# Patient Record
Sex: Female | Born: 1975 | Race: White | Hispanic: No | Marital: Married | Smoking: Never smoker
Health system: Southern US, Community
[De-identification: ages and names within clinical notes are randomized; demographics above are authoritative.]

## PROBLEM LIST (undated history)

## (undated) DIAGNOSIS — L309 Dermatitis, unspecified: Secondary | ICD-10-CM

## (undated) DIAGNOSIS — F329 Major depressive disorder, single episode, unspecified: Secondary | ICD-10-CM

## (undated) DIAGNOSIS — F32A Depression, unspecified: Secondary | ICD-10-CM

## (undated) DIAGNOSIS — M779 Enthesopathy, unspecified: Secondary | ICD-10-CM

## (undated) DIAGNOSIS — L710 Perioral dermatitis: Secondary | ICD-10-CM

## (undated) HISTORY — DX: Dermatitis, unspecified: L30.9

## (undated) HISTORY — DX: Depression, unspecified: F32.A

## (undated) HISTORY — DX: Major depressive disorder, single episode, unspecified: F32.9

## (undated) HISTORY — DX: Enthesopathy, unspecified: M77.9

## (undated) HISTORY — DX: Perioral dermatitis: L71.0

---

## 2006-06-12 ENCOUNTER — Inpatient Hospital Stay: Payer: Self-pay | Admitting: Obstetrics and Gynecology

## 2009-03-11 ENCOUNTER — Ambulatory Visit: Payer: Self-pay | Admitting: Family Medicine

## 2009-03-11 LAB — CONVERTED CEMR LAB
Basophils Absolute: 0 10*3/uL (ref 0.0–0.1)
Eosinophils Absolute: 0.2 10*3/uL (ref 0.0–0.7)
Eosinophils Relative: 2 % (ref 0–5)
HCT: 37.6 % (ref 36.0–46.0)
Hemoglobin: 11.9 g/dL — ABNORMAL LOW (ref 12.0–15.0)
MCV: 93.1 fL (ref 78.0–100.0)
Monocytes Absolute: 0.9 10*3/uL (ref 0.1–1.0)
Monocytes Relative: 10 % (ref 3–12)
Neutro Abs: 7.2 10*3/uL (ref 1.7–7.7)
Neutrophils Relative %: 76 % (ref 43–77)
WBC: 9.5 10*3/uL (ref 4.0–10.5)

## 2009-03-14 ENCOUNTER — Encounter: Payer: Self-pay | Admitting: Obstetrics & Gynecology

## 2009-03-14 ENCOUNTER — Ambulatory Visit: Payer: Self-pay | Admitting: Obstetrics & Gynecology

## 2009-03-15 ENCOUNTER — Encounter: Payer: Self-pay | Admitting: Obstetrics & Gynecology

## 2009-03-15 LAB — CONVERTED CEMR LAB

## 2009-03-18 ENCOUNTER — Ambulatory Visit (HOSPITAL_COMMUNITY): Admission: RE | Admit: 2009-03-18 | Discharge: 2009-03-18 | Payer: Self-pay | Admitting: Family Medicine

## 2009-03-18 ENCOUNTER — Encounter: Payer: Self-pay | Admitting: Family Medicine

## 2009-04-04 ENCOUNTER — Ambulatory Visit: Payer: Self-pay | Admitting: Obstetrics & Gynecology

## 2009-04-23 ENCOUNTER — Encounter: Payer: Self-pay | Admitting: Family Medicine

## 2009-04-23 ENCOUNTER — Ambulatory Visit (HOSPITAL_COMMUNITY): Admission: RE | Admit: 2009-04-23 | Discharge: 2009-04-23 | Payer: Self-pay | Admitting: Obstetrics & Gynecology

## 2009-05-01 ENCOUNTER — Ambulatory Visit: Payer: Self-pay | Admitting: Obstetrics & Gynecology

## 2009-05-20 ENCOUNTER — Ambulatory Visit: Payer: Self-pay | Admitting: Obstetrics and Gynecology

## 2009-05-29 ENCOUNTER — Ambulatory Visit (HOSPITAL_COMMUNITY): Admission: RE | Admit: 2009-05-29 | Discharge: 2009-05-29 | Payer: Self-pay | Admitting: Obstetrics & Gynecology

## 2009-06-12 ENCOUNTER — Ambulatory Visit: Payer: Self-pay | Admitting: Obstetrics & Gynecology

## 2009-07-10 ENCOUNTER — Ambulatory Visit: Payer: Self-pay | Admitting: Family Medicine

## 2009-07-11 ENCOUNTER — Encounter: Payer: Self-pay | Admitting: Family Medicine

## 2009-07-11 LAB — CONVERTED CEMR LAB

## 2009-07-25 ENCOUNTER — Ambulatory Visit (HOSPITAL_COMMUNITY): Admission: RE | Admit: 2009-07-25 | Discharge: 2009-07-25 | Payer: Self-pay | Admitting: Obstetrics & Gynecology

## 2009-08-05 ENCOUNTER — Ambulatory Visit: Payer: Self-pay | Admitting: Obstetrics and Gynecology

## 2009-08-27 ENCOUNTER — Ambulatory Visit: Payer: Self-pay | Admitting: Obstetrics & Gynecology

## 2009-09-09 ENCOUNTER — Ambulatory Visit: Payer: Self-pay | Admitting: Obstetrics and Gynecology

## 2009-09-10 ENCOUNTER — Encounter: Payer: Self-pay | Admitting: Obstetrics and Gynecology

## 2009-09-10 LAB — CONVERTED CEMR LAB: Clue Cells Wet Prep HPF POC: NONE SEEN

## 2009-09-11 ENCOUNTER — Ambulatory Visit (HOSPITAL_COMMUNITY): Admission: RE | Admit: 2009-09-11 | Discharge: 2009-09-11 | Payer: Self-pay | Admitting: Obstetrics and Gynecology

## 2009-09-18 ENCOUNTER — Ambulatory Visit: Payer: Self-pay | Admitting: Obstetrics & Gynecology

## 2009-10-02 ENCOUNTER — Ambulatory Visit: Payer: Self-pay | Admitting: Family Medicine

## 2009-10-03 ENCOUNTER — Encounter: Payer: Self-pay | Admitting: Family Medicine

## 2009-10-03 LAB — CONVERTED CEMR LAB
Chlamydia, DNA Probe: NEGATIVE
GC Probe Amp, Genital: NEGATIVE

## 2009-10-09 ENCOUNTER — Ambulatory Visit: Payer: Self-pay | Admitting: Obstetrics and Gynecology

## 2009-10-16 ENCOUNTER — Ambulatory Visit: Payer: Self-pay | Admitting: Obstetrics & Gynecology

## 2009-10-21 ENCOUNTER — Ambulatory Visit: Payer: Self-pay | Admitting: Family Medicine

## 2009-12-02 ENCOUNTER — Encounter: Payer: Self-pay | Admitting: Family Medicine

## 2009-12-02 ENCOUNTER — Ambulatory Visit: Payer: Self-pay | Admitting: Obstetrics and Gynecology

## 2009-12-02 LAB — CONVERTED CEMR LAB
HCT: 37.3 % (ref 36.0–46.0)
WBC: 6.6 10*3/uL (ref 4.0–10.5)

## 2010-01-15 ENCOUNTER — Encounter: Payer: Self-pay | Admitting: Family Medicine

## 2010-05-29 ENCOUNTER — Inpatient Hospital Stay (HOSPITAL_COMMUNITY): Admission: AD | Admit: 2010-05-29 | Discharge: 2009-10-23 | Payer: Self-pay | Admitting: Obstetrics and Gynecology

## 2010-06-22 DIAGNOSIS — L309 Dermatitis, unspecified: Secondary | ICD-10-CM

## 2010-06-22 HISTORY — DX: Dermatitis, unspecified: L30.9

## 2010-07-13 ENCOUNTER — Encounter: Payer: Self-pay | Admitting: Obstetrics & Gynecology

## 2010-07-14 ENCOUNTER — Encounter: Payer: Self-pay | Admitting: Family Medicine

## 2010-07-22 NOTE — Letter (Signed)
Summary: HEALTH CERTIFICATE  HEALTH CERTIFICATE   Imported By: Lind Guest 01/16/2010 14:24:56  _____________________________________________________________________  External Attachment:    Type:   Image     Comment:   External Document

## 2010-09-09 LAB — CBC
HCT: 29 % — ABNORMAL LOW (ref 36.0–46.0)
Hemoglobin: 9.7 g/dL — ABNORMAL LOW (ref 12.0–15.0)
MCHC: 33.3 g/dL (ref 30.0–36.0)
MCV: 87.2 fL (ref 78.0–100.0)
RBC: 3.32 MIL/uL — ABNORMAL LOW (ref 3.87–5.11)
RDW: 14.5 % (ref 11.5–15.5)
WBC: 14.2 10*3/uL — ABNORMAL HIGH (ref 4.0–10.5)

## 2010-09-09 LAB — RPR: RPR Ser Ql: NONREACTIVE

## 2010-11-04 NOTE — Assessment & Plan Note (Signed)
NAME:  HISAE, DECOURSEY NO.:  [id number]   MEDICAL RECORD NO.:  [id number]          PATIENT TYPE:  POB   LOCATION:  [facility]         FACILITY:  [facility]   PHYSICIAN:  Argentina Donovan, MD        DATE OF BIRTH:  May 27, 1976   DATE OF SERVICE:  12/02/2009                                  CLINIC NOTE   The patient is a 35 year old gravida 2, para 1-1-0-2 Caucasian female  who delivered on Oct 21, 2009, a female infant, full-term, non-complicated  spontaneous delivery, small perineal sulcus tears that had to be  sutured.  The patient is nursing, has a history of past depression but  is feeling well and is not in any antidepressant at this present time.  She was given some oral contraceptives when she left the hospital, but  she has not started them yet.  She is already gotten the prescription  filled, however and she was unsure what they were and I am not sure  whether they gave her pure progesterone or combination pill, so she is  going to check on that and if she has any question, she will call.  I  told her that at this point, she is better off not taking pure  progesterone as she stops nursing, the failure rate goes way up.  She  seems to understand that.  She is going to follow-through.  A Pap smear  was done today and we have told the patient that since she has had so  many normal ones that once every 3 years at this point should be  adequate.   The breasts are lactating, no dominant masses.  No nipple discharge.  Abdomen is soft, flat, nontender.  No masses or organomegaly.  External  genitalia is normal.  BUS within normal limits.  Vagina is clean and  well rugated.  The area I mentioned where the lacerations were are well-  healed.  No sign of scarring.  Cervix is clean, parous.  Pap smear was  taken.  The uterus is well involuted.  Normal size, shape, and  consistency and the adnexa is normal.  The patient today weighs 163, 5  feet 6 inches tall, blood  pressure is 98/60 with pulse of 87.  She is  feeling well postpartum and is doing well.           ______________________________  Argentina Donovan, MD     PR/MEDQ  D:  12/02/2009  T:  12/03/2009  Job:  [id number]

## 2011-04-08 ENCOUNTER — Encounter: Payer: Self-pay | Admitting: Family Medicine

## 2011-04-08 ENCOUNTER — Ambulatory Visit (INDEPENDENT_AMBULATORY_CARE_PROVIDER_SITE_OTHER): Payer: Commercial Indemnity | Admitting: Family Medicine

## 2011-04-08 VITALS — BP 86/50 | HR 96 | Ht 67.0 in | Wt 156.0 lb

## 2011-04-08 DIAGNOSIS — IMO0001 Reserved for inherently not codable concepts without codable children: Secondary | ICD-10-CM

## 2011-04-08 DIAGNOSIS — R350 Frequency of micturition: Secondary | ICD-10-CM

## 2011-04-08 DIAGNOSIS — Z23 Encounter for immunization: Secondary | ICD-10-CM

## 2011-04-08 DIAGNOSIS — Z803 Family history of malignant neoplasm of breast: Secondary | ICD-10-CM | POA: Insufficient documentation

## 2011-04-08 DIAGNOSIS — Z309 Encounter for contraceptive management, unspecified: Secondary | ICD-10-CM

## 2011-04-08 DIAGNOSIS — N912 Amenorrhea, unspecified: Secondary | ICD-10-CM

## 2011-04-08 LAB — POCT URINALYSIS DIPSTICK
Blood, UA: NEGATIVE
Leukocytes, UA: NEGATIVE
Nitrite, UA: NEGATIVE
Protein, UA: NEGATIVE
Urobilinogen, UA: NEGATIVE
pH, UA: NEGATIVE

## 2011-04-08 MED ORDER — NORETHINDRONE-ETH ESTRADIOL 0.4-35 MG-MCG PO TABS: 1.0000 | ORAL_TABLET | Freq: Every day | ORAL | Status: DC

## 2011-04-08 NOTE — Progress Notes (Addendum)
Patient is here today because she has not had a period since August.  She is usually very regular and heavy even with the ocp that she is taking.  She is on some medication that she needs to be sure she is not pregnant, she has had 3 negative home pregnancy tests.  She has also had frequent headaches and increased urination.  She has also had episodes of restless leg which only happens when she is pregnant.  She also has an itchy rash on her inner thigh for two weeks.  She has a Negative upt here today as well as a negative urine dip.

## 2011-04-08 NOTE — Patient Instructions (Signed)
Secondary Amenorrhea  Temporarily Absent Periods Secondary amenorrhea is the stopping of menstrual flow for 3 to 6 months in a female who has previously had periods. There are many possible causes. Most of these causes are not serious. Usually treating the underlying problem causing the loss of menses will return your periods to normal. CAUSES Some common and uncommon causes of not menstruating include:  Malnutrition  Hypoglycemia   Polycystic ovarian disease   Stress or fear.   Breastfeeding   Hormone imbalance   Ovarian failure.   Medications  Extreme obesity   Cystic fibrosis   Drastic weight reduction from any cause or low body weight.   Early menopause   Removal of ovaries or uterus.   Contraceptives  Illness   Long term (chronic) illnesses   Cushing's disease   Thyroid problems   Birth control pills, patches, or vaginal rings for birth control.   DIAGNOSIS This diagnosis is made by your caregiver taking a medical history and doing a physical exam. Pregnancy must be ruled out. Often times, numerous blood tests of different hormones in the body may be measured. Urine testing may be done. Specialized x-rays may have to be done as well as measuring the Body Mass Index (BMI). If it is less than 20, the hypothalamus may be the problem. If it is greater than 30, the cause may be diabetes mellitus.  TREATMENT Treatment depends on the cause of the amenorrhea. If an eating disorder is present, this can be treated with an adequate diet and therapy. Chronic illnesses may improve with treatment of the illness. Overall, the outlook is good, and the amenorrhea may be corrected with medications, lifestyle changes, or surgery. If the amenorrhea can not be corrected, it is sometimes possible to create a false menstruation with medications which help women feel more normal. Should emotional problems occur, your caregiver will be able to help you.  Document Released: 07/20/2006  Document Re-Released: 09/02/2009 ExitCare Patient Information 2011 [employer]. 

## 2011-04-08 NOTE — Progress Notes (Signed)
  Subjective:    Patient ID: Tammy Stout, female    DOB: 22-Sep-1975, 35 y.o.   MRN: [id number]  HPI Has been on OC's x > 1 yr.  Very low dose.  No cycle x 2 mos.  Neg UPT at home.   Review of Systems  Constitutional: Negative for activity change, appetite change and fatigue.  Genitourinary: Positive for menstrual problem. Negative for hematuria, flank pain and pelvic pain.  Musculoskeletal: Negative for arthralgias.       Objective:   Physical Exam  Vitals reviewed. Constitutional: She appears well-developed and well-nourished.  HENT:  Head: Normocephalic and atraumatic.  Cardiovascular: Normal rate.   Pulmonary/Chest: Effort normal.  Abdominal: Soft.  Genitourinary: Vagina normal and uterus normal. No vaginal discharge found.          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  Will increase dose of OC's.  Return in 3 mos. If no cycle. Family h/o breast cancer in her mother-age 30--needs screening.

## 2011-04-09 LAB — TSH: TSH: 1.929 u[IU]/mL (ref 0.350–4.500)

## 2011-04-10 ENCOUNTER — Telehealth: Payer: Self-pay | Admitting: *Deleted

## 2011-04-10 NOTE — Telephone Encounter (Signed)
Message copied by Barbara Cower on Fri Apr 10, 2011  8:14 AM ------      Message from: Reva Bores      Created: Thu Apr 09, 2011  9:13 AM       TSH is nml, please call pt. With results.

## 2011-04-10 NOTE — Telephone Encounter (Signed)
Advised patient of normal results

## 2012-06-25 ENCOUNTER — Ambulatory Visit: Payer: Self-pay | Admitting: Internal Medicine

## 2012-06-25 ENCOUNTER — Emergency Department: Payer: Self-pay | Admitting: Emergency Medicine

## 2012-06-25 LAB — CBC
MCV: 89 fL (ref 80–100)
RBC: 4.3 10*6/uL (ref 3.80–5.20)
RDW: 13.4 % (ref 11.5–14.5)

## 2012-06-25 LAB — COMPREHENSIVE METABOLIC PANEL
Alkaline Phosphatase: 87 U/L (ref 50–136)
Anion Gap: 6 — ABNORMAL LOW (ref 7–16)
Chloride: 103 mmol/L (ref 98–107)
Creatinine: 0.67 mg/dL (ref 0.60–1.30)
EGFR (African American): >60
Glucose: 87 mg/dL (ref 65–99)

## 2012-06-25 LAB — URINALYSIS, COMPLETE
Bilirubin,UR: NEGATIVE
Blood: NEGATIVE
Ph: 6 (ref 4.5–8.0)
WBC UR: 2 /HPF (ref 0–5)

## 2012-06-25 LAB — PREGNANCY, URINE: Pregnancy Test, Urine: NEGATIVE m[IU]/mL

## 2012-06-25 LAB — TROPONIN I: Troponin-I: <0.02 ng/mL

## 2012-07-22 LAB — HM PAP SMEAR

## 2012-09-27 ENCOUNTER — Encounter: Payer: Self-pay | Admitting: Family Medicine

## 2012-09-27 ENCOUNTER — Ambulatory Visit (INDEPENDENT_AMBULATORY_CARE_PROVIDER_SITE_OTHER): Payer: Commercial Indemnity | Admitting: Family Medicine

## 2012-09-27 VITALS — BP 98/60 | HR 104 | Ht 67.0 in | Wt 168.0 lb

## 2012-09-27 DIAGNOSIS — Z309 Encounter for contraceptive management, unspecified: Secondary | ICD-10-CM

## 2012-09-27 DIAGNOSIS — IMO0001 Reserved for inherently not codable concepts without codable children: Secondary | ICD-10-CM | POA: Insufficient documentation

## 2012-09-27 NOTE — Progress Notes (Signed)
Here today to discuss [facility] options.  Currently on oral medication.

## 2012-09-27 NOTE — Progress Notes (Signed)
  Subjective:    Patient ID: Princes Finger, female    DOB: 06-28-1975, 37 y.o.   MRN: [id number]  HPI  On Oc's.  Unhappy with them, because sometimes she does not have cycle.  She is uninterested in having more kids.  She would like to have a hormone free method, as she thinks her weight will be better if not on hormones.   Review of Systems  Constitutional: Negative for fever and chills.  Gastrointestinal: Negative for abdominal pain.  Genitourinary: Negative for dysuria and menstrual problem.       Objective:   Physical Exam  Vitals reviewed. Constitutional: She appears well-developed and well-nourished.  HENT:  Head: Normocephalic and atraumatic.  Eyes: No scleral icterus.  Neck: Neck supple.  Cardiovascular: Normal rate.   Pulmonary/Chest: Effort normal.          Assessment & Plan:

## 2012-09-27 NOTE — Assessment & Plan Note (Signed)
Options reviewed. She would like to pursue Paragard.

## 2012-09-27 NOTE — Patient Instructions (Signed)
Intrauterine Device Information  An intrauterine device (IUD) is inserted into your uterus and prevents pregnancy. There are 2 types of IUDs available:  · Copper IUD. This type of IUD is wrapped in copper wire and is placed inside the uterus. Copper makes the uterus and fallopian tubes produce a fluid that kills sperm. The copper IUD can stay in place for 10 years.  · Hormone IUD. This type of IUD contains the hormone progestin (synthetic progesterone). The hormone thickens the cervical mucus and prevents sperm from entering the uterus, and it also thins the uterine lining to prevent implantation of a fertilized egg. The hormone can weaken or kill the sperm that get into the uterus. The hormone IUD can stay in place for 5 years.  Your caregiver will make sure you are a good candidate for a contraceptive IUD. Discuss with your caregiver the possible side effects.  ADVANTAGES  · It is highly effective, reversible, long-acting, and low maintenance.  · There are no estrogen-related side effects.  · An IUD can be used when breastfeeding.  · It is not associated with weight gain.  · It works immediately after insertion.  · The copper IUD does not interfere with your female hormones.  · The progesterone IUD can make heavy menstrual periods lighter.  · The progesterone IUD can be used for 5 years.  · The copper IUD can be used for 10 years.  DISADVANTAGES  · The progesterone IUD can be associated with irregular bleeding patterns.  · The copper IUD can make your menstrual flow heavier and more painful.  · You may experience cramping and vaginal bleeding after insertion.  Document Released: 05/12/2004 Document Revised: 08/31/2011 Document Reviewed: 10/11/2010  ExitCare® Patient Information ©2013 [employer].

## 2012-10-03 ENCOUNTER — Ambulatory Visit (INDEPENDENT_AMBULATORY_CARE_PROVIDER_SITE_OTHER): Payer: Commercial Indemnity | Admitting: Obstetrics & Gynecology

## 2012-10-03 ENCOUNTER — Encounter: Payer: Self-pay | Admitting: Obstetrics & Gynecology

## 2012-10-03 VITALS — BP 114/76 | HR 111 | Resp 16 | Ht 67.0 in | Wt 167.0 lb

## 2012-10-03 DIAGNOSIS — Z139 Encounter for screening, unspecified: Secondary | ICD-10-CM

## 2012-10-03 DIAGNOSIS — Z30430 Encounter for insertion of intrauterine contraceptive device: Secondary | ICD-10-CM

## 2012-10-03 DIAGNOSIS — Z01812 Encounter for preprocedural laboratory examination: Secondary | ICD-10-CM

## 2012-10-03 DIAGNOSIS — Z975 Presence of (intrauterine) contraceptive device: Secondary | ICD-10-CM

## 2012-10-03 NOTE — Progress Notes (Signed)
  Subjective:    Patient ID: Tammy Stout, female    DOB: [birth year], 37 y.o.   MRN: [id number]  HPI  Tammy Stout is a 37 yo P2 who believes that she is finished with child-bearing. She would like a non-hormonal form of birth control. She is here today for insertion of a Paraguard. She is aware that her period may be heavy.  Review of Systems     Objective:   Physical Exam   UPT negative, consent signed, Time out procedure done. Cervix prepped with betadine and grasped with a single tooth tenaculum. A bimanual exam revealed a NSSA and mobile uterus. Paraguard IUD was easily placed and the strings were cut to 3-4 cm. Uterus sounded to 8 cm. She tolerated the procedure well.       Assessment & Plan:

## 2012-11-04 ENCOUNTER — Ambulatory Visit: Payer: Commercial Indemnity | Admitting: Obstetrics & Gynecology

## 2013-12-22 ENCOUNTER — Ambulatory Visit: Payer: Self-pay | Admitting: Internal Medicine

## 2013-12-22 LAB — RAPID STREP-A WITH REFLX: Micro Text Report: POSITIVE

## 2014-04-23 ENCOUNTER — Encounter: Payer: Self-pay | Admitting: Obstetrics & Gynecology

## 2014-12-03 ENCOUNTER — Telehealth: Payer: Self-pay | Admitting: Family Medicine

## 2014-12-03 NOTE — Telephone Encounter (Signed)
Pt's husband called stated his insurance has changed all RX"s need to be written for 3 month supplies and sent to [facility]. Please call pt with any issues or concerns @ [phone]. Thanks.

## 2014-12-04 MED ORDER — DESOGESTREL-ETHINYL ESTRADIOL 0.15-0.02/0.01 MG (21/5) PO TABS: 1.0000 | ORAL_TABLET | Freq: Every day | ORAL | Status: DC

## 2014-12-04 MED ORDER — DULOXETINE HCL 60 MG PO CPEP: 60.0000 mg | ORAL_CAPSULE | Freq: Every day | ORAL | Status: DC

## 2015-02-11 ENCOUNTER — Other Ambulatory Visit: Payer: Self-pay | Admitting: Family Medicine

## 2015-02-11 NOTE — Telephone Encounter (Signed)
Patient states she is having yeast infection symptoms, itching and discharge. States she had a rx with refills on it but it expired.

## 2015-02-11 NOTE — Telephone Encounter (Signed)
We don't usually just refill fluconazole Please get some symptoms, find out what's going on, does she need an appointment

## 2015-06-08 ENCOUNTER — Ambulatory Visit
Admission: EM | Admit: 2015-06-08 | Discharge: 2015-06-08 | Disposition: A | Discharge status: Home / Self Care | Payer: Commercial Indemnity | Attending: Internal Medicine | Admitting: Internal Medicine

## 2015-06-08 DIAGNOSIS — J020 Streptococcal pharyngitis: Secondary | ICD-10-CM

## 2015-06-08 LAB — RAPID STREP SCREEN (MED CTR MEBANE ONLY): STREPTOCOCCUS, GROUP A SCREEN (DIRECT): POSITIVE — AB

## 2015-06-08 MED ORDER — PENICILLIN G BENZATHINE 1200000 UNIT/2ML IM SUSP
1.2000 10*6.[IU] | Freq: Once | INTRAMUSCULAR | Status: AC
  Administered 2015-06-08: 1.2 10*6.[IU] via INTRAMUSCULAR

## 2015-06-08 NOTE — ED Provider Notes (Signed)
CSN: [id number]     Arrival date & time 06/08/15  7829 History   First MD Initiated Contact with Patient 06/08/15 0914     Chief Complaint  Patient presents with  . Sore Throat    Started last p.m. with sore throat, fever. When she got chilled, her hands went numb bilaterally x 2 hours. Happened again this a.m. Headache comes and goes 5/10   HPI  Patient is a 39 year old lady with the abrupt onset of bad sore throat and fever last night, headache. During episodes of fever, she has had some odd sensory symptoms with her hands, felt like she couldn't feel anything, which has currently resolved. She has good range of motion and good sensation in her hands presently. She has had a flu shot this year. Minimal stuffy nose, no runny nose. No cough. Little nausea last night. No vomiting, no diarrhea. She is achy. Has had a temperature up to 102. Her son felt bad for a day or 2 earlier in the week, but this is resolved. Son is age 26.  Past Medical History  Diagnosis Date  . Depression   . Dermatitis 2012   Past Surgical History  Procedure Laterality Date  . Cesarean section  05/2006    SECTION DUE TO NO DIALATIONA AND CORD WAS AROUND BABY NECK   Family History  Problem Relation Age of Onset  . Cancer Paternal Grandmother 44    kidney cancer  . Cancer Maternal Grandmother 80    lung cancer  . Diabetes Maternal Grandmother   . Diabetes Mother   . Cancer Mother 4    One breast  . Thyroid disease Father   . Thyroid disease Maternal Aunt    Social History  Substance Use Topics  . Smoking status: Never Smoker   . Smokeless tobacco: Never Used  . Alcohol Use: Yes     Comment: social   OB History    Gravida Para Term Preterm AB TAB SAB Ectopic Multiple Living   Review of Systems  All other systems reviewed and are negative.   Allergies  Review of patient's allergies indicates no known allergies.  Home Medications   Prior to Admission medications   Medication  Sig Start Date End Date Taking? Authorizing Provider  Cetirizine HCl (ZYRTEC PO) Take by mouth.      Historical Provider, MD  desogestrel-ethinyl estradiol (AZURETTE) 0.15-0.02/0.01 MG (21/5) tablet Take 1 tablet by mouth daily. 12/04/14   Kerman Passey, MD  DULoxetine (CYMBALTA) 60 MG capsule Take 1 capsule (60 mg total) by mouth daily. 12/04/14   Kerman Passey, MD  fluconazole (DIFLUCAN) 150 MG tablet TAKE ONE TABLET BY MOUTH ONCE DAILY AS NEEDED AS DIRECTED BY PHYSICIAN. 02/11/15   Kerman Passey, MD   Meds Ordered and Administered this Visit   Medications  penicillin g benzathine (BICILLIN LA) 1200000 UNIT/2ML injection 1.2 Million Units (1.2 Million Units Intramuscular Given 06/08/15 0953)    BP 127/54 mmHg  Pulse 126  Temp(Src) 99.5 F (37.5 C) (Tympanic)  Resp 22  Ht  (1.702 m)  Wt 169 lb (76.658 kg)  BMI 26.46 kg/m2  SpO2 98%  LMP 06/01/2015   Physical Exam  Constitutional: She is oriented to person, place, and time.  Alert, nicely groomed Sitting up in bedside chair; looks ill but not toxic  HENT:  Head: Atraumatic.  Bilateral TMs are flushed pink, no dullness Moderately  nasal congestion Throat is red with slightly prominent tonsils, scant exudate  Eyes:  Conjugate gaze, no eye redness/drainage  Neck: Neck supple.  Cardiovascular: Regular rhythm.   Tachycardic, heart rate 120s  Pulmonary/Chest: No respiratory distress. She has no wheezes. She has no rales.  Lungs clear, symmetric breath sounds  Abdominal: She exhibits no distension.  Musculoskeletal: Normal range of motion. She exhibits no edema.  No leg swelling  Neurological: She is alert and oriented to person, place, and time.  Skin: Skin is warm and dry.  Pink. No cyanosis  Nursing note and vitals reviewed.   ED Course  Procedures (including critical care time)  Labs Review Labs Reviewed  RAPID STREP SCREEN (NOT AT [facility]) - Abnormal; Notable for the following:    Streptococcus, Group A Screen  (Direct) POSITIVE (*)    All other components within normal limits       MDM   1. Streptococcal pharyngitis    Treated at urgent care with an injection of 1.2 million units of long-acting penicillin G. Recheck or follow up PCP, Dr. Sherie DonLada, if not improving as expected, in the next 24-48 hours.    Eustace MooreLaura W Allicia Culley, MD 06/12/15 [phone]

## 2015-06-08 NOTE — Discharge Instructions (Signed)
Strep test at the urgent care today was positive. You were treated with an injection of long-acting penicillin G, 1.2 million units. No further treatment is required.  Anticipate improvement in fever and achiness, sore throat, in the next 24-48 hours. Recheck or follow up with PCP, Dr. Sherie DonLada, if not improving as expected.

## 2015-06-20 DIAGNOSIS — F32A Depression, unspecified: Secondary | ICD-10-CM | POA: Insufficient documentation

## 2015-06-20 DIAGNOSIS — L710 Perioral dermatitis: Secondary | ICD-10-CM | POA: Insufficient documentation

## 2015-06-20 DIAGNOSIS — F329 Major depressive disorder, single episode, unspecified: Secondary | ICD-10-CM | POA: Insufficient documentation

## 2015-06-20 DIAGNOSIS — L309 Dermatitis, unspecified: Secondary | ICD-10-CM | POA: Insufficient documentation

## 2015-06-21 ENCOUNTER — Ambulatory Visit (INDEPENDENT_AMBULATORY_CARE_PROVIDER_SITE_OTHER): Payer: Managed Care, Other (non HMO) | Admitting: Family Medicine

## 2015-06-21 ENCOUNTER — Encounter: Payer: Self-pay | Admitting: Family Medicine

## 2015-06-21 VITALS — BP 111/74 | HR 112 | Temp 98.2°F | Ht 64.5 in | Wt 169.0 lb

## 2015-06-21 DIAGNOSIS — R198 Other specified symptoms and signs involving the digestive system and abdomen: Secondary | ICD-10-CM

## 2015-06-21 DIAGNOSIS — R6889 Other general symptoms and signs: Secondary | ICD-10-CM

## 2015-06-21 DIAGNOSIS — J0100 Acute maxillary sinusitis, unspecified: Secondary | ICD-10-CM | POA: Diagnosis not present

## 2015-06-21 DIAGNOSIS — R000 Tachycardia, unspecified: Secondary | ICD-10-CM

## 2015-06-21 MED ORDER — AMOXICILLIN-POT CLAVULANATE 875-125 MG PO TABS: 1.0000 | ORAL_TABLET | Freq: Two times a day (BID) | ORAL | Status: DC

## 2015-06-21 MED ORDER — FLUCONAZOLE 150 MG PO TABS: 150.0000 mg | ORAL_TABLET | Freq: Once | ORAL | Status: DC

## 2015-06-21 NOTE — Assessment & Plan Note (Signed)
Heart rate down around 100 during visit; suspect caffeine, being sick, decongestant use; advised her to NOT combine any decongestants especially (don't take Tylenol sinus and then turn around and take Dayquil, e.g.); recommended she avoid the decongestants completely if possible

## 2015-06-21 NOTE — Progress Notes (Signed)
BP 111/74 mmHg  Pulse 112  Temp(Src) 98.2 F (36.8 C)  Ht 5' 4.5" (1.638 m)  Wt 169 lb (76.658 kg)  BMI 28.57 kg/m2  SpO2 97%  LMP 06/01/2015 (Approximate)   Subjective:    Patient ID: Tammy Stout, female    DOB: 01-Oct-1975, 39 y.o.   MRN: [id number]  HPI: Tammy Stout is a 39 y.o. female  Chief Complaint  Patient presents with  . Sinusitis    head congestion, sinus pain x 1 week. No ear ache, no sore throat, no fever.   She has had a head cold for about a week; no travel; head congestion, blowing out yellow stuff; pain behind the cheeks; a little pain along the upper teeth; no ear symptoms; having some headaches, no fevers; no decongestants today, but she did take some yesterday and the last few days Does get occasional sinus infections No rash, no GI symptoms  She had an itchy mouth upper soft palate and there is a bump on the back of the mouth, she thinks it is supposed to be there, but that was where she had all the itching; she can feel something hard there; on the left side, "it's odd"; been there for about two months  Relevant past medical, surgical, family and social history reviewed and updated as indicated. Interim medical history since our last visit reviewed. Allergies and medications reviewed and updated.  Review of Systems Per HPI unless specifically indicated above     Objective:    BP 111/74 mmHg  Pulse 112  Temp(Src) 98.2 F (36.8 C)  Ht 5' 4.5" (1.638 m)  Wt 169 lb (76.658 kg)  BMI 28.57 kg/m2  SpO2 97%  LMP 06/01/2015 (Approximate)  Wt Readings from Last 3 Encounters:  06/21/15 169 lb (76.658 kg)  10/16/14 169 lb (76.658 kg)  06/08/15 169 lb (76.658 kg)    Physical Exam  Constitutional: She appears well-developed and well-nourished.  HENT:  Right Ear: External ear normal.  Left Ear: External ear normal.  Nose: Mucosal edema and rhinorrhea (cloudy, yellow) present. Right sinus exhibits maxillary sinus tenderness and frontal sinus  tenderness. Left sinus exhibits maxillary sinus tenderness and frontal sinus tenderness.  Mouth/Throat: Posterior oropharyngeal edema present. No oropharyngeal exudate.  Slight discoloration of soft palate left of the midline; I did not palpate the area  Eyes: EOM are normal. Right eye exhibits no discharge. Left eye exhibits no discharge. No scleral icterus.  Cardiovascular: Regular rhythm.  Tachycardia present.   Borderline, right around 100 at time of auscultation  Lymphadenopathy:    She has no cervical adenopathy.  Psychiatric: She has a normal mood and affect.    Results for orders placed or performed in visit on 06/20/15  HM PAP SMEAR  Result Value Ref Range   HM Pap smear per PP       Assessment & Plan:   Problem List Items Addressed This Visit      Other   Tachycardia    Heart rate down around 100 during visit; suspect caffeine, being sick, decongestant use; advised her to NOT combine any decongestants especially (don't take Tylenol sinus and then turn around and take Dayquil, e.g.); recommended she avoid the decongestants completely if possible       Other Visit Diagnoses    Acute maxillary sinusitis, recurrence not specified    -  Primary    rest, hydration, vit C, start antibiotics; since 2nd round of ABX this month, warned about C diff; see AVS  Relevant Medications    amoxicillin-clavulanate (AUGMENTIN) 875-125 MG tablet    fluconazole (DIFLUCAN) 150 MG tablet    Throat and mouth symptom        abnormal hard area on soft palate, present x 2 months; refer to ENT    Relevant Orders    Ambulatory referral to ENT        Follow up plan: Return if symptoms worsen or fail to improve.  An after-visit summary was printed and given to the patient at check-out.  Please see the patient instructions which may contain other information and recommendations beyond what is mentioned above in the assessment and plan. Meds ordered this encounter  Medications  . IUD'S IU     Sig: by Intrauterine route.  Marland Kitchen. amoxicillin-clavulanate (AUGMENTIN) 875-125 MG tablet    Sig: Take 1 tablet by mouth 2 (two) times daily.    Dispense:  20 tablet    Refill:  0  . fluconazole (DIFLUCAN) 150 MG tablet    Sig: Take 1 tablet (150 mg total) by mouth once. If needed    Dispense:  1 tablet    Refill:  0   Orders Placed This Encounter  Procedures  . Ambulatory referral to ENT

## 2015-06-21 NOTE — Patient Instructions (Signed)
Start the antibiotics Use the fluconzazole if needed Try to use PLAIN allergy medicine without the decongestant Avoid: phenylephrine, phenylpropanolamine, and pseudoephredine Please do eat yogurt daily or take a probiotic daily for the next month or two We want to replace the healthy germs in the gut If you notice foul, watery diarrhea in the next two months, schedule an appointment RIGHT AWAY We'll refer you to the ENT specialist Try vitamin C (orange juice if not diabetic or vitamin C tablets) and drink green tea to help your immune system during your illness Get plenty of rest and hydration  If you have not heard anything from my staff in a week about any orders/referrals/studies from today, please contact us here to follow-up [phone]

## 2015-09-11 ENCOUNTER — Ambulatory Visit
Admission: EM | Admit: 2015-09-11 | Discharge: 2015-09-11 | Disposition: A | Discharge status: Home / Self Care | Payer: Commercial Indemnity | Attending: Family Medicine | Admitting: Family Medicine

## 2015-09-11 ENCOUNTER — Encounter: Payer: Self-pay | Admitting: *Deleted

## 2015-09-11 DIAGNOSIS — J301 Allergic rhinitis due to pollen: Secondary | ICD-10-CM

## 2015-09-11 DIAGNOSIS — H6593 Unspecified nonsuppurative otitis media, bilateral: Secondary | ICD-10-CM | POA: Diagnosis not present

## 2015-09-11 DIAGNOSIS — J4531 Mild persistent asthma with (acute) exacerbation: Secondary | ICD-10-CM | POA: Diagnosis not present

## 2015-09-11 MED ORDER — SALINE SPRAY 0.65 % NA SOLN: 2.0000 | NASAL | Status: DC

## 2015-09-11 MED ORDER — FLUTICASONE PROPIONATE 50 MCG/ACT NA SUSP: 1.0000 | Freq: Two times a day (BID) | NASAL | Status: DC

## 2015-09-11 MED ORDER — FLUCONAZOLE 150 MG PO TABS: 150.0000 mg | ORAL_TABLET | Freq: Every day | ORAL | Status: AC

## 2015-09-11 MED ORDER — MONTELUKAST SODIUM 10 MG PO TABS: 10.0000 mg | ORAL_TABLET | Freq: Every day | ORAL | Status: DC

## 2015-09-11 MED ORDER — PREDNISONE 20 MG PO TABS: 40.0000 mg | ORAL_TABLET | Freq: Every day | ORAL | Status: DC

## 2015-09-11 MED ORDER — ALBUTEROL SULFATE HFA 108 (90 BASE) MCG/ACT IN AERS: 1.0000 | INHALATION_SPRAY | RESPIRATORY_TRACT | Status: DC | PRN

## 2015-09-11 MED ORDER — AMOXICILLIN-POT CLAVULANATE 875-125 MG PO TABS: 1.0000 | ORAL_TABLET | Freq: Two times a day (BID) | ORAL | Status: DC

## 2015-09-11 NOTE — ED Provider Notes (Signed)
CSN: [id number]     Arrival date & time 09/11/15  1429 History   First MD Initiated Contact with Patient 09/11/15 1559     Chief Complaint  Patient presents with  . Breathing Problem   (Consider location/radiation/quality/duration/timing/severity/associated sxs/prior Treatment) HPI Comments: Married caucasian female here for evaluation of worsening allergy symptoms history asthma as child has not used inhaler in years but wheezing, short of breath, itching in throat accidentally scratched inside of her mouth due to itching taking benadryl 3-4 times a day and zyrtec without relief of symptoms.  No longer has inhaler at home needs refill.  Denied feeling like throat closing up, tongue swelling, rash on body/hives, fever, chills, nausea, vomiting, gi distress or choking  Patient is a 40 y.o. female presenting with difficulty breathing. The history is provided by the patient.  Breathing Problem This is a recurrent problem. The current episode started 2 days ago. The problem occurs constantly. The problem has been gradually worsening. Associated symptoms include headaches and shortness of breath. Pertinent negatives include no chest pain and no abdominal pain. The symptoms are aggravated by exertion. Nothing relieves the symptoms. She has tried rest, food and water (benadryl QID and zyrtec daily) for the symptoms. The treatment provided mild relief.    Past Medical History  Diagnosis Date  . Dermatitis 2012  . Depression   . Eczema   . Perioral dermatitis    Past Surgical History  Procedure Laterality Date  . Cesarean section  05/2006    SECTION DUE TO NO DIALATIONA AND CORD WAS AROUND BABY NECK   Family History  Problem Relation Age of Onset  . Cancer Paternal Grandmother 65    kidney cancer  . Cancer Maternal Grandmother 80    lung cancer  . Diabetes Maternal Grandmother   . Diabetes Mother   . Cancer Mother 83    One breast  . Thyroid disease Father   . Hyperlipidemia Father   .  Thyroid disease Maternal Aunt    Social History  Substance Use Topics  . Smoking status: Never Smoker   . Smokeless tobacco: Never Used  . Alcohol Use: Yes     Comment: social   OB History    Gravida Para Term Preterm AB TAB SAB Ectopic Multiple Living   Review of Systems  Constitutional: Negative for fever, chills, diaphoresis, activity change, appetite change, fatigue and unexpected weight change.  HENT: Positive for congestion, ear pain, postnasal drip, rhinorrhea, sinus pressure and sneezing. Negative for dental problem, drooling, ear discharge, facial swelling, hearing loss, mouth sores, nosebleeds, sore throat, tinnitus, trouble swallowing and voice change.   Eyes: Negative for photophobia, pain, discharge, redness, itching and visual disturbance.  Respiratory: Positive for chest tightness, shortness of breath and wheezing. Negative for cough, choking and stridor.   Cardiovascular: Negative for chest pain, palpitations and leg swelling.  Gastrointestinal: Negative for nausea, vomiting, abdominal pain, diarrhea, constipation, blood in stool and abdominal distention.  Endocrine: Negative for cold intolerance and heat intolerance.  Genitourinary: Negative for dysuria, hematuria and difficulty urinating.  Musculoskeletal: Negative for myalgias, back pain, joint swelling, arthralgias, gait problem, neck pain and neck stiffness.  Skin: Negative for color change, pallor, rash and wound.  Allergic/Immunologic: Positive for environmental allergies. Negative for food allergies.  Neurological: Positive for headaches. Negative for dizziness, tremors, seizures, syncope, facial asymmetry, speech difficulty, weakness, light-headedness and numbness.  Hematological: Negative for adenopathy. Does not  bruise/bleed easily.  Psychiatric/Behavioral: Negative for behavioral problems, confusion, sleep disturbance and agitation.    Allergies  Review of patient's allergies indicates no  known allergies.  Home Medications   Prior to Admission medications   Medication Sig Start Date End Date Taking? Authorizing Provider  Cetirizine HCl (ZYRTEC PO) Take 10 mg by mouth daily.    Yes Historical Provider, MD  DULoxetine (CYMBALTA) 60 MG capsule Take 1 capsule (60 mg total) by mouth daily. 12/04/14  Yes Kerman Passey, MD  albuterol (PROVENTIL HFA;VENTOLIN HFA) 108 (90 Base) MCG/ACT inhaler Inhale 1-2 puffs into the lungs every 4 (four) hours as needed for wheezing or shortness of breath. 09/11/15   Barbaraann Barthel, NP  amoxicillin-clavulanate (AUGMENTIN) 875-125 MG tablet Take 1 tablet by mouth every 12 (twelve) hours. 09/11/15   Barbaraann Barthel, NP  fluconazole (DIFLUCAN) 150 MG tablet Take 1 tablet (150 mg total) by mouth daily. 09/16/15 09/16/15  Barbaraann Barthel, NP  fluticasone (FLONASE) 50 MCG/ACT nasal spray Place 1 spray into both nostrils 2 (two) times daily. 09/11/15   Barbaraann Barthel, NP  IUD'S IU by Intrauterine route.    Historical Provider, MD  montelukast (SINGULAIR) 10 MG tablet Take 1 tablet (10 mg total) by mouth at bedtime. 09/11/15   Barbaraann Barthel, NP  predniSONE (DELTASONE) 20 MG tablet Take 2 tablets (40 mg total) by mouth daily with breakfast. 09/11/15   Barbaraann Barthel, NP  sodium chloride (OCEAN) 0.65 % SOLN nasal spray Place 2 sprays into both nostrils every 2 (two) hours while awake. 09/11/15 09/18/15  Barbaraann Barthel, NP   Meds Ordered and Administered this Visit  Medications - No data to display  BP 106/47 mmHg  Pulse 119  Temp(Src) 99.3 F (37.4 C) (Oral)  Resp 18  Ht  (1.702 m)  Wt 170 lb (77.111 kg)  BMI 26.62 kg/m2  SpO2 97%  LMP 09/11/2015 No data found.   Physical Exam  Constitutional: She is oriented to person, place, and time. She appears well-developed and well-nourished. She is active and cooperative.  Non-toxic appearance. She does not have a sickly appearance. She appears ill. No distress.  HENT:  Head: Normocephalic  and atraumatic.  Right Ear: Hearing, external ear and ear canal normal. A middle ear effusion is present.  Left Ear: Hearing, external ear and ear canal normal. A middle ear effusion is present.  Nose: Mucosal edema and rhinorrhea present. No nose lacerations, sinus tenderness, nasal deformity, septal deviation or nasal septal hematoma. No epistaxis.  No foreign bodies. Right sinus exhibits no maxillary sinus tenderness and no frontal sinus tenderness. Left sinus exhibits no maxillary sinus tenderness and no frontal sinus tenderness.  Mouth/Throat: Uvula is midline and mucous membranes are normal. Mucous membranes are not pale, not dry and not cyanotic. She does not have dentures. No oral lesions. No trismus in the jaw. Normal dentition. No dental abscesses, uvula swelling, lacerations or dental caries. Posterior oropharyngeal edema and posterior oropharyngeal erythema present. No oropharyngeal exudate or tonsillar abscesses.    Cobblestoning posterior pharynx; bilateral TMs with air fluid level slight opacity; vasculature inflamed; bilateral nasal turbinates with edema/erythema/clear discharge; macular erythema oropharynx; nasal congestion clear discharge  Eyes: Conjunctivae, EOM and lids are normal. Pupils are equal, round, and reactive to light. Right eye exhibits no chemosis, no discharge, no exudate and no hordeolum. No foreign body present in the right eye. Left eye exhibits no chemosis, no discharge, no exudate and no hordeolum. No foreign  body present in the left eye. Right conjunctiva is not injected. Right conjunctiva has no hemorrhage. Left conjunctiva is not injected. Left conjunctiva has no hemorrhage. No scleral icterus. Right eye exhibits normal extraocular motion and no nystagmus. Left eye exhibits normal extraocular motion and no nystagmus. Right pupil is round and reactive. Left pupil is round and reactive. Pupils are equal.  Neck: Trachea normal and normal range of motion. Neck supple. No  tracheal tenderness, no spinous process tenderness and no muscular tenderness present. No rigidity. No tracheal deviation, no edema, no erythema and normal range of motion present. No thyroid mass and no thyromegaly present.  Cardiovascular: Normal rate, regular rhythm, S1 normal, S2 normal, normal heart sounds and intact distal pulses.  PMI is not displaced.  Exam reveals no gallop and no friction rub.   No murmur heard. Pulmonary/Chest: Effort normal. No accessory muscle usage or stridor. No respiratory distress. She has decreased breath sounds in the right lower field and the left lower field. She has no wheezes. She has no rhonchi. She has no rales. She exhibits no tenderness.  Abdominal: Soft. She exhibits no distension.  Musculoskeletal: Normal range of motion. She exhibits no edema or tenderness.       Right shoulder: Normal.       Left shoulder: Normal.       Right hip: Normal.       Left hip: Normal.       Right knee: Normal.       Left knee: Normal.       Cervical back: Normal.       Right hand: Normal.       Left hand: Normal.  Lymphadenopathy:       Head (right side): No submental, no submandibular, no tonsillar, no preauricular, no posterior auricular and no occipital adenopathy present.       Head (left side): No submental, no submandibular, no tonsillar, no preauricular, no posterior auricular and no occipital adenopathy present.    She has no cervical adenopathy.       Right cervical: No superficial cervical, no deep cervical and no posterior cervical adenopathy present.      Left cervical: No superficial cervical, no deep cervical and no posterior cervical adenopathy present.  Neurological: She is alert and oriented to person, place, and time. She has normal strength. She is not disoriented. She displays no atrophy and no tremor. No cranial nerve deficit or sensory deficit. She exhibits normal muscle tone. She displays no seizure activity. Coordination and gait normal. GCS eye  subscore is 4. GCS verbal subscore is 5. GCS motor subscore is 6.  Skin: Skin is warm, dry and intact. No abrasion, no bruising, no burn, no ecchymosis, no laceration, no lesion, no petechiae and no rash noted. She is not diaphoretic. No cyanosis or erythema. No pallor. Nails show no clubbing.  Psychiatric: She has a normal mood and affect. Her speech is normal and behavior is normal. Judgment and thought content normal. Cognition and memory are normal.  Nursing note and vitals reviewed.   ED Course  Procedures (including critical care time)  Labs Review Labs Reviewed - No data to display  Imaging Review No results found.  1610 discussed in clinic nebulizer and patient refused stated she was fine to just use inhaler at home.  Able to speak in full sentences not coughing with deep inspiration or talking.  Denied sick contacts.  Nasal congestion/voice noted.  Did not want imaging at this time.  OTC  antihistamines not effective and patient here for Rx allergy medication and inhaler Rx.  Discussed proper use of MDI and nasal steroids versus nasal saline administration.  Patient verbalized understanding of information and had no further questions at this time.   MDM   1. Allergic rhinitis due to pollen   2. Acute asthma flare, mild persistent   3. Otitis media with effusion, bilateral   Patient may use normal saline nasal spray as needed.  Start zyrtec 10mg  po daily, flonase 1 spray each nostril BID, nasal saline 2 sprays each nostril q2h prn congestion,  Shower BID,  Do not sleep with windows open at this time use HVAC.  If minimal improvement start singulair 10mg  po daily also.  Consider antihistamine or nasal steroid use.  Avoid triggers if possible.  Shower prior to bedtime if exposed to triggers.  If allergic dust/dust mites recommend mattress/pillow covers/encasements; washing linens, vacuuming, sweeping, dusting weekly.  Call or return to clinic as needed if these symptoms worsen or fail to  improve as anticipated.   Exitcare handout on allergic rhinitis given to patient.  Patient verbalized understanding of instructions, agreed with plan of care and had no further questions at this time.  P2:  Avoidance and hand washing.  Medications as directed.  Has not used inhaler in years none at home Rx albuterol 90mcg 1-2 puffs po q4-6h prn protracted cough/wheezing #1 RF0,  Prednisone 40mg  po daily at breakfast x 5 days if no improvement with allergy medications to start prednisone oral in 48 hours.  Discussed with patient prednisone and albuterol can cause hypokalemia discussed sources of potassium bananas, potatoes, gatorade/powerade.  Discussed signs and symptoms of hypokalemia with patient.   Discussed with patient if asthma symptoms not controlled with allergy medications may require starting inhaled steroids again and to follow up for re-evaluation if symptoms not improving or worsening with plan of care.   Patient is to return to the clinic or follow up with PCM if there is increased wheezing or shortness of breath, increased use of albuterol.  Patient given Exitcare handout on asthma exacerbation prevention.  Patient verbalized agreement and understanding of treatment plan.  P2:  Asthma action plan, fluids, and fitness  Supportive treatment.   No evidence of invasive bacterial infection, non toxic and well hydrated.  This is most likely self limiting viral infection.  I do not see where any further testing or imaging is necessary at this time.   I will suggest supportive care, rest, good hygiene and encourage the patient to take adequate fluids.  Rx augmentin 875mg  po BID x 10 days given as cloudy fluid bilateral TMs if fever, discharge ears or worsening sinus symptoms despite plan of care start augmentin.  Patient has history of vaginal yeast with po antibiotics diflucan 150mg  po x 1 typically clears Rx given.  The patient is to return to clinic or EMERGENCY ROOM if symptoms worsen or change  significantly e.g. ear pain, fever, purulent discharge from ears or bleeding.  Exitcare handout on otitis media with effusion given to patient.  Patient verbalized agreement and understanding of treatment plan.      Barbaraann Barthelina A Jamarrius Salay, NP 09/12/15 [phone]

## 2015-09-11 NOTE — Discharge Instructions (Signed)
Allergic Rhinitis Allergic rhinitis is when the mucous membranes in the nose respond to allergens. Allergens are particles in the air that cause your body to have an allergic reaction. This causes you to release allergic antibodies. Through a chain of events, these eventually cause you to release histamine into the blood stream. Although meant to protect the body, it is this release of histamine that causes your discomfort, such as frequent sneezing, congestion, and an itchy, runny nose.  CAUSES Seasonal allergic rhinitis (hay fever) is caused by pollen allergens that may come from grasses, trees, and weeds. Year-round allergic rhinitis (perennial allergic rhinitis) is caused by allergens such as house dust mites, pet dander, and mold spores. SYMPTOMS  Nasal stuffiness (congestion).  Itchy, runny nose with sneezing and tearing of the eyes. DIAGNOSIS Your health care provider can help you determine the allergen or allergens that trigger your symptoms. If you and your health care provider are unable to determine the allergen, skin or blood testing may be used. Your health care provider will diagnose your condition after taking your health history and performing a physical exam. Your health care provider may assess you for other related conditions, such as asthma, pink eye, or an ear infection. TREATMENT Allergic rhinitis does not have a cure, but it can be controlled by:  Medicines that block allergy symptoms. These may include allergy shots, nasal sprays, and oral antihistamines.  Avoiding the allergen. Hay fever may often be treated with antihistamines in pill or nasal spray forms. Antihistamines block the effects of histamine. There are over-the-counter medicines that may help with nasal congestion and swelling around the eyes. Check with your health care provider before taking or giving this medicine. If avoiding the allergen or the medicine prescribed do not work, there are many new medicines  your health care provider can prescribe. Stronger medicine may be used if initial measures are ineffective. Desensitizing injections can be used if medicine and avoidance does not work. Desensitization is when a patient is given ongoing shots until the body becomes less sensitive to the allergen. Make sure you follow up with your health care provider if problems continue. HOME CARE INSTRUCTIONS It is not possible to completely avoid allergens, but you can reduce your symptoms by taking steps to limit your exposure to them. It helps to know exactly what you are allergic to so that you can avoid your specific triggers. SEEK MEDICAL CARE IF:  You have a fever.  You develop a cough that does not stop easily (persistent).  You have shortness of breath.  You start wheezing.  Symptoms interfere with normal daily activities.   This information is not intended to replace advice given to you by your health care provider. Make sure you discuss any questions you have with your health care provider.   Document Released: 03/03/2001 Document Revised: 06/29/2014 Document Reviewed: 02/13/2013 Elsevier Interactive Patient Education 2016 [place] Prevention While you may not be able to control the fact that you have asthma, you can take actions to prevent asthma attacks. The best way to prevent asthma attacks is to maintain good control of your asthma. You can achieve this by:  Taking your medicines as directed.  Avoiding things that can irritate your airways or make your asthma symptoms worse (asthma triggers).  Keeping track of how well your asthma is controlled and of any changes in your symptoms.  Responding quickly to worsening asthma symptoms (asthma attack).  Seeking emergency care when it is needed. WHAT  ARE SOME WAYS TO PREVENT AN ASTHMA ATTACK? Have a Plan Work with your health care provider to create a written plan for managing and treating your asthma attacks (asthma  action plan). This plan includes:  A list of your asthma triggers and how you can avoid them.  Information on when medicines should be taken and when their dosages should be changed.  The use of a device that measures how well your lungs are working (peak flow meter). Monitor Your Asthma Use your peak flow meter and record your results in a journal every day. A drop in your peak flow numbers on one or more days may indicate the start of an asthma attack. This can happen even before you start to feel symptoms. You can prevent an asthma attack from getting worse by following the steps in your asthma action plan. Avoid Asthma Triggers Work with your asthma health care provider to find out what your asthma triggers are. This can be done by:  Allergy testing.  Keeping a journal that notes when asthma attacks occur and the factors that may have contributed to them.  Determining if there are other medical conditions that are making your asthma worse. Once you have determined your asthma triggers, take steps to avoid them. This may include avoiding excessive or prolonged exposure to:  Dust. Have someone dust and vacuum your home for you once or twice a week. Using a high-efficiency particulate arrestance (HEPA) vacuum is best.  Smoke. This includes campfire smoke, forest fire smoke, and secondhand smoke from tobacco products.  Pet dander. Avoid contact with animals that you know you are allergic to.  Allergens from trees, grasses or pollens. Avoid spending a lot of time outdoors when pollen counts are high, and on very windy days.  Very cold, dry, or humid air.  Mold.  Foods that contain high amounts of sulfites.  Strong odors.  Outdoor air pollutants, such as Lexicographer.  Indoor air pollutants, such as aerosol sprays and fumes from household cleaners.  Household pests, including dust mites and cockroaches, and pest droppings.  Certain medicines, including NSAIDs. Always talk to  your health care provider before stopping or starting any new medicines. Medicines Take over-the-counter and prescription medicines only as told by your health care provider. Many asthma attacks can be prevented by carefully following your medicine schedule. Taking your medicines correctly is especially important when you cannot avoid certain asthma triggers. Act Quickly If an asthma attack does happen, acting quickly can decrease how severe it is and how long it lasts. Take these steps:   Pay attention to your symptoms. If you are coughing, wheezing, or having difficulty breathing, do not wait to see if your symptoms go away on their own. Follow your asthma action plan.  If you have followed your asthma action plan and your symptoms are not improving, call your health care provider or seek immediate medical care at the nearest hospital. It is important to note how often you need to use your fast-acting rescue inhaler. If you are using your rescue inhaler more often, it may mean that your asthma is not under control. Adjusting your asthma treatment plan may help you to prevent future asthma attacks and help you to gain better control of your condition. HOW CAN I PREVENT AN ASTHMA ATTACK WHEN I EXERCISE? Follow advice from your health care provider about whether you should use your fast-acting inhaler before exercising. Many people with asthma experience exercise-induced bronchoconstriction (EIB). This condition often worsens during  vigorous exercise in cold, humid, or dry environments. Usually, people with EIB can stay very active by pre-treating with a fast-acting inhaler before exercising.   This information is not intended to replace advice given to you by your health care provider. Make sure you discuss any questions you have with your health care provider.   Document Released: 05/27/2009 Document Revised: 02/27/2015 Document Reviewed: 11/08/2014 Elsevier Interactive Patient Education 2016  [employer] Sinusitis, Adult Sinusitis is redness, soreness, and inflammation of the paranasal sinuses. Paranasal sinuses are air pockets within the bones of your face. They are located beneath your eyes, in the middle of your forehead, and above your eyes. In healthy paranasal sinuses, mucus is able to drain out, and air is able to circulate through them by way of your nose. However, when your paranasal sinuses are inflamed, mucus and air can become trapped. This can allow bacteria and other germs to grow and cause infection. Sinusitis can develop quickly and last only a short time (acute) or continue over a long period (chronic). Sinusitis that lasts for more than 12 weeks is considered chronic. CAUSES Causes of sinusitis include:  Allergies.  Structural abnormalities, such as displacement of the cartilage that separates your nostrils (deviated septum), which can decrease the air flow through your nose and sinuses and affect sinus drainage.  Functional abnormalities, such as when the small hairs (cilia) that line your sinuses and help remove mucus do not work properly or are not present. SIGNS AND SYMPTOMS Symptoms of acute and chronic sinusitis are the same. The primary symptoms are pain and pressure around the affected sinuses. Other symptoms include:  Upper toothache.  Earache.  Headache.  Bad breath.  Decreased sense of smell and taste.  A cough, which worsens when you are lying flat.  Fatigue.  Fever.  Thick drainage from your nose, which often is green and may contain pus (purulent).  Swelling and warmth over the affected sinuses. DIAGNOSIS Your health care provider will perform a physical exam. During your exam, your health care provider may perform any of the following to help determine if you have acute sinusitis or chronic sinusitis:  Look in your nose for signs of abnormal growths in your nostrils (nasal polyps).  Tap over the affected sinus to check for signs of  infection.  View the inside of your sinuses using an imaging device that has a light attached (endoscope). If your health care provider suspects that you have chronic sinusitis, one or more of the following tests may be recommended:  Allergy tests.  Nasal culture. A sample of mucus is taken from your nose, sent to a lab, and screened for bacteria.  Nasal cytology. A sample of mucus is taken from your nose and examined by your health care provider to determine if your sinusitis is related to an allergy. TREATMENT Most cases of acute sinusitis are related to a viral infection and will resolve on their own within 10 days. Sometimes, medicines are prescribed to help relieve symptoms of both acute and chronic sinusitis. These may include pain medicines, decongestants, nasal steroid sprays, or saline sprays. However, for sinusitis related to a bacterial infection, your health care provider will prescribe antibiotic medicines. These are medicines that will help kill the bacteria causing the infection. Rarely, sinusitis is caused by a fungal infection. In these cases, your health care provider will prescribe antifungal medicine. For some cases of chronic sinusitis, surgery is needed. Generally, these are cases in which sinusitis recurs more than 3 times  per year, despite other treatments. HOME CARE INSTRUCTIONS  Drink plenty of water. Water helps thin the mucus so your sinuses can drain more easily.  Use a humidifier.  Inhale steam 3-4 times a day (for example, sit in the bathroom with the shower running).  Apply a warm, moist washcloth to your face 3-4 times a day, or as directed by your health care provider.  Use saline nasal sprays to help moisten and clean your sinuses.  Take medicines only as directed by your health care provider.  If you were prescribed either an antibiotic or antifungal medicine, finish it all even if you start to feel better. SEEK IMMEDIATE MEDICAL CARE IF:  You have  increasing pain or severe headaches.  You have nausea, vomiting, or drowsiness.  You have swelling around your face.  You have vision problems.  You have a stiff neck.  You have difficulty breathing.   This information is not intended to replace advice given to you by your health care provider. Make sure you discuss any questions you have with your health care provider.   Document Released: 06/08/2005 Document Revised: 06/29/2014 Document Reviewed: 06/23/2011 Elsevier Interactive Patient Education 2016 Elsevier Inc[employer] Otitis Media With Effusion Otitis media with effusion is the presence of fluid in the middle ear. This is a common problem in children, which often follows ear infections. It may be present for weeks or longer after the infection. Unlike an acute ear infection, otitis media with effusion refers only to fluid behind the ear drum and not infection. Children with repeated ear and sinus infections and allergy problems are the most likely to get otitis media with effusion. CAUSES  The most frequent cause of the fluid buildup is dysfunction of the eustachian tubes. These are the tubes that drain fluid in the ears to the back of the nose (nasopharynx). SYMPTOMS   The main symptom of this condition is hearing loss. As a result, you or your child may:  Listen to the TV at a loud volume.  Not respond to questions.  Ask "what" often when spoken to.  Mistake or confuse one sound or word for another.  There may be a sensation of fullness or pressure but usually not pain. DIAGNOSIS   Your health care provider will diagnose this condition by examining you or your child's ears.  Your health care provider may test the pressure in you or your child's ear with a tympanometer.  A hearing test may be conducted if the problem persists. TREATMENT   Treatment depends on the duration and the effects of the effusion.  Antibiotics, decongestants, nose drops, and cortisone-type drugs  (tablets or nasal spray) may not be helpful.  Children with persistent ear effusions may have delayed language or behavioral problems. Children at risk for developmental delays in hearing, learning, and speech may require referral to a specialist earlier than children not at risk.  You or your child's health care provider may suggest a referral to an ear, nose, and throat surgeon for treatment. The following may help restore normal hearing:  Drainage of fluid.  Placement of ear tubes (tympanostomy tubes).  Removal of adenoids (adenoidectomy). HOME CARE INSTRUCTIONS   Avoid secondhand smoke.  Infants who are breastfed are less likely to have this condition.  Avoid feeding infants while they are lying flat.  Avoid known environmental allergens.  Avoid people who are sick. SEEK MEDICAL CARE IF:   Hearing is not better in 3 months.  Hearing is worse.  Ear pain.  Drainage from the ear.  Dizziness. MAKE SURE YOU:   Understand these instructions.  Will watch your condition.  Will get help right away if you are not doing well or get worse.   This information is not intended to replace advice given to you by your health care provider. Make sure you discuss any questions you have with your health care provider.   Document Released: 07/16/2004 Document Revised: 06/29/2014 Document Reviewed: 01/03/2013 Elsevier Interactive Patient Education [employer] Inc[employer]

## 2015-09-11 NOTE — ED Notes (Signed)
Patient started having breathing problems due to allergies 2 days ago and symptoms became worse yesterday after playing with kids outside.

## 2015-09-18 ENCOUNTER — Encounter: Payer: Self-pay | Admitting: Unknown Physician Specialty

## 2015-09-18 ENCOUNTER — Ambulatory Visit (INDEPENDENT_AMBULATORY_CARE_PROVIDER_SITE_OTHER): Payer: Managed Care, Other (non HMO) | Admitting: Unknown Physician Specialty

## 2015-09-18 VITALS — BP 108/73 | HR 87 | Temp 98.3°F | Ht 64.0 in | Wt 164.2 lb

## 2015-09-18 DIAGNOSIS — J069 Acute upper respiratory infection, unspecified: Secondary | ICD-10-CM | POA: Diagnosis not present

## 2015-09-18 DIAGNOSIS — J4521 Mild intermittent asthma with (acute) exacerbation: Secondary | ICD-10-CM

## 2015-09-18 MED ORDER — AZITHROMYCIN 250 MG PO TABS: ORAL_TABLET | ORAL | Status: DC

## 2015-09-18 NOTE — Progress Notes (Signed)
BP 108/73 mmHg  Pulse 87  Temp(Src) 98.3 F (36.8 C)  Ht 5\' 4"  (1.626 m)  Wt 164 lb 3.2 oz (74.481 kg)  BMI 28.17 kg/m2  SpO2 97%  LMP 09/11/2015   Subjective:    Patient ID: Tammy Stout, female    DOB: 11-07-1975, 40 y.o.   MRN: [id number]  HPI: Tammy Stout is a 40 y.o. female  Chief Complaint  Patient presents with  . URI    pt states she was seen at urgent care about a week ago for URI but states she feels like she is not getting better   Last week she went to the ER for "allergy attacks" and got a bag full of medication including Ventolin, flonase, singulair, Prednisone and Augmentin.  Over the weekend she got a fever which seemed to "burn through."  No flu test was done.  Still feels bad and has wheezing. Watery phlegm, and cough.    Note from urgent care reviewed.  She received, Augmentin, Singulair, Albuterol, Flonase, and Prednisone.    Relevant past medical, surgical, family and social history reviewed and updated as indicated. Interim medical history since our last visit reviewed. Allergies and medications reviewed and updated.  Review of Systems  Per HPI unless specifically indicated above     Objective:    BP 108/73 mmHg  Pulse 87  Temp(Src) 98.3 F (36.8 C)  Ht 5\' 4"  (1.626 m)  Wt 164 lb 3.2 oz (74.481 kg)  BMI 28.17 kg/m2  SpO2 97%  LMP 09/11/2015  Wt Readings from Last 3 Encounters:  09/18/15 164 lb 3.2 oz (74.481 kg)  09/11/15 170 lb (77.111 kg)  06/21/15 169 lb (76.658 kg)    Physical Exam  Constitutional: She is oriented to person, place, and time. She appears well-developed and well-nourished. No distress.  HENT:  Head: Normocephalic and atraumatic.  Right Ear: Tympanic membrane and ear canal normal.  Left Ear: Tympanic membrane and ear canal normal.  Nose: Rhinorrhea present. Right sinus exhibits no maxillary sinus tenderness and no frontal sinus tenderness. Left sinus exhibits no maxillary sinus tenderness and no frontal sinus  tenderness.  Mouth/Throat: Mucous membranes are normal. Posterior oropharyngeal erythema present.  Eyes: Conjunctivae and lids are normal. Right eye exhibits no discharge. Left eye exhibits no discharge. No scleral icterus.  Cardiovascular: Normal rate and regular rhythm.   Pulmonary/Chest: Effort normal and breath sounds normal. No respiratory distress.  Abdominal: Normal appearance. There is no splenomegaly or hepatomegaly.  Musculoskeletal: Normal range of motion.  Neurological: She is alert and oriented to person, place, and time.  Skin: Skin is intact. No rash noted. No pallor.  Psychiatric: She has a normal mood and affect. Her behavior is normal. Judgment and thought content normal.    Results for orders placed or performed in visit on 06/20/15  HM PAP SMEAR  Result Value Ref Range   HM Pap smear per PP       Assessment & Plan:   Problem List Items Addressed This Visit    None    Visit Diagnoses    Upper respiratory infection    -  Primary    Relevant Medications    azithromycin (ZITHROMAX Z-PAK) 250 MG tablet    Reactive airway disease, mild intermittent, with acute exacerbation           Continue present treatment through urgent care.  Sample of Symbicort 160 given to take 2 puffs BID.  Rx for Zithromax to r/o atypical iof  needed    Follow up plan: Return if symptoms worsen or fail to improve.

## 2015-12-09 ENCOUNTER — Telehealth: Payer: Self-pay | Admitting: Unknown Physician Specialty

## 2015-12-09 MED ORDER — DULOXETINE HCL 60 MG PO CPEP: 60.0000 mg | ORAL_CAPSULE | Freq: Every day | ORAL | Status: DC

## 2015-12-09 NOTE — Telephone Encounter (Signed)
Yes, please schedule a f/u

## 2015-12-09 NOTE — Telephone Encounter (Signed)
Called and scheduled patient a f/up for 12/17/15.

## 2015-12-09 NOTE — Telephone Encounter (Signed)
DULoxetine (CYMBALTA) 60 MG capsule Pharmacy: [facility] - [place], [place] - [address]  Patient called stating that she would like to continue care here with us and would like to start seeing Elnita Maxwellheryl. Patient needs refill on her medication sent to her pharmacy, thanks.

## 2015-12-09 NOTE — Telephone Encounter (Signed)
Routing to provider. Patient does not have f/u scheduled with Elnita Maxwellheryl. Does she need one?

## 2015-12-17 ENCOUNTER — Encounter: Payer: Self-pay | Admitting: Unknown Physician Specialty

## 2015-12-17 ENCOUNTER — Ambulatory Visit (INDEPENDENT_AMBULATORY_CARE_PROVIDER_SITE_OTHER): Payer: BLUE CROSS/BLUE SHIELD | Admitting: Unknown Physician Specialty

## 2015-12-17 VITALS — BP 106/69 | HR 93 | Temp 98.3°F | Ht 65.1 in | Wt 159.2 lb

## 2015-12-17 DIAGNOSIS — F329 Major depressive disorder, single episode, unspecified: Secondary | ICD-10-CM

## 2015-12-17 DIAGNOSIS — F32A Depression, unspecified: Secondary | ICD-10-CM

## 2015-12-17 MED ORDER — DULOXETINE HCL 60 MG PO CPEP: 60.0000 mg | ORAL_CAPSULE | Freq: Every day | ORAL | Status: DC

## 2015-12-17 NOTE — Progress Notes (Signed)
   BP 106/69 mmHg  Pulse 93  Temp(Src) 98.3 F (36.8 C)  Ht 5' 5.1" (1.654 m)  Wt 159 lb 3.2 oz (72.213 kg)  BMI 26.40 kg/m2  SpO2 98%  LMP 12/03/2015 (Approximate)   Subjective:    Patient ID: Tammy Stout, female    DOB: 1975-09-12, 40 y.o.   MRN: [id number]  HPI: Tammy Stout is a 40 y.o. female  Chief Complaint  Patient presents with  . Follow-up    pt is transferring from Dr. Sherie DonLada   Depression Pt states she has tried many different medications beside Cymbalta.  This was a while ago and doesn't remember other than Lexapro does nothing. States she struggles more with a history of depression than anxiety.    She has been on it for about 12 years without needing to change the dose.    Depression screen PHQ 2/9 12/17/2015  Decreased Interest 0  Down, Depressed, Hopeless 0  PHQ - 2 Score 0      Relevant past medical, surgical, family and social history reviewed and updated as indicated. Interim medical history since our last visit reviewed. Allergies and medications reviewed and updated.  Review of Systems  Per HPI unless specifically indicated above     Objective:    BP 106/69 mmHg  Pulse 93  Temp(Src) 98.3 F (36.8 C)  Ht 5' 5.1" (1.654 m)  Wt 159 lb 3.2 oz (72.213 kg)  BMI 26.40 kg/m2  SpO2 98%  LMP 12/03/2015 (Approximate)  Wt Readings from Last 3 Encounters:  12/17/15 159 lb 3.2 oz (72.213 kg)  09/18/15 164 lb 3.2 oz (74.481 kg)  09/11/15 170 lb (77.111 kg)    Physical Exam  Constitutional: She is oriented to person, place, and time. She appears well-developed and well-nourished. No distress.  HENT:  Head: Normocephalic and atraumatic.  Eyes: Conjunctivae and lids are normal. Right eye exhibits no discharge. Left eye exhibits no discharge. No scleral icterus.  Neck: Normal range of motion. Neck supple. No JVD present. Carotid bruit is not present.  Cardiovascular: Normal rate, regular rhythm and normal heart sounds.   Pulmonary/Chest: Effort  normal and breath sounds normal.  Abdominal: Normal appearance. There is no splenomegaly or hepatomegaly.  Musculoskeletal: Normal range of motion.  Neurological: She is alert and oriented to person, place, and time.  Skin: Skin is warm, dry and intact. No rash noted. No pallor.  Psychiatric: She has a normal mood and affect. Her behavior is normal. Judgment and thought content normal.    Results for orders placed or performed in visit on 06/20/15  HM PAP SMEAR  Result Value Ref Range   HM Pap smear per PP       Assessment & Plan:   Problem List Items Addressed This Visit      Unprioritized   Depression - Primary    Good control with Cymbalta daily.  Continue present dose.        Relevant Medications   DULoxetine (CYMBALTA) 60 MG capsule      Health maintenance reviewed and pap through ObGyn.    Follow up plan: Return in about 6 months (around 06/17/2016) for physical.

## 2015-12-17 NOTE — Assessment & Plan Note (Signed)
Good control with Cymbalta daily.  Continue present dose.

## 2016-01-14 ENCOUNTER — Encounter: Payer: Self-pay | Admitting: Family Medicine

## 2016-02-21 ENCOUNTER — Encounter: Payer: Self-pay | Admitting: Family Medicine

## 2016-02-21 ENCOUNTER — Ambulatory Visit (INDEPENDENT_AMBULATORY_CARE_PROVIDER_SITE_OTHER): Payer: BLUE CROSS/BLUE SHIELD | Admitting: Family Medicine

## 2016-02-21 VITALS — BP 127/77 | HR 91 | Temp 98.7°F | Wt 163.0 lb

## 2016-02-21 DIAGNOSIS — R519 Headache, unspecified: Secondary | ICD-10-CM

## 2016-02-21 DIAGNOSIS — R51 Headache: Secondary | ICD-10-CM | POA: Diagnosis not present

## 2016-02-21 DIAGNOSIS — Z23 Encounter for immunization: Secondary | ICD-10-CM

## 2016-02-21 MED ORDER — SUMATRIPTAN SUCCINATE 50 MG PO TABS: ORAL_TABLET | ORAL | 0 refills | Status: DC

## 2016-02-21 MED ORDER — KETOROLAC TROMETHAMINE 60 MG/2ML IM SOLN
60.0000 mg | Freq: Once | INTRAMUSCULAR | Status: AC
  Administered 2016-02-21: 60 mg via INTRAMUSCULAR

## 2016-02-21 NOTE — Patient Instructions (Signed)
Follow up as needed

## 2016-02-21 NOTE — Progress Notes (Signed)
BP 127/77   Pulse 91   Temp 98.7 F (37.1 C)   Wt 163 lb (73.9 kg)   LMP 02/10/2016 (Exact Date)   SpO2 99%   BMI 27.04 kg/m    Subjective:    Patient ID: Tammy Stout, female    DOB: 23-Jan-1976, 40 y.o.   MRN: [id number]  HPI: Tammy Stout is a 40 y.o. female  Chief Complaint  Patient presents with  . Headache    This one has been going on for the last week. Some light sensitivity, some nausea but no vomiting. She has been taking Tylenol and Advil for them with a little relief.   Patient presents with a persistent migraine for about a week or so now. Frequently gets headaches during period that last 1-2 days, this time it started with cycle last week but has not dissipated and is getting worse and worse. Some nausea and photophobia, but no vomiting. Had some advil this morning and has been alternating tylenol and advil with no relief. Has never taken anything prescription strength for HAs. Has taken cymbalta for depression for a long time now.   Relevant past medical, surgical, family and social history reviewed and updated as indicated. Interim medical history since our last visit reviewed. Allergies and medications reviewed and updated.  Review of Systems  Constitutional: Negative for chills, diaphoresis and fever.  Eyes: Positive for photophobia.  Respiratory: Negative.   Cardiovascular: Negative.   Gastrointestinal: Positive for nausea.  Genitourinary: Negative.   Musculoskeletal: Negative.   Skin: Negative.   Neurological: Positive for headaches.  Psychiatric/Behavioral: Negative.     Per HPI unless specifically indicated above     Objective:    BP 127/77   Pulse 91   Temp 98.7 F (37.1 C)   Wt 163 lb (73.9 kg)   LMP 02/10/2016 (Exact Date)   SpO2 99%   BMI 27.04 kg/m   Wt Readings from Last 3 Encounters:  02/21/16 163 lb (73.9 kg)  12/17/15 159 lb 3.2 oz (72.2 kg)  09/18/15 164 lb 3.2 oz (74.5 kg)    Physical Exam  Constitutional: She is  oriented to person, place, and time. She appears well-developed and well-nourished. No distress.  HENT:  Head: Atraumatic.  Mouth/Throat: No oropharyngeal exudate.  Eyes: Conjunctivae are normal. No scleral icterus.  Neck: Normal range of motion. Neck supple.  Cardiovascular: Normal rate.   Pulmonary/Chest: Effort normal. No respiratory distress.  Lymphadenopathy:    She has no cervical adenopathy.  Neurological: She is alert and oriented to person, place, and time. No cranial nerve deficit. She exhibits normal muscle tone. Coordination normal.  Skin: Skin is warm and dry. No erythema.  Psychiatric: She has a normal mood and affect. Her behavior is normal.  Nursing note and vitals reviewed.     Assessment & Plan:   Problem List Items Addressed This Visit    None    Visit Diagnoses    Headache, unspecified headache type    -  Primary   IM Toradol given, imitrex sent for a trial. Take daily starting about 1 week prior to cycle. If active headache, can repeat dose in 2 hours if no relief.    Relevant Medications   SUMAtriptan (IMITREX) 50 MG tablet   ketorolac (TORADOL) injection 60 mg (Completed)   Encounter for immunization       Relevant Medications   SUMAtriptan (IMITREX) 50 MG tablet   Other Relevant Orders   Flu Vaccine QUAD 36+ mos  IM (Completed)   Needs flu shot        She will follow up in a few weeks about how the imitrex does for her.   Follow up plan: Return if symptoms worsen or fail to improve.

## 2016-05-29 ENCOUNTER — Encounter: Payer: Self-pay | Admitting: Unknown Physician Specialty

## 2016-05-29 ENCOUNTER — Ambulatory Visit (INDEPENDENT_AMBULATORY_CARE_PROVIDER_SITE_OTHER): Payer: 59 | Admitting: Unknown Physician Specialty

## 2016-05-29 VITALS — BP 119/79 | HR 116 | Temp 98.5°F | Ht 65.0 in | Wt 169.2 lb

## 2016-05-29 DIAGNOSIS — G43829 Menstrual migraine, not intractable, without status migrainosus: Secondary | ICD-10-CM

## 2016-05-29 DIAGNOSIS — F325 Major depressive disorder, single episode, in full remission: Secondary | ICD-10-CM | POA: Diagnosis not present

## 2016-05-29 DIAGNOSIS — Z0000 Encounter for general adult medical examination without abnormal findings: Secondary | ICD-10-CM | POA: Diagnosis not present

## 2016-05-29 DIAGNOSIS — G43909 Migraine, unspecified, not intractable, without status migrainosus: Secondary | ICD-10-CM | POA: Insufficient documentation

## 2016-05-29 MED ORDER — SUMATRIPTAN SUCCINATE 50 MG PO TABS: 50.0000 mg | ORAL_TABLET | ORAL | 2 refills | Status: DC | PRN

## 2016-05-29 NOTE — Assessment & Plan Note (Signed)
Stable, continue present medications.   

## 2016-05-29 NOTE — Progress Notes (Signed)
BP 119/79 (BP Location: Left Arm, Patient Position: Sitting, Cuff Size: Large)   Pulse (!) 116   Temp 98.5 F (36.9 C)   Ht 5\' 5"  (1.651 m)   Wt 169 lb 3.2 oz (76.7 kg)   LMP  (LMP Unknown)   SpO2 98%   BMI 28.16 kg/m    Subjective:    Patient ID: Tammy FlurryJoan E Iman, female    DOB: [birth year], 40 y.o.   MRN: [id number]  HPI: Tammy Stout is a 40 y.o. female  Chief Complaint  Patient presents with  . Annual Exam   Migraine headaches States Imitrex worked well.  She is out and would like a refill.    Last pap 3 years ago.  No HPV was done.    Depression Doing well with Cymbalta  Relevant past medical, surgical, family and social history reviewed and updated as indicated. Interim medical history since our last visit reviewed. Allergies and medications reviewed and updated.  Review of Systems  Constitutional: Negative.   HENT: Negative.   Eyes: Negative.   Respiratory: Negative.   Cardiovascular: Negative.   Gastrointestinal: Negative.   Endocrine: Negative.   Genitourinary: Negative.   Musculoskeletal: Negative.   Skin: Negative.   Allergic/Immunologic: Negative.   Neurological: Negative.   Hematological: Negative.   Psychiatric/Behavioral: Negative.     Per HPI unless specifically indicated above     Objective:    BP 119/79 (BP Location: Left Arm, Patient Position: Sitting, Cuff Size: Large)   Pulse (!) 116   Temp 98.5 F (36.9 C)   Ht 5\' 5"  (1.651 m)   Wt 169 lb 3.2 oz (76.7 kg)   LMP  (LMP Unknown)   SpO2 98%   BMI 28.16 kg/m   Wt Readings from Last 3 Encounters:  05/29/16 169 lb 3.2 oz (76.7 kg)  02/21/16 163 lb (73.9 kg)  12/17/15 159 lb 3.2 oz (72.2 kg)    Physical Exam  Constitutional: She is oriented to person, place, and time. She appears well-developed and well-nourished.  HENT:  Head: Normocephalic and atraumatic.  Eyes: Pupils are equal, round, and reactive to light. Right eye exhibits no discharge. Left eye exhibits no discharge.  No scleral icterus.  Neck: Normal range of motion. Neck supple. Carotid bruit is not present. No thyromegaly present.  Cardiovascular: Normal rate, regular rhythm and normal heart sounds.  Exam reveals no gallop and no friction rub.   No murmur heard. Pulmonary/Chest: Effort normal and breath sounds normal. No respiratory distress. She has no wheezes. She has no rales.  Abdominal: Soft. Bowel sounds are normal. There is no tenderness. There is no rebound.  Genitourinary: Vagina normal and uterus normal. No breast swelling, tenderness or discharge. Cervix exhibits no motion tenderness, no discharge and no friability. Right adnexum displays no mass, no tenderness and no fullness. Left adnexum displays no mass, no tenderness and no fullness.  Musculoskeletal: Normal range of motion.  Lymphadenopathy:    She has no cervical adenopathy.  Neurological: She is alert and oriented to person, place, and time.  Skin: Skin is warm, dry and intact. No rash noted.  Psychiatric: She has a normal mood and affect. Her speech is normal and behavior is normal. Judgment and thought content normal. Cognition and memory are normal.    Results for orders placed or performed in visit on 06/20/15  HM PAP SMEAR  Result Value Ref Range   HM Pap smear per PP       Assessment & Plan:  Problem List Items Addressed This Visit      Unprioritized   Depression    Stable, continue present medications.        Migraine   Relevant Medications   SUMAtriptan (IMITREX) 50 MG tablet    Other Visit Diagnoses    Annual physical exam    -  Primary   Relevant Orders   IGP, Aptima HPV, rfx 16/18,45   Comprehensive metabolic panel   CBC with Differential/Platelet   TSH   Lipid Panel w/o Chol/HDL Ratio       Follow up plan: Return if symptoms worsen or fail to improve.

## 2016-05-29 NOTE — Patient Instructions (Signed)
Try magnesium supplements for your migraines.  I prefer Magnesium Citrate as opposed to the more common Oxide preperation

## 2016-05-30 LAB — LIPID PANEL W/O CHOL/HDL RATIO
Cholesterol, Total: 223 mg/dL — ABNORMAL HIGH (ref 100–199)
HDL: 72 mg/dL (ref >39)
LDL Calculated: 116 mg/dL — ABNORMAL HIGH (ref 0–99)
Triglycerides: 173 mg/dL — ABNORMAL HIGH (ref 0–149)
VLDL Cholesterol Cal: 35 mg/dL (ref 5–40)

## 2016-05-30 LAB — CBC WITH DIFFERENTIAL/PLATELET
BASOS ABS: 0 10*3/uL (ref 0.0–0.2)
BASOS: 1 %
EOS (ABSOLUTE): 0.3 10*3/uL (ref 0.0–0.4)
Eos: 4 %
Hematocrit: 36.5 % (ref 34.0–46.6)
Hemoglobin: 12.1 g/dL (ref 11.1–15.9)
IMMATURE GRANS (ABS): 0 10*3/uL (ref 0.0–0.1)
Immature Granulocytes: 0 %
LYMPHS ABS: 1.8 10*3/uL (ref 0.7–3.1)
LYMPHS: 22 %
MCH: 30 pg (ref 26.6–33.0)
MCHC: 33.2 g/dL (ref 31.5–35.7)
MCV: 90 fL (ref 79–97)
MONOS ABS: 0.7 10*3/uL (ref 0.1–0.9)
Monocytes: 9 %
NEUTROS ABS: 5.2 10*3/uL (ref 1.4–7.0)
Neutrophils: 64 %
PLATELETS: 287 10*3/uL (ref 150–379)
RBC: 4.04 x10E6/uL (ref 3.77–5.28)
RDW: 12.9 % (ref 12.3–15.4)
WBC: 8 10*3/uL (ref 3.4–10.8)

## 2016-05-30 LAB — COMPREHENSIVE METABOLIC PANEL
A/G RATIO: 1.7 (ref 1.2–2.2)
ALT: 15 IU/L (ref 0–32)
AST: 15 IU/L (ref 0–40)
Albumin: 4.5 g/dL (ref 3.5–5.5)
Alkaline Phosphatase: 71 IU/L (ref 39–117)
BUN/Creatinine Ratio: 20 (ref 9–23)
BUN: 13 mg/dL (ref 6–20)
CHLORIDE: 98 mmol/L (ref 96–106)
CO2: 25 mmol/L (ref 18–29)
Calcium: 9.1 mg/dL (ref 8.7–10.2)
Creatinine, Ser: 0.64 mg/dL (ref 0.57–1.00)
GFR, EST AFRICAN AMERICAN: 130 mL/min/{1.73_m2} (ref >59)
GFR, EST NON AFRICAN AMERICAN: 113 mL/min/{1.73_m2} (ref >59)
GLOBULIN, TOTAL: 2.7 g/dL (ref 1.5–4.5)
Glucose: 77 mg/dL (ref 65–99)
POTASSIUM: 3.8 mmol/L (ref 3.5–5.2)
SODIUM: 139 mmol/L (ref 134–144)
TOTAL PROTEIN: 7.2 g/dL (ref 6.0–8.5)

## 2016-05-30 LAB — TSH: TSH: 2.44 u[IU]/mL (ref 0.450–4.500)

## 2016-06-01 ENCOUNTER — Encounter: Payer: Self-pay | Admitting: Unknown Physician Specialty

## 2016-06-01 ENCOUNTER — Telehealth: Payer: Commercial Indemnity | Admitting: Family

## 2016-06-01 DIAGNOSIS — L739 Follicular disorder, unspecified: Secondary | ICD-10-CM

## 2016-06-01 MED ORDER — DOXYCYCLINE HYCLATE 100 MG PO TABS: 100.0000 mg | ORAL_TABLET | Freq: Two times a day (BID) | ORAL | 0 refills | Status: DC

## 2016-06-01 NOTE — Progress Notes (Signed)
E Visit for Rash  We are sorry that you are not feeling well. Here is how we plan to help!  Based upon what you have shared with me it looks like you have a bacterial follicultits.  Folliculitis is inflammation of the hair follicles that can be caused by a superficial infection of the skin and is treated with an antibiotic. I have prescribed: Doxycycline twice daily x 10 days.    HOME CARE:   Take cool showers and avoid direct sunlight.  Apply cool compress or wet dressings.  Take a bath in an oatmeal bath.  Sprinkle content of one Aveeno packet under running faucet with comfortably warm water.  Bathe for 15-20 minutes, 1-2 times daily.  Pat dry with a towel. Do not rub the rash.  Use hydrocortisone cream.  Take an antihistamine like Benadryl for widespread rashes that itch.  The adult dose of Benadryl is 25-50 mg by mouth 4 times daily.  Caution:  This type of medication may cause sleepiness.  Do not drink alcohol, drive, or operate dangerous machinery while taking antihistamines.  Do not take these medications if you have prostate enlargement.  Read package instructions thoroughly on all medications that you take.  GET HELP RIGHT AWAY IF:   Symptoms don't go away after treatment.  Severe itching that persists.  If you rash spreads or swells.  If you rash begins to smell.  If it blisters and opens or develops a yellow-brown crust.  You develop a fever.  You have a sore throat.  You become short of breath.  MAKE SURE YOU:  Understand these instructions. Will watch your condition. Will get help right away if you are not doing well or get worse.  Thank you for choosing an e-visit. Your e-visit answers were reviewed by a board certified advanced clinical practitioner to complete your personal care plan. Depending upon the condition, your plan could have included both over the counter or prescription medications. Please review your pharmacy choice. Be sure that the  pharmacy you have chosen is open so that you can pick up your prescription now.  If there is a problem you may message your provider in MyChart to have the prescription routed to another pharmacy. Your safety is important to us. If you have drug allergies check your prescription carefully.  For the next 24 hours, you can use MyChart to ask questions about today's visit, request a non-urgent call back, or ask for a work or school excuse from your e-visit provider. You will get an email in the next two days asking about your experience. I hope that your e-visit has been valuable and will speed your recovery.

## 2016-06-03 LAB — IGP, APTIMA HPV, RFX 16/18,45
HPV Aptima: NEGATIVE
PAP SMEAR COMMENT: 0

## 2016-06-05 ENCOUNTER — Telehealth: Payer: Self-pay | Admitting: Family Medicine

## 2016-06-05 DIAGNOSIS — R928 Other abnormal and inconclusive findings on diagnostic imaging of breast: Secondary | ICD-10-CM | POA: Insufficient documentation

## 2016-06-05 NOTE — Telephone Encounter (Signed)
Patient due for 6 month breast imaging; ordered

## 2016-06-05 NOTE — Telephone Encounter (Signed)
-----   Message from Kerman PasseyMelinda P Kimball Appleby, MD sent at 04/24/2016  7:16 PM EDT ----- Regarding: RE: 6 month breast imaging Dec 2017 [facility] [place], bilateral diag mammo  ----- Message ----- From: Kerman PasseyMelinda P Constancia Geeting, MD Sent: 12/19/2015   7:17 PM To: Kerman PasseyMelinda P Jaiveon Suppes, MD Subject: 6 month breast imaging Nov 2017

## 2016-06-05 NOTE — Assessment & Plan Note (Signed)
Due for 6 month f/u diag mammo of both breasts, [facility]

## 2016-07-29 ENCOUNTER — Ambulatory Visit (INDEPENDENT_AMBULATORY_CARE_PROVIDER_SITE_OTHER): Payer: Managed Care, Other (non HMO) | Admitting: Unknown Physician Specialty

## 2016-07-29 ENCOUNTER — Encounter: Payer: Self-pay | Admitting: Unknown Physician Specialty

## 2016-07-29 VITALS — BP 104/73 | HR 89 | Temp 98.2°F | Wt 172.2 lb

## 2016-07-29 DIAGNOSIS — J029 Acute pharyngitis, unspecified: Secondary | ICD-10-CM

## 2016-07-29 NOTE — Progress Notes (Signed)
BP 104/73 (BP Location: Left Arm, Patient Position: Sitting, Cuff Size: Large)   Pulse 89   Temp 98.2 F (36.8 C)   Wt 172 lb 3.2 oz (78.1 kg)   SpO2 96%   BMI 28.66 kg/m    Subjective:    Patient ID: Tammy Stout, female    DOB: 07/13/1975, 40 y.o.   MRN: [id number]  HPI: Tammy Stout is a 41 y.o. female  Chief Complaint  Patient presents with  . Sore Throat    pt states she has had a sore throat for about a week but states it got worse yesterday, states other children in her daughter's class are out with strep    Patient presents to clinic today with a sore throat that started last night. States that she started having some excessive mucous accumulation about a week ago and lost her voice for a day or two, but that her throat only became sore last night. Notes that her daughter came home from school yesterday and reported a couple of children out with strep throat. 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 Admits to a cough, congestion, rhinorrhea, sneezing, post nasal drip and dysphagia. Denies any fever, chills, fatigue, headache, myalgias, ear pain, sinus pressure, chest tightness, nausea, vomiting or diarrhea. She has been using OTC tylenol cold medicine for her cold symptoms and OTC chloraseptic spray for her throat, both with some relief.  Relevant past medical, surgical, family and social history reviewed and updated as indicated. Interim medical history since our last visit reviewed. Allergies and medications reviewed and updated.  Review of Systems  Constitutional: Negative for chills, fatigue and fever.  HENT: Positive for congestion, postnasal drip, rhinorrhea, sneezing, sore throat, trouble swallowing and voice change. Negative for ear pain, facial swelling, sinus pain and sinus pressure.   Eyes: Negative for pain and discharge.  Respiratory: Positive for cough. Negative for choking, chest tightness, shortness of breath and  wheezing.   Gastrointestinal: Negative for constipation, diarrhea, nausea and vomiting.  Musculoskeletal: Negative for arthralgias and myalgias.  Skin: Negative for rash.  Neurological: Negative for dizziness, light-headedness and headaches.    Per HPI unless specifically indicated above     Objective:    BP 104/73 (BP Location: Left Arm, Patient Position: Sitting, Cuff Size: Large)   Pulse 89   Temp 98.2 F (36.8 C)   Wt 172 lb 3.2 oz (78.1 kg)   SpO2 96%   BMI 28.66 kg/m   Wt Readings from Last 3 Encounters:  07/29/16 172 lb 3.2 oz (78.1 kg)  05/29/16 169 lb 3.2 oz (76.7 kg)  02/21/16 163 lb (73.9 kg)    Physical Exam  Constitutional: She is oriented to person, place, and time. She appears well-developed and well-nourished. No distress.  HENT:  Head: Normocephalic and atraumatic.  Right Ear: External ear normal.  Left Ear: External ear normal.  Nose: Nose normal.  Mouth/Throat: Posterior oropharyngeal erythema present. No oropharyngeal exudate.  Eyes: Conjunctivae are normal. Right eye exhibits no discharge. Left eye exhibits no discharge. No scleral icterus.  Neck: Neck supple.  Cardiovascular: Normal rate, regular rhythm and normal heart sounds.   Pulmonary/Chest: Effort normal and breath sounds normal. No respiratory distress.  Lymphadenopathy:       Head (right side): No submental, no submandibular, no tonsillar, no preauricular, no posterior auricular and no occipital adenopathy present.       Head (left side): No submental, no submandibular, no tonsillar,  no preauricular, no posterior auricular and no occipital adenopathy present.    She has no cervical adenopathy.  Neurological: She is alert and oriented to person, place, and time.  Skin: Skin is warm and dry. She is not diaphoretic.  Psychiatric: She has a normal mood and affect. Her behavior is normal. Judgment and thought content normal.  Nursing note and vitals reviewed.      Assessment & Plan:   Problem  List Items Addressed This Visit    None    Visit Diagnoses    Sore throat    -  Primary   Strep test negative. Encouraged to rest and drink plenty of fluids. 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   Relevant Orders   Rapid strep screen (not at [facility])       Follow up plan: Return if symptoms worsen or fail to improve.

## 2016-07-31 LAB — CULTURE, GROUP A STREP: STREP A CULTURE: NEGATIVE

## 2016-07-31 LAB — RAPID STREP SCREEN (MED CTR MEBANE ONLY): Strep Gp A Ag, IA W/Reflex: NEGATIVE

## 2016-08-22 ENCOUNTER — Ambulatory Visit
Admission: EM | Admit: 2016-08-22 | Discharge: 2016-08-22 | Disposition: A | Discharge status: Home / Self Care | Payer: 59 | Attending: Family Medicine | Admitting: Family Medicine

## 2016-08-22 ENCOUNTER — Encounter: Payer: Self-pay | Admitting: Gynecology

## 2016-08-22 DIAGNOSIS — J020 Streptococcal pharyngitis: Secondary | ICD-10-CM | POA: Diagnosis not present

## 2016-08-22 LAB — RAPID STREP SCREEN (MED CTR MEBANE ONLY): STREPTOCOCCUS, GROUP A SCREEN (DIRECT): POSITIVE — AB

## 2016-08-22 MED ORDER — PENICILLIN V POTASSIUM 500 MG PO TABS: 500.0000 mg | ORAL_TABLET | Freq: Two times a day (BID) | ORAL | 0 refills | Status: AC

## 2016-08-22 NOTE — ED Triage Notes (Signed)
Per patient sore throat and fever at home x yesterday of 103.

## 2016-08-22 NOTE — ED Provider Notes (Signed)
CSN: [id number]     Arrival date & time 08/22/16  0815 History   First MD Initiated Contact with Patient 08/22/16 [phone]     Chief Complaint  Patient presents with  . Sore Throat  . Fever   (Consider location/radiation/quality/duration/timing/severity/associated sxs/prior Treatment) 41 year old female presents with sore throat for the past 3 days that got much worse last night. Started running a fever of 103 yesterday and having chills today. Having minimal nasal congestion and denies any coughing or GI symptoms. Has taken Tylenol and cough drops with no relief. 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    The history is provided by the patient.    Past Medical History:  Diagnosis Date  . Depression   . Dermatitis 2012  . Eczema   . Perioral dermatitis    Past Surgical History:  Procedure Laterality Date  . CESAREAN SECTION  05/2006   SECTION DUE TO NO DIALATIONA AND CORD WAS AROUND BABY NECK   Family History  Problem Relation Age of Onset  . Cancer Paternal Grandmother 1580    kidney cancer  . Cancer Maternal Grandmother 80    lung cancer  . Diabetes Maternal Grandmother   . Diabetes Mother   . Cancer Mother 642    One breast  . Thyroid disease Father   . Hyperlipidemia Father   . Diabetes Maternal Grandfather   . Thyroid disease Maternal Aunt    Social History  Substance Use Topics  . Smoking status: Never Smoker  . Smokeless tobacco: Never Used  . Alcohol use Yes     Comment: social   OB History    Gravida Para Term Preterm AB Living   4 3 1 1   2    SAB TAB Ectopic Multiple Live Births           2     Review of Systems  Constitutional: Positive for chills, fatigue and fever.  HENT: Positive for postnasal drip, sore throat and trouble swallowing. Negative for congestion, ear pain, sinus pain, sinus pressure and sneezing.   Eyes: Negative for discharge.  Respiratory: Negative for cough, chest tightness, shortness of breath and wheezing.    Cardiovascular: Negative for chest pain.  Gastrointestinal: Negative for abdominal pain, diarrhea, nausea and vomiting.  Musculoskeletal: Negative for arthralgias, myalgias, neck pain and neck stiffness.  Skin: Negative for rash and wound.  Neurological: Negative for dizziness, syncope, weakness, light-headedness and headaches.  Hematological: Positive for adenopathy.    Allergies  Patient has no known allergies.  Home Medications   Prior to Admission medications   Medication Sig Start Date End Date Taking? Authorizing Provider  cetirizine (ZYRTEC) 10 MG tablet Take 10 mg by mouth daily.   Yes Historical Provider, MD  DULoxetine (CYMBALTA) 60 MG capsule Take 1 capsule (60 mg total) by mouth daily. 12/17/15  Yes Gabriel Cirriheryl Wicker, NP  IUD'S IU by Intrauterine route.   Yes Historical Provider, MD  SUMAtriptan (IMITREX) 50 MG tablet Take 1 tablet (50 mg total) by mouth every 2 (two) hours as needed for migraine. May repeat in 2 hours if headache persists or recurs. 05/29/16  Yes Gabriel Cirriheryl Wicker, NP  penicillin v potassium (VEETID) 500 MG tablet Take 1 tablet (500 mg total) by mouth 2 (two) times daily. 08/22/16 09/01/16  Sudie GrumblingAnn Berry Boots Mcglown, NP   Meds Ordered and Administered this Visit  Medications - No data to display  BP 116/66 (BP Location: Left Arm)   Pulse (!) 124  Temp 98.6 F (37 C) (Oral)   Resp 16   Ht 5\' 6"  (1.676 m)   Wt 170 lb (77.1 kg)   LMP 08/20/2016   SpO2 99%   BMI 27.44 kg/m  No data found.   Physical Exam  Constitutional: She is oriented to person, place, and time. She appears well-developed and well-nourished. She appears ill. No distress.  HENT:  Head: Normocephalic and atraumatic.  Right Ear: Hearing, tympanic membrane, external ear and ear canal normal.  Left Ear: Hearing, tympanic membrane, external ear and ear canal normal.  Nose: Nose normal. Right sinus exhibits no maxillary sinus tenderness and no frontal sinus tenderness. Left sinus exhibits no maxillary  sinus tenderness and no frontal sinus tenderness.  Mouth/Throat: Uvula is midline and mucous membranes are normal. Oropharyngeal exudate (slight white to yellow), posterior oropharyngeal edema and posterior oropharyngeal erythema present. Tonsils are 3+ on the right. Tonsils are 3+ on the left.  Neck: Normal range of motion. Neck supple.  Cardiovascular: Regular rhythm and normal heart sounds.  Tachycardia present.   Pulmonary/Chest: Effort normal and breath sounds normal. No respiratory distress. She has no decreased breath sounds. She has no wheezes. She has no rhonchi.  Lymphadenopathy:       Head (right side): Tonsillar adenopathy present.       Head (left side): Tonsillar adenopathy present.    She has cervical adenopathy.       Right cervical: Superficial cervical and deep cervical adenopathy present.       Left cervical: Superficial cervical and deep cervical adenopathy present.  Neurological: She is alert and oriented to person, place, and time.  Skin: Skin is warm and dry. Capillary refill takes less than 2 seconds.  Psychiatric: She has a normal mood and affect. Her behavior is normal. Judgment and thought content normal.    Urgent Care Course     Procedures (including critical care time)  Labs Review Labs Reviewed  RAPID STREP SCREEN (NOT AT [facility]) - Abnormal; Notable for the following:       Result Value   Streptococcus, Group A Screen (Direct) POSITIVE (*)    All other components within normal limits    Imaging Review No results found.   Visual Acuity Review  Right Eye Distance:   Left Eye Distance:   Bilateral Distance:    Right Eye Near:   Left Eye Near:    Bilateral Near:         MDM   1. Strep pharyngitis    Reviewed positive rapid strep test with patient. Recommend start Penicillin 500mg  twice a day as directed. May continue Tylenol as needed for fever and pain. Recommend follow-up with her primary care provider in 3 to 4 days if not improving.       Sudie Grumbling, NP 08/22/16 1010

## 2016-08-22 NOTE — Discharge Instructions (Signed)
Recommend start Penicillin 500mg  twice a day as directed. May continue Tylenol as needed for fever and pain. Follow-up with your primary care provider in 3 to 4 days if not improving.

## 2016-09-22 ENCOUNTER — Encounter: Payer: Self-pay | Admitting: Unknown Physician Specialty

## 2016-09-22 ENCOUNTER — Ambulatory Visit (INDEPENDENT_AMBULATORY_CARE_PROVIDER_SITE_OTHER): Payer: 59 | Admitting: Unknown Physician Specialty

## 2016-09-22 VITALS — BP 123/82 | HR 128 | Temp 100.1°F | Wt 176.8 lb

## 2016-09-22 DIAGNOSIS — R059 Cough, unspecified: Secondary | ICD-10-CM

## 2016-09-22 DIAGNOSIS — B9789 Other viral agents as the cause of diseases classified elsewhere: Secondary | ICD-10-CM | POA: Diagnosis not present

## 2016-09-22 DIAGNOSIS — R509 Fever, unspecified: Secondary | ICD-10-CM | POA: Diagnosis not present

## 2016-09-22 DIAGNOSIS — R05 Cough: Secondary | ICD-10-CM

## 2016-09-22 DIAGNOSIS — J069 Acute upper respiratory infection, unspecified: Secondary | ICD-10-CM

## 2016-09-22 LAB — VERITOR FLU A/B WAIVED
INFLUENZA A: NEGATIVE
Influenza B: NEGATIVE

## 2016-09-22 MED ORDER — HYDROCOD POLST-CPM POLST ER 10-8 MG/5ML PO SUER: 5.0000 mL | Freq: Two times a day (BID) | ORAL | 0 refills | Status: DC | PRN

## 2016-09-22 MED ORDER — AZITHROMYCIN 250 MG PO TABS: ORAL_TABLET | ORAL | 0 refills | Status: DC

## 2016-09-22 NOTE — Progress Notes (Signed)
BP 123/82 (BP Location: Left Arm, Patient Position: Sitting, Cuff Size: Normal)   Pulse (!) 128   Temp 100.1 F (37.8 C)   Wt 176 lb 12.8 oz (80.2 kg)   SpO2 98%   BMI 28.54 kg/m    Subjective:    Patient ID: Tammy Stout, female    DOB: 1975/08/23, 41 y.o.   MRN: [id number]  HPI: Tammy Stout is a 41 y.o. female  Chief Complaint  Patient presents with  . URI    pt states she has had a fever, cough and congestion since Saturday evening   URI   This is a new problem. Episode onset: 3 days. The problem has been unchanged. The maximum temperature recorded prior to her arrival was 101 - 101.9 F. The fever has been present for 1 to 2 days. Associated symptoms include congestion, coughing, headaches, rhinorrhea, sneezing and a sore throat. She has tried decongestant and acetaminophen for the symptoms. The treatment provided mild relief.    Relevant past medical, surgical, family and social history reviewed and updated as indicated. Interim medical history since our last visit reviewed. Allergies and medications reviewed and updated.  Review of Systems  HENT: Positive for congestion, rhinorrhea, sneezing and sore throat.   Respiratory: Positive for cough.   Neurological: Positive for headaches.    Per HPI unless specifically indicated above     Objective:    BP 123/82 (BP Location: Left Arm, Patient Position: Sitting, Cuff Size: Normal)   Pulse (!) 128   Temp 100.1 F (37.8 C)   Wt 176 lb 12.8 oz (80.2 kg)   SpO2 98%   BMI 28.54 kg/m   Wt Readings from Last 3 Encounters:  09/22/16 176 lb 12.8 oz (80.2 kg)  08/22/16 170 lb (77.1 kg)  07/29/16 172 lb 3.2 oz (78.1 kg)    Physical Exam  Constitutional: She is oriented to person, place, and time. She appears well-developed and well-nourished. No distress.  HENT:  Head: Normocephalic and atraumatic.  Right Ear: Tympanic membrane and ear canal normal.  Left Ear: Tympanic membrane and ear canal normal.  Nose:  Rhinorrhea present. Right sinus exhibits no maxillary sinus tenderness and no frontal sinus tenderness. Left sinus exhibits no maxillary sinus tenderness and no frontal sinus tenderness.  Mouth/Throat: Mucous membranes are normal. Posterior oropharyngeal erythema present.  Eyes: Conjunctivae and lids are normal. Right eye exhibits no discharge. Left eye exhibits no discharge. No scleral icterus.  Cardiovascular: Normal rate and regular rhythm.   Pulmonary/Chest: Effort normal and breath sounds normal. No respiratory distress.  Abdominal: Normal appearance. There is no splenomegaly or hepatomegaly.  Musculoskeletal: Normal range of motion.  Neurological: She is alert and oriented to person, place, and time.  Skin: Skin is intact. No rash noted. No pallor.  Psychiatric: She has a normal mood and affect. Her behavior is normal. Judgment and thought content normal.    Results for orders placed or performed during the hospital encounter of 08/22/16  Rapid strep screen  Result Value Ref Range   Streptococcus, Group A Screen (Direct) POSITIVE (A) NEGATIVE      Assessment & Plan:   Problem List Items Addressed This Visit    None    Visit Diagnoses    Cough    -  Primary   Relevant Orders   Veritor Flu A/B Waived   Fever, unspecified fever cause       Relevant Orders   Veritor Flu A/B Waived   Viral upper  respiratory tract infection       since has a fever and negative flu test.  Rx for Zithromax.  Tussionex for cough   Relevant Medications   azithromycin (ZITHROMAX Z-PAK) 250 MG tablet       Follow up plan: Return if symptoms worsen or fail to improve.

## 2016-09-25 ENCOUNTER — Other Ambulatory Visit: Payer: Self-pay

## 2016-09-25 MED ORDER — DULOXETINE HCL 60 MG PO CPEP: 60.0000 mg | ORAL_CAPSULE | Freq: Every day | ORAL | 1 refills | Status: DC

## 2016-11-25 ENCOUNTER — Ambulatory Visit (INDEPENDENT_AMBULATORY_CARE_PROVIDER_SITE_OTHER): Payer: 59 | Admitting: Unknown Physician Specialty

## 2016-11-25 ENCOUNTER — Encounter: Payer: Self-pay | Admitting: Unknown Physician Specialty

## 2016-11-25 VITALS — BP 117/75 | HR 91 | Temp 98.0°F | Ht 64.9 in | Wt 167.0 lb

## 2016-11-25 DIAGNOSIS — F325 Major depressive disorder, single episode, in full remission: Secondary | ICD-10-CM

## 2016-11-25 DIAGNOSIS — Z0000 Encounter for general adult medical examination without abnormal findings: Secondary | ICD-10-CM

## 2016-11-25 DIAGNOSIS — G43829 Menstrual migraine, not intractable, without status migrainosus: Secondary | ICD-10-CM

## 2016-11-25 NOTE — Assessment & Plan Note (Signed)
Stable, continue present medications.   

## 2016-11-25 NOTE — Progress Notes (Signed)
BP 117/75   Pulse 91   Temp 98 F (36.7 C)   Ht 5' 4.9" (1.648 m)   Wt 167 lb (75.8 kg)   LMP 11/18/2016 (Approximate)   SpO2 99%   BMI 27.88 kg/m    Subjective:    Patient ID: Tammy Stout, female    DOB: 23-Sep-1975, 41 y.o.   MRN: [id number]  HPI: Tammy Stout is a 41 y.o. female  Chief Complaint  Patient presents with  . 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    Migraines Stable.  Gets a little migraine around menses.  Mostly taking exedrine migraine.   Depression Stable on Cymbalta which she has been taking for over 10 years.   Depression screen [facility] 2/9 11/25/2016 05/29/2016 12/17/2015  Decreased Interest 0 0 0  Down, Depressed, Hopeless 0 0 0  PHQ - 2 Score 0 0 0  Altered sleeping - 0 -  Tired, decreased energy - 0 -  Change in appetite - 0 -  Feeling bad or failure about yourself  - 0 -  Trouble concentrating - 0 -  Moving slowly or fidgety/restless - 0 -  Suicidal thoughts - 0 -  PHQ-9 Score - 0 -   Relevant past medical, surgical, family and social history reviewed and updated as indicated. Interim medical history since our last visit reviewed. Allergies and medications reviewed and updated.  Review of Systems  Per HPI unless specifically indicated above     Objective:    BP 117/75   Pulse 91   Temp 98 F (36.7 C)   Ht 5' 4.9" (1.648 m)   Wt 167 lb (75.8 kg)   LMP 11/18/2016 (Approximate)   SpO2 99%   BMI 27.88 kg/m   Wt Readings from Last 3 Encounters:  11/25/16 167 lb (75.8 kg)  09/22/16 176 lb 12.8 oz (80.2 kg)  08/22/16 170 lb (77.1 kg)    Physical Exam  Constitutional: She is oriented to person, place, and time. She appears well-developed and well-nourished. No distress.  HENT:  Head: Normocephalic and atraumatic.  Eyes: Conjunctivae and lids are normal. Right eye exhibits no discharge. Left eye exhibits no discharge. No scleral icterus.  Neck:  Normal range of motion. Neck supple. No JVD present. Carotid bruit is not present.  Cardiovascular: Normal rate, regular rhythm and normal heart sounds.   Pulmonary/Chest: Effort normal and breath sounds normal.  Abdominal: Normal appearance. There is no splenomegaly or hepatomegaly.  Musculoskeletal: Normal range of motion.  Neurological: She is alert and oriented to person, place, and time.  Skin: Skin is warm, dry and intact. No rash noted. No pallor.  Psychiatric: She has a normal mood and affect. Her behavior is normal. Judgment and thought content normal.    Results for orders placed or performed in visit on 09/22/16  Veritor Flu A/B Waived  Result Value Ref Range   Influenza A Negative Negative   Influenza B Negative Negative      Assessment & Plan:   Problem List Items Addressed This Visit      Unprioritized   Depression    Stable, continue present medications.        Migraine    Stable, continue present medications.         Other Visit Diagnoses    Routine general medical examination at a health care  facility    -  Primary   Relevant Orders   Quantiferon tb gold assay (blood)       Follow up plan: No Follow-up on file.

## 2016-11-28 LAB — QUANTIFERON IN TUBE
QFT TB AG MINUS NIL VALUE: <0 IU/mL
QUANTIFERON NIL VALUE: 0.07 [IU]/mL
QUANTIFERON TB AG VALUE: 0.06 IU/mL
QUANTIFERON TB GOLD: NEGATIVE

## 2016-11-28 LAB — QUANTIFERON TB GOLD ASSAY (BLOOD)

## 2016-11-30 ENCOUNTER — Encounter: Payer: 59 | Admitting: Unknown Physician Specialty

## 2017-05-31 ENCOUNTER — Telehealth: Payer: 59 | Admitting: Family

## 2017-05-31 ENCOUNTER — Ambulatory Visit: Payer: Self-pay | Admitting: *Deleted

## 2017-05-31 DIAGNOSIS — B373 Candidiasis of vulva and vagina: Secondary | ICD-10-CM

## 2017-05-31 DIAGNOSIS — B3731 Acute candidiasis of vulva and vagina: Secondary | ICD-10-CM

## 2017-05-31 DIAGNOSIS — J020 Streptococcal pharyngitis: Secondary | ICD-10-CM

## 2017-05-31 MED ORDER — FLUCONAZOLE 150 MG PO TABS: 150.0000 mg | ORAL_TABLET | Freq: Once | ORAL | 0 refills | Status: AC

## 2017-05-31 MED ORDER — AMOXICILLIN 500 MG PO CAPS: 500.0000 mg | ORAL_CAPSULE | Freq: Two times a day (BID) | ORAL | 0 refills | Status: DC

## 2017-05-31 NOTE — Progress Notes (Signed)
Thank you for the details you included in the comment boxes. Those details are very helpful in determining the best course of treatment for you and help us to provide the best care. After our telephone discussion, it appears you likely have bacterial pharyngitis, likely strep. The treatment I'm sending will treat both strep and other type of bacterial pharyngitis so you'll be covered either way. I'm prescribing Amoxicillin 500mg  every 12 hours for 10 days. Also sending Diflucan 150mg  once for potential yeast infection if needed.   E visit for Flu like symptoms   We are sorry that you are not feeling well.  Here is how we plan to help! Based on what you have shared with me it looks like you may have flu-like symptoms that should be watched but do not seem to indicate anti-viral treatment.  Influenza or "the flu" is   an infection caused by a respiratory virus. The flu virus is highly contagious and persons who did not receive their yearly flu vaccination may "catch" the flu from close contact.  We have anti-viral medications to treat the viruses that cause this infection. They are not a "cure" and only shorten the course of the infection. These prescriptions are most effective when they are given within the first 2 days of "flu" symptoms. Antiviral medication are indicated if you have a high risk of complications from the flu. You should  also consider an antiviral medication if you are in close contact with someone who is at risk. These medications can help patients avoid complications from the flu  but have side effects that you should know. Possible side effects from Tamiflu or oseltamivir include nausea, vomiting, diarrhea, dizziness, headaches, eye redness, sleep problems or other respiratory symptoms. You should not take Tamiflu if you have an allergy to oseltamivir or any to the ingredients in Tamiflu.  Based upon your symptoms and potential risk factors (As mentioned above, this is likely strep,  given the white spots in your throat and 2 family members with strep).   ANYONE WHO HAS FLU SYMPTOMS SHOULD: . Stay home. The flu is highly contagious and going out or to work exposes others! . Be sure to drink plenty of fluids. Water is fine as well as fruit juices, sodas and electrolyte beverages. You may want to stay away from caffeine or alcohol. If you are nauseated, try taking small sips of liquids. How do you know if you are getting enough fluid? Your urine should be a pale yellow or almost colorless. . Get rest. . Taking a steamy shower or using a humidifier may help nasal congestion and ease sore throat pain. Using a saline nasal spray works much the same way. . Cough drops, hard candies and sore throat lozenges may ease your cough. . Line up a caregiver. Have someone check on you regularly.   GET HELP RIGHT AWAY IF: . You cannot keep down liquids or your medications. . You become short of breath . Your fell like you are going to pass out or loose consciousness. . Your symptoms persist after you have completed your treatment plan MAKE SURE YOU   Understand these instructions.  Will watch your condition.  Will get help right away if you are not doing well or get worse.  Your e-visit answers were reviewed by a board certified advanced clinical practitioner to complete your personal care plan.  Depending on the condition, your plan could have included both over the counter or prescription medications.  If there is  a problem please reply  once you have received a response from your provider.  Your safety is important to us.  If you have drug allergies check your prescription carefully.    You can use MyChart to ask questions about today's visit, request a non-urgent call back, or ask for a work or school excuse for 24 hours related to this e-Visit. If it has been greater than 24 hours you will need to follow up with your provider, or enter a new e-Visit to address those  concerns.  You will get an e-mail in the next two days asking about your experience.  I hope that your e-visit has been valuable and will speed your recovery. Thank you for using e-visits.

## 2017-05-31 NOTE — Telephone Encounter (Signed)
  Reason for Disposition . SEVERE (e.g., excruciating) throat pain  Answer Assessment - Initial Assessment Questions 1. STREP EXPOSURE: "Was the exposure to someone who lives within your home?" If not, ask: "How much contact did you have with the sick individual?"      Yes- yes- daughter and husband 2. ONSET: "How many days ago did the contact occur?"      famiy members where diagnosed 11/30 3. PROVEN STREP: "Are you sure the person with strep had a positive throat culture or rapid strep test?"      Done at office- probably rapid strep 4. STREP SYMPTOMS: "Do YOU have a sore throat, fever, or other symptoms suggestive of strep?"      Sore throat, low grade fever-100.0 5. VIRAL SYMPTOMS: "Are there any symptoms of a cold, such as a runny nose, cough, hoarse voice?"     no 6. PREGNANCY: "Is there any chance you are pregnant?" "When was your last menstrual period?"     No- LMP- 2 weeks ago    Patient has 2 family members with diagnosed strep- she has had a sore throat since Saturday that is getting worse. Patient advised to use E-visit for evaluation and treatment since office is closed at this time due to weather. Patient agrees- she does [facility] a [facility] that is close to her home and she will check to make sure they are open before traveling out. Patient to call back if she has any problems with E-visit.  Protocols used: SORE THROAT-A-AH, STREP THROAT EXPOSURE-A-AH

## 2017-06-01 NOTE — Telephone Encounter (Signed)
Routing to provider  

## 2017-06-01 NOTE — Telephone Encounter (Signed)
Patient did e-visit, just an [place].

## 2017-06-23 ENCOUNTER — Other Ambulatory Visit: Payer: Self-pay | Admitting: Unknown Physician Specialty

## 2017-07-02 DIAGNOSIS — Z1231 Encounter for screening mammogram for malignant neoplasm of breast: Secondary | ICD-10-CM | POA: Diagnosis not present

## 2017-07-02 LAB — HM MAMMOGRAPHY

## 2017-08-05 ENCOUNTER — Other Ambulatory Visit: Payer: Self-pay | Admitting: Unknown Physician Specialty

## 2017-08-05 NOTE — Telephone Encounter (Signed)
Imitrex 50 mg tab Last OV 02/21/16 with Roosvelt Maser[address] where her migraines are addressed. [facility] [place]  [address]

## 2017-09-17 ENCOUNTER — Other Ambulatory Visit: Payer: Self-pay | Admitting: Podiatry

## 2017-09-17 ENCOUNTER — Ambulatory Visit (INDEPENDENT_AMBULATORY_CARE_PROVIDER_SITE_OTHER): Payer: 59

## 2017-09-17 ENCOUNTER — Encounter: Payer: Self-pay | Admitting: Podiatry

## 2017-09-17 ENCOUNTER — Ambulatory Visit: Payer: 59 | Admitting: Podiatry

## 2017-09-17 DIAGNOSIS — R600 Localized edema: Secondary | ICD-10-CM

## 2017-09-17 DIAGNOSIS — M84375A Stress fracture, left foot, initial encounter for fracture: Secondary | ICD-10-CM

## 2017-09-17 DIAGNOSIS — M79672 Pain in left foot: Secondary | ICD-10-CM

## 2017-09-17 MED ORDER — MELOXICAM 15 MG PO TABS: 15.0000 mg | ORAL_TABLET | Freq: Every day | ORAL | 0 refills | Status: DC

## 2017-09-17 MED ORDER — METHYLPREDNISOLONE 4 MG PO TBPK: ORAL_TABLET | ORAL | 0 refills | Status: DC

## 2017-09-22 NOTE — Progress Notes (Signed)
   HPI: 42 year old female presenting today as a new patient with a chief complaint of intermittent pain to the dorsal aspect of the left foot that began 5 weeks ago. She reports associated swelling of the area. She has been wearing an old surgical shoe for treatment. Bearing weight and walking increases the pain. She denies any known trauma or injury. Patient is here for further evaluation and treatment.   Past Medical History:  Diagnosis Date  . Depression   . Dermatitis 2012  . Eczema   . Perioral dermatitis      Physical Exam: General: The patient is alert and oriented x3 in no acute distress.  Dermatology: Skin is warm, dry and supple bilateral lower extremities. Negative for open lesions or macerations.  Vascular: Palpable pedal pulses bilaterally. No erythema noted. Capillary refill within normal limits.  Neurological: Epicritic and protective threshold grossly intact bilaterally.   Musculoskeletal Exam: Pain with palpation to the 2nd metatarsal of the left foot. Swelling noted to the dorsum of the left foot. Range of motion within normal limits to all pedal and ankle joints bilateral. Muscle strength 5/5 in all groups bilateral.   Radiographic Exam:  Normal osseous mineralization. No evidence of stress fracture. Findings based on clinical exam.   Assessment: 1. Stress fracture 2nd metatarsal left 2. Soft tissue edema left    Plan of Care:  1. Patient evaluated. X-Rays reviewed.  2. Short CAM boot dispensed. Weightbearing as tolerated for 4 weeks.  3. Prescription for Medrol Dose Pak provided to patient.  4. Prescription for Mobic provided to patient.  5. Return to clinic in 4 weeks.       Felecia ShellingBrent M. Lattie Cervi, DPM [facility]  Dr. Felecia ShellingBrent M. Yasmine Kilbourne, DPM    [address]                                        [place], [place] [address]                Office [phone]  Fax [phone]

## 2017-10-08 NOTE — Progress Notes (Signed)
This encounter was created in error - please disregard.

## 2017-10-15 ENCOUNTER — Ambulatory Visit: Payer: 59 | Admitting: Podiatry

## 2017-12-24 ENCOUNTER — Other Ambulatory Visit: Payer: Self-pay | Admitting: Unknown Physician Specialty

## 2017-12-24 MED ORDER — DULOXETINE HCL 60 MG PO CPEP: 60.0000 mg | ORAL_CAPSULE | Freq: Every day | ORAL | 1 refills | Status: DC

## 2017-12-24 NOTE — Telephone Encounter (Signed)
Appointment scheduled 01/17/18 10:45AM

## 2017-12-24 NOTE — Addendum Note (Signed)
Addended by: Gabriel CirriWICKER, Loyal Rudy on: 12/24/2017 04:58 PM   Modules accepted: Orders

## 2017-12-24 NOTE — Telephone Encounter (Signed)
Cymbalta 60 mg refill request  LOV 11/25/16 with Gabriel Cirriheryl Wicker.  Had appt 11/30/16 cancelled by pt.  No future appts noted.  Last refill:  06/23/17   #90   1 refill  [facility] [place]

## 2018-01-12 ENCOUNTER — Encounter: Payer: Self-pay | Admitting: Unknown Physician Specialty

## 2018-01-12 ENCOUNTER — Ambulatory Visit: Payer: Self-pay | Admitting: Unknown Physician Specialty

## 2018-01-12 ENCOUNTER — Ambulatory Visit: Payer: 59 | Admitting: Unknown Physician Specialty

## 2018-01-12 VITALS — BP 124/83 | HR 102 | Temp 98.3°F | Ht 65.0 in | Wt 191.5 lb

## 2018-01-12 DIAGNOSIS — B029 Zoster without complications: Secondary | ICD-10-CM | POA: Diagnosis not present

## 2018-01-12 MED ORDER — VALACYCLOVIR HCL 1 G PO TABS: 1000.0000 mg | ORAL_TABLET | Freq: Two times a day (BID) | ORAL | 0 refills | Status: DC

## 2018-01-12 MED ORDER — BETAMETHASONE DIPROPIONATE AUG 0.05 % EX CREA: TOPICAL_CREAM | Freq: Two times a day (BID) | CUTANEOUS | 0 refills | Status: DC

## 2018-01-12 NOTE — Patient Instructions (Signed)
Shingles Shingles, which is also known as herpes zoster, is an infection that causes a painful skin rash and fluid-filled blisters. Shingles is not related to genital herpes, which is a sexually transmitted infection. Shingles only develops in people who:  Have had chickenpox.  Have received the chickenpox vaccine. (This is rare.)  What are the causes? Shingles is caused by varicella-zoster virus (VZV). This is the same virus that causes chickenpox. After exposure to VZV, the virus stays in the body in an inactive (dormant) state. Shingles develops if the virus reactivates. This can happen many years after the initial exposure to VZV. It is not known what causes this virus to reactivate. What increases the risk? People who have had chickenpox or received the chickenpox vaccine are at risk for shingles. Infection is more common in people who:  Are older than age 50.  Have a weakened defense (immune) system, such as those with HIV, AIDS, or cancer.  Are taking medicines that weaken the immune system, such as transplant medicines.  Are under great stress.  What are the signs or symptoms? Early symptoms of this condition include itching, tingling, and pain in an area on your skin. Pain may be described as burning, stabbing, or throbbing. A few days or weeks after symptoms start, a painful red rash appears, usually on one side of the body in a bandlike or beltlike pattern. The rash eventually turns into fluid-filled blisters that break open, scab over, and dry up in about 2-3 weeks. At any time during the infection, you may also develop:  A fever.  Chills.  A headache.  An upset stomach.  How is this diagnosed? This condition is diagnosed with a skin exam. Sometimes, skin or fluid samples are taken from the blisters before a diagnosis is made. These samples are examined under a microscope or sent to a lab for testing. How is this treated? There is no specific cure for this condition.  Your health care provider will probably prescribe medicines to help you manage pain, recover more quickly, and avoid long-term problems. Medicines may include:  Antiviral drugs.  Anti-inflammatory drugs.  Pain medicines.  If the area involved is on your face, you may be referred to a specialist, such as an eye doctor (ophthalmologist) or an ear, nose, and throat (ENT) doctor to help you avoid eye problems, chronic pain, or disability. Follow these instructions at home: Medicines  Take medicines only as directed by your health care provider.  Apply an anti-itch or numbing cream to the affected area as directed by your health care provider. Blister and Rash Care  Take a cool bath or apply cool compresses to the area of the rash or blisters as directed by your health care provider. This may help with pain and itching.  Keep your rash covered with a loose bandage (dressing). Wear loose-fitting clothing to help ease the pain of material rubbing against the rash.  Keep your rash and blisters clean with mild soap and cool water or as directed by your health care provider.  Check your rash every day for signs of infection. These include redness, swelling, and pain that lasts or increases.  Do not pick your blisters.  Do not scratch your rash. General instructions  Rest as directed by your health care provider.  Keep all follow-up visits as directed by your health care provider. This is important.  Until your blisters scab over, your infection can cause chickenpox in people who have never had it or been vaccinated   against it. To prevent this from happening, avoid contact with other people, especially: ? Babies. ? Pregnant women. ? Children who have eczema. ? Elderly people who have transplants. ? People who have chronic illnesses, such as leukemia or AIDS. Contact a health care provider if:  Your pain is not relieved with prescribed medicines.  Your pain does not get better after  the rash heals.  Your rash looks infected. Signs of infection include redness, swelling, and pain that lasts or increases. Get help right away if:  The rash is on your face or nose.  You have facial pain, pain around your eye area, or loss of feeling on one side of your face.  You have ear pain or you have ringing in your ear.  You have loss of taste.  Your condition gets worse. This information is not intended to replace advice given to you by your health care provider. Make sure you discuss any questions you have with your health care provider. Document Released: 06/08/2005 Document Revised: 02/02/2016 Document Reviewed: 04/19/2014 Elsevier Interactive Patient Education  2018 Elsevier Inc.  

## 2018-01-12 NOTE — Progress Notes (Signed)
   BP 124/83   Pulse (!) 102   Temp 98.3 F (36.8 C) (Oral)   Ht 5\' 5"  (1.651 m)   Wt 191 lb 8 oz (86.9 kg)   SpO2 97%   BMI 31.87 kg/m    Subjective:    Patient ID: Tammy Stout, female    DOB: [birth year], 42 y.o.   MRN: [id number]  HPI: Tammy Stout is a 42 y.o. female  Chief Complaint  Patient presents with  . Urticaria    pt states she has hives on her face that came up Monday morning   Pt with sudden onset of rash on right side of face.  States it hurts and itches.  No fever.  Wonders if she at something.  No new contacts.  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    Relevant past medical, surgical, family and social history reviewed and updated as indicated. Interim medical history since our last visit reviewed. Allergies and medications reviewed and updated.  Review of Systems  Per HPI unless specifically indicated above     Objective:    BP 124/83   Pulse (!) 102   Temp 98.3 F (36.8 C) (Oral)   Ht 5\' 5"  (1.651 m)   Wt 191 lb 8 oz (86.9 kg)   SpO2 97%   BMI 31.87 kg/m   Wt Readings from Last 3 Encounters:  01/12/18 191 lb 8 oz (86.9 kg)  11/25/16 167 lb (75.8 kg)  09/22/16 176 lb 12.8 oz (80.2 kg)    Physical Exam  Constitutional: She is oriented to person, place, and time. She appears well-developed and well-nourished. No distress.  HENT:  Head: Normocephalic and atraumatic.  Eyes: Conjunctivae and lids are normal. Right eye exhibits no discharge. Left eye exhibits no discharge. No scleral icterus.  Neck: Normal range of motion. Neck supple. No JVD present. Carotid bruit is not present.  Cardiovascular: Normal rate, regular rhythm and normal heart sounds.  Pulmonary/Chest: Effort normal and breath sounds normal.  Abdominal: Normal appearance. There is no splenomegaly or hepatomegaly.  Musculoskeletal: Normal range of motion.  Neurological: She is alert and oriented to person, place, and time.  Skin: Skin is warm, dry and intact. No rash noted. No  pallor.  Psychiatric: She has a normal mood and affect. Her behavior is normal. Judgment and thought content normal.    Results for orders placed or performed in visit on 07/02/17  HM MAMMOGRAPHY  Result Value Ref Range   HM Mammogram 0-4 Bi-Rad 0-4 Bi-Rad, Self Reported Normal      Assessment & Plan:   Problem List Items Addressed This Visit    None    Visit Diagnoses    Herpes zoster without complication    -  Primary   Pt with right facial shingles.  Pt ed on care.  Valtrex 1 gram TID for 7 days.  Topical steroid cream for the itching   Relevant Medications   valACYclovir (VALTREX) 1000 MG tablet       Follow up plan: Return if symptoms worsen or fail to improve.

## 2018-01-12 NOTE — Telephone Encounter (Signed)
Called in c/o having hives on her right cheek that started Monday morning.   It is itching terribly.  See triage notes.  I scheduled her with Gabriel Cirriheryl Wicker, FNP for today at 3:30.    Reason for Disposition . [1] MODERATE-SEVERE hives persist (i.e., hives interfere with normal activities or work) AND [2] taking antihistamine (e.g., Benadryl, Claritin) > 24 hours  Answer Assessment - Initial Assessment Questions 1. APPEARANCE: "What does the rash look like?"      Started Monday morning.   On right check.   Itching and sore worse since then. 2. LOCATION: "Where is the rash located?"      Right cheek.    I've had hives for various  on other parts of my body over the years but never on my face. 3. NUMBER: "How many hives are there?"      Clustered together maybe 5 of them.  Nickel sized. 4. SIZE: "How big are the hives?" (inches, cm, compare to coins) "Do they all look the same or is there lots of variation in shape and size?"      Nickel sized.   No swelling in mouth, tongue or lips. 5. ONSET: "When did the hives begin?" (Hours or days ago)      Monday morning. 6. ITCHING: "Does it itch?" If so, ask: "How bad is the itch?"    - MILD: doesn't interfere with normal activities   - MODERATE - SEVERE: interferes with work, school, sleep, or other activities      Yes.  It itches terribly.     I ate some tomatoes Sunday fresh from my garden.   Only thing different I've eaten.   Did not use pesticides on my tomatoes. 7. RECURRENT PROBLEM: "Have you had hives before?" If so, ask: "When was the last time?" and "What happened that time?"      Yes.   Over the years various places on my body. 8. TRIGGERS: "Were you exposed to any new food, plant, cosmetic product or animal just before the hives began?"     Nothing else has changed.  I'm not using anything new or different. 9. OTHER SYMPTOMS: "Do you have any other symptoms?" (e.g., fever, tongue swelling, difficulty breathing, abdominal pain)     No 10.  PREGNANCY: "Is there any chance you are pregnant?" "When was your last menstrual period?"       No.  Protocols used: HIVES-A-AH

## 2018-01-17 ENCOUNTER — Ambulatory Visit (INDEPENDENT_AMBULATORY_CARE_PROVIDER_SITE_OTHER): Payer: 59 | Admitting: Unknown Physician Specialty

## 2018-01-17 ENCOUNTER — Other Ambulatory Visit (HOSPITAL_COMMUNITY)
Admission: RE | Admit: 2018-01-17 | Discharge: 2018-01-17 | Disposition: A | Discharge status: Home / Self Care | Payer: 59 | Source: Ambulatory Visit | Attending: Unknown Physician Specialty | Admitting: Unknown Physician Specialty

## 2018-01-17 ENCOUNTER — Encounter: Payer: Self-pay | Admitting: Unknown Physician Specialty

## 2018-01-17 VITALS — BP 114/80 | HR 113 | Temp 99.1°F | Ht 65.0 in | Wt 189.0 lb

## 2018-01-17 DIAGNOSIS — L989 Disorder of the skin and subcutaneous tissue, unspecified: Secondary | ICD-10-CM | POA: Diagnosis not present

## 2018-01-17 DIAGNOSIS — F325 Major depressive disorder, single episode, in full remission: Secondary | ICD-10-CM

## 2018-01-17 DIAGNOSIS — L821 Other seborrheic keratosis: Secondary | ICD-10-CM | POA: Diagnosis not present

## 2018-01-17 DIAGNOSIS — B029 Zoster without complications: Secondary | ICD-10-CM | POA: Diagnosis not present

## 2018-01-17 DIAGNOSIS — G43829 Menstrual migraine, not intractable, without status migrainosus: Secondary | ICD-10-CM

## 2018-01-17 MED ORDER — SUMATRIPTAN SUCCINATE 50 MG PO TABS: 50.0000 mg | ORAL_TABLET | ORAL | 2 refills | Status: DC | PRN

## 2018-01-17 NOTE — Assessment & Plan Note (Signed)
Stable, continue present medications.   

## 2018-01-17 NOTE — Progress Notes (Signed)
BP 114/80   Pulse (!) 113   Temp 99.1 F (37.3 C) (Oral)   Ht 5\' 5"  (1.651 m)   Wt 189 lb (85.7 kg)   SpO2 98%   BMI 31.45 kg/m    Subjective:    Patient ID: Tammy Stout, female    DOB: 1975/11/04, 42 y.o.   MRN: [id number]  HPI: Tammy Stout is a 42 y.o. female  Chief Complaint  Patient presents with  . Depression  . Herpes Zoster    f/up from last week    Shingles Shingles on right side of face seems to be better.  Sharp stabbing pain for 2 days is now improved.    Depression Doing well on current dose of Cymbalta  Would like refills.   Depression screen [facility] 2/9 01/17/2018 01/12/2018 11/25/2016 05/29/2016 12/17/2015  Decreased Interest 0 0 0 0 0  Down, Depressed, Hopeless 0 2 0 0 0  PHQ - 2 Score 0 2 0 0 0  Altered sleeping 0 0 - 0 -  Tired, decreased energy 0 0 - 0 -  Change in appetite 0 0 - 0 -  Feeling bad or failure about yourself  0 0 - 0 -  Trouble concentrating 0 0 - 0 -  Moving slowly or fidgety/restless 0 0 - 0 -  Suicidal thoughts 0 0 - 0 -  PHQ-9 Score 0 2 - 0 -   Migraines Typically monthly.  Managed with Imtrex.    Moles Pt with 2 new moles that just appeared 3 months ago.  Both on upper right thigh  Relevant past medical, surgical, family and social history reviewed and updated as indicated. Interim medical history since our last visit reviewed. Allergies and medications reviewed and updated.  Review of Systems  Per HPI unless specifically indicated above     Objective:    BP 114/80   Pulse (!) 113   Temp 99.1 F (37.3 C) (Oral)   Ht 5\' 5"  (1.651 m)   Wt 189 lb (85.7 kg)   SpO2 98%   BMI 31.45 kg/m   Wt Readings from Last 3 Encounters:  01/17/18 189 lb (85.7 kg)  01/12/18 191 lb 8 oz (86.9 kg)  11/25/16 167 lb (75.8 kg)    Physical Exam  Constitutional: She is oriented to person, place, and time. She appears well-developed and well-nourished. No distress.  HENT:  Head: Normocephalic and atraumatic.  Eyes: Conjunctivae  and lids are normal. Right eye exhibits no discharge. Left eye exhibits no discharge. No scleral icterus.  Neck: Normal range of motion. Neck supple. No JVD present. Carotid bruit is not present.  Cardiovascular: Normal rate, regular rhythm and normal heart sounds.  Pulmonary/Chest: Effort normal and breath sounds normal.  Abdominal: Normal appearance. There is no splenomegaly or hepatomegaly.  Musculoskeletal: Normal range of motion.  Neurological: She is alert and oriented to person, place, and time.  Skin: Skin is warm, dry and intact. No rash noted. No pallor.  2 moles right upper thigh  Psychiatric: She has a normal mood and affect. Her behavior is normal. Judgment and thought content normal.    Results for orders placed or performed in visit on 07/02/17  HM MAMMOGRAPHY  Result Value Ref Range   HM Mammogram 0-4 Bi-Rad 0-4 Bi-Rad, Self Reported Normal      Assessment & Plan:   Problem List Items Addressed This Visit      Unprioritized   Depression    Stable, continue present medications.  Migraine    Stable, continue present medications.        Relevant Medications   SUMAtriptan (IMITREX) 50 MG tablet    Other Visit Diagnoses    Herpes zoster without complication    -  Primary   Improving with current treatment.     Skin lesion       Shave biopsy x2  I suspect seb keratosis.  Send for pathology   Relevant Orders   Dermatology pathology   Dermatology pathology       Follow up plan: Return in about 6 months (around 07/20/2018) for physical.

## 2018-01-18 ENCOUNTER — Telehealth: Payer: Self-pay | Admitting: Unknown Physician Specialty

## 2018-01-18 NOTE — Telephone Encounter (Signed)
Copied from CRM [id number]. Topic: Quick Communication - See Telephone Encounter >> Jan 18, 2018  9:05 AM Maia Pettiesrtiz, Kristie S wrote: CRM for notification. See Telephone encounter for: 01/18/18.  Mandy received sample from dermatology pathology taken 01/17/18. There are 2 orders in the system. Please delete one and call Angelica ChessmanMandy to notify her which one was deleted.  They only need to use 1 order.  I will delete one

## 2018-01-18 NOTE — Telephone Encounter (Signed)
Called [facility] and provided them with the order number that is still in the chart.

## 2018-01-18 NOTE — Addendum Note (Signed)
Addended by: Gabriel CirriWICKER, Meade Hogeland on: 01/18/2018 12:05 PM   Modules accepted: Orders

## 2018-01-26 ENCOUNTER — Encounter: Payer: Self-pay | Admitting: Family Medicine

## 2018-01-26 ENCOUNTER — Ambulatory Visit: Payer: 59 | Admitting: Family Medicine

## 2018-01-26 VITALS — BP 131/83 | HR 104 | Temp 98.3°F | Ht 65.0 in | Wt 193.3 lb

## 2018-01-26 DIAGNOSIS — T148XXA Other injury of unspecified body region, initial encounter: Secondary | ICD-10-CM | POA: Diagnosis not present

## 2018-01-26 NOTE — Progress Notes (Signed)
   BP 131/83   Pulse (!) 104   Temp 98.3 F (36.8 C) (Oral)   Ht 5\' 5"  (1.651 m)   Wt 193 lb 4.8 oz (87.7 kg)   SpO2 98%   BMI 32.17 kg/m    Subjective:    Patient ID: Tammy Stout, female    DOB: 25-Apr-1976, 42 y.o.   MRN: [id number]  HPI: Tammy Stout is a 42 y.o. female  Chief Complaint  Patient presents with  . Wound Check    pt had 2 moles removed on inner right thigh 01/17/18, states one of the places is bleeding and oozing.    Pt here today for wound check of 2 moles s/p shave bx over a week ago. States one is healing well but the larger wound started draining green thick drainage and becoming more painful the past day. Denies fevers, chills, sweats. Dressing with neosporin daily.   Relevant past medical, surgical, family and social history reviewed and updated as indicated. Interim medical history since our last visit reviewed. Allergies and medications reviewed and updated.  Review of Systems  Per HPI unless specifically indicated above     Objective:    BP 131/83   Pulse (!) 104   Temp 98.3 F (36.8 C) (Oral)   Ht 5\' 5"  (1.651 m)   Wt 193 lb 4.8 oz (87.7 kg)   SpO2 98%   BMI 32.17 kg/m   Wt Readings from Last 3 Encounters:  01/26/18 193 lb 4.8 oz (87.7 kg)  01/17/18 189 lb (85.7 kg)  01/12/18 191 lb 8 oz (86.9 kg)    Physical Exam  Constitutional: She is oriented to person, place, and time. She appears well-developed and well-nourished. No distress.  HENT:  Head: Atraumatic.  Eyes: Conjunctivae and EOM are normal.  Neck: Normal range of motion. Neck supple.  Cardiovascular: Normal rate and regular rhythm.  Pulmonary/Chest: Effort normal and breath sounds normal.  Musculoskeletal: Normal range of motion.  Neurological: She is alert and oriented to person, place, and time.  Skin: Skin is warm and dry.  Two shave bx wounds with granulation tissue growth. No active drainage or surrounding cellulitis  Psychiatric: She has a normal mood and  affect. Her behavior is normal.  Nursing note and vitals reviewed.   Results for orders placed or performed in visit on 07/02/17  HM MAMMOGRAPHY  Result Value Ref Range   HM Mammogram 0-4 Bi-Rad 0-4 Bi-Rad, Self Reported Normal      Assessment & Plan:   Problem List Items Addressed This Visit    None    Visit Diagnoses    Wound discharge    -  Primary   Reassurance given, no evidence of active infection. Continue good wound care with neosporin and antibacterial soap and water. Return precautions reviewed       Follow up plan: Return if symptoms worsen or fail to improve.

## 2018-01-30 NOTE — Patient Instructions (Signed)
Follow up as needed

## 2018-02-24 ENCOUNTER — Other Ambulatory Visit: Payer: Self-pay | Admitting: Unknown Physician Specialty

## 2018-03-21 ENCOUNTER — Other Ambulatory Visit: Payer: Self-pay | Admitting: Unknown Physician Specialty

## 2018-04-11 ENCOUNTER — Other Ambulatory Visit: Payer: Self-pay | Admitting: Unknown Physician Specialty

## 2018-04-11 MED ORDER — DULOXETINE HCL 60 MG PO CPEP: ORAL_CAPSULE | ORAL | 3 refills | Status: DC

## 2018-04-11 NOTE — Telephone Encounter (Signed)
Requested Prescriptions  Pending Prescriptions Disp Refills  . DULoxetine (CYMBALTA) 60 MG capsule 30 capsule 3    Sig: TAKE ONE (1) CAPSULE EACH DAY.     Psychiatry: Antidepressants - SNRI Passed - 04/11/2018  7:56 AM      Passed - Completed PHQ-2 or PHQ-9 in the last 360 days.      Passed - Last BP in normal range    BP Readings from Last 1 Encounters:  01/26/18 131/83         Passed - Valid encounter within last 6 months    Recent Outpatient Visits          2 months ago Wound discharge   [facility] Particia Nearing, [place]   2 months ago Herpes zoster without complication   [facility] Gabriel Cirri, NP   2 months ago Herpes zoster without complication   [facility] Gabriel Cirri, NP   1 year ago Routine general medical examination at a health care facility   [facility] Gabriel Cirri, NP   1 year ago Cough   [facility] Gabriel Cirri, NP

## 2018-04-11 NOTE — Telephone Encounter (Signed)
Copied from CRM [id number]. Topic: Quick Communication - Rx Refill/Question >> Apr 11, 2018  7:40 AM Gloriann Loan L wrote: Medication: DULoxetine (CYMBALTA) 60 MG capsule 90 day refill  Has the patient contacted their pharmacy? No. First time using this pharmacy (Agent: If no, request that the patient contact the pharmacy for the refill.) (Agent: If yes, when and what did the pharmacy advise?)  Preferred Pharmacy (with phone number or street name): [facility] - [place], [place] - [address] [phone] (Phone) [phone] (Fax)    Agent: Please be advised that RX refills may take up to 3 business days. We ask that you follow-up with your pharmacy.

## 2018-04-26 ENCOUNTER — Ambulatory Visit: Payer: 59 | Admitting: Family Medicine

## 2018-07-01 ENCOUNTER — Encounter: Payer: Self-pay | Admitting: Unknown Physician Specialty

## 2018-07-01 ENCOUNTER — Ambulatory Visit (INDEPENDENT_AMBULATORY_CARE_PROVIDER_SITE_OTHER): Payer: 59 | Admitting: Unknown Physician Specialty

## 2018-07-01 VITALS — BP 129/76 | HR 99 | Temp 98.3°F | Wt 195.0 lb

## 2018-07-01 DIAGNOSIS — J0100 Acute maxillary sinusitis, unspecified: Secondary | ICD-10-CM | POA: Diagnosis not present

## 2018-07-01 DIAGNOSIS — J069 Acute upper respiratory infection, unspecified: Secondary | ICD-10-CM | POA: Diagnosis not present

## 2018-07-01 MED ORDER — AMOXICILLIN 875 MG PO TABS: 875.0000 mg | ORAL_TABLET | Freq: Two times a day (BID) | ORAL | 0 refills | Status: DC

## 2018-07-01 NOTE — Progress Notes (Signed)
BP 129/76   Pulse 99   Temp 98.3 F (36.8 C) (Oral)   Wt 195 lb (88.5 kg)   SpO2 99%   BMI 32.45 kg/m    Subjective:    Patient ID: Tammy Stout, female    DOB: 1976-06-11, 42 y.o.   MRN: [id number]  HPI: Tammy Stout is a 43 y.o. female  Chief Complaint  Patient presents with  . Headache    x a few days. Denies any fevers.   . Ear Pain   URI   This is a new problem. Episode onset: 4 days. The problem has been gradually worsening. There has been no fever. Associated symptoms include congestion, coughing, ear pain, headaches, neck pain, rhinorrhea, sinus pain and a sore throat. Pertinent negatives include no abdominal pain, chest pain, diarrhea, dysuria, joint pain, joint swelling, nausea, plugged ear sensation, rash, sneezing, swollen glands, vomiting or wheezing. She has tried antihistamine and decongestant for the symptoms. The treatment provided moderate relief.    Relevant past medical, surgical, family and social history reviewed and updated as indicated. Interim medical history since our last visit reviewed. Allergies and medications reviewed and updated.  Review of Systems  HENT: Positive for congestion, ear pain, rhinorrhea, sinus pain and sore throat. Negative for sneezing.   Respiratory: Positive for cough. Negative for wheezing.   Cardiovascular: Negative for chest pain.  Gastrointestinal: Negative for abdominal pain, diarrhea, nausea and vomiting.  Genitourinary: Negative for dysuria.  Musculoskeletal: Positive for neck pain. Negative for joint pain.  Skin: Negative for rash.  Neurological: Positive for headaches.    Per HPI unless specifically indicated above     Objective:    BP 129/76   Pulse 99   Temp 98.3 F (36.8 C) (Oral)   Wt 195 lb (88.5 kg)   SpO2 99%   BMI 32.45 kg/m   Wt Readings from Last 3 Encounters:  07/01/18 195 lb (88.5 kg)  01/26/18 193 lb 4.8 oz (87.7 kg)  01/17/18 189 lb (85.7 kg)    Physical Exam Constitutional:        General: She is not in acute distress.    Appearance: Normal appearance. She is well-developed.  HENT:     Head: Normocephalic and atraumatic.     Right Ear: Tympanic membrane and ear canal normal.     Left Ear: Tympanic membrane and ear canal normal.     Nose: Rhinorrhea present.     Right Sinus: No maxillary sinus tenderness or frontal sinus tenderness.     Left Sinus: No maxillary sinus tenderness or frontal sinus tenderness.     Mouth/Throat:     Pharynx: Posterior oropharyngeal erythema present.  Eyes:     General: Lids are normal. No scleral icterus.       Right eye: No discharge.        Left eye: No discharge.     Conjunctiva/sclera: Conjunctivae normal.  Cardiovascular:     Rate and Rhythm: Normal rate and regular rhythm.  Pulmonary:     Effort: Pulmonary effort is normal. No respiratory distress.     Breath sounds: Normal breath sounds.  Abdominal:     Palpations: There is no hepatomegaly or splenomegaly.  Musculoskeletal: Normal range of motion.  Skin:    Coloration: Skin is not pale.     Findings: No rash.  Neurological:     Mental Status: She is alert and oriented to person, place, and time.  Psychiatric:  Behavior: Behavior normal.        Thought Content: Thought content normal.        Judgment: Judgment normal.     Results for orders placed or performed in visit on 07/02/17  HM MAMMOGRAPHY  Result Value Ref Range   HM Mammogram 0-4 Bi-Rad 0-4 Bi-Rad, Self Reported Normal      Assessment & Plan:   Problem List Items Addressed This Visit    None    Visit Diagnoses    Viral upper respiratory tract infection    -  Primary   Discussed trajectory of illness.  Supportive care.     Acute non-recurrent maxillary sinusitis       ? acute sinusitis secondary to virus.  Will rx antibiotic with instructions not to take unless continuing to feel worse.     Relevant Medications   amoxicillin (AMOXIL) 875 MG tablet       Follow up plan: Return if  symptoms worsen or fail to improve.

## 2018-07-26 ENCOUNTER — Encounter: Payer: 59 | Admitting: Nurse Practitioner

## 2018-08-02 ENCOUNTER — Encounter: Payer: 59 | Admitting: Nurse Practitioner

## 2018-08-02 ENCOUNTER — Ambulatory Visit (INDEPENDENT_AMBULATORY_CARE_PROVIDER_SITE_OTHER): Payer: 59 | Admitting: Family Medicine

## 2018-08-02 ENCOUNTER — Encounter: Payer: Self-pay | Admitting: Family Medicine

## 2018-08-02 ENCOUNTER — Other Ambulatory Visit: Payer: Self-pay

## 2018-08-02 VITALS — BP 117/78 | HR 99 | Temp 98.5°F | Ht 65.5 in | Wt 196.0 lb

## 2018-08-02 DIAGNOSIS — F325 Major depressive disorder, single episode, in full remission: Secondary | ICD-10-CM | POA: Diagnosis not present

## 2018-08-02 DIAGNOSIS — G43829 Menstrual migraine, not intractable, without status migrainosus: Secondary | ICD-10-CM | POA: Diagnosis not present

## 2018-08-02 DIAGNOSIS — Z23 Encounter for immunization: Secondary | ICD-10-CM | POA: Diagnosis not present

## 2018-08-02 DIAGNOSIS — Z0000 Encounter for general adult medical examination without abnormal findings: Secondary | ICD-10-CM

## 2018-08-02 MED ORDER — DULOXETINE HCL 60 MG PO CPEP: ORAL_CAPSULE | ORAL | 3 refills | Status: DC

## 2018-08-02 MED ORDER — SUMATRIPTAN SUCCINATE 50 MG PO TABS: 50.0000 mg | ORAL_TABLET | ORAL | 12 refills | Status: DC | PRN

## 2018-08-02 NOTE — Progress Notes (Signed)
BP 117/78   Pulse 99   Temp 98.5 F (36.9 C) (Oral)   Ht 5' 5.5" (1.664 m)   Wt 196 lb (88.9 kg)   SpO2 99%   BMI 32.12 kg/m    Subjective:    Patient ID: Tammy Stout, female    DOB: May 06, 1976, 42 y.o.   MRN: [id number]  HPI: Tammy Stout is a 43 y.o. female presenting on 08/02/2018 for comprehensive medical examination. Current medical complaints include:  DEPRESSION Mood status: controlled Satisfied with current treatment?: yes Symptom severity: mild  Duration of current treatment : chronic Side effects: no Medication compliance: excellent compliance Psychotherapy/counseling: no  Previous psychiatric medications: cymbalta Depressed mood: no Anxious mood: no Anhedonia: no Significant weight loss or gain: no Insomnia: no  Fatigue: no Feelings of worthlessness or guilt: no Impaired concentration/indecisiveness: no Suicidal ideations: no Hopelessness: no Crying spells: no Depression screen [facility] 2/9 08/02/2018 01/17/2018 01/12/2018 11/25/2016 05/29/2016  Decreased Interest 0 0 0 0 0  Down, Depressed, Hopeless 0 0 2 0 0  PHQ - 2 Score 0 0 2 0 0  Altered sleeping 2 0 0 - 0  Tired, decreased energy 0 0 0 - 0  Change in appetite 0 0 0 - 0  Feeling bad or failure about yourself  0 0 0 - 0  Trouble concentrating 0 0 0 - 0  Moving slowly or fidgety/restless 0 0 0 - 0  Suicidal thoughts 0 0 0 - 0  PHQ-9 Score 2 0 2 - 0  Difficult doing work/chores Not difficult at all - - - -   GAD 7 : Generalized Anxiety Score 08/02/2018  Nervous, Anxious, on Edge 0  Control/stop worrying 0  Worry too much - different things 0  Trouble relaxing 0  Restless 0  Easily annoyed or irritable 0  Afraid - awful might happen 0  Total GAD 7 Score 0   Menopausal Symptoms: no  Depression Screen done today and results listed below:  Depression screen [facility] 2/9 08/02/2018 01/17/2018 01/12/2018 11/25/2016 05/29/2016  Decreased Interest 0 0 0 0 0  Down, Depressed, Hopeless 0 0 2 0 0  PHQ - 2  Score 0 0 2 0 0  Altered sleeping 2 0 0 - 0  Tired, decreased energy 0 0 0 - 0  Change in appetite 0 0 0 - 0  Feeling bad or failure about yourself  0 0 0 - 0  Trouble concentrating 0 0 0 - 0  Moving slowly or fidgety/restless 0 0 0 - 0  Suicidal thoughts 0 0 0 - 0  PHQ-9 Score 2 0 2 - 0  Difficult doing work/chores Not difficult at all - - - -    Past Medical History:  Past Medical History:  Diagnosis Date  . Depression   . Dermatitis 2012  . Eczema   . Perioral dermatitis     Surgical History:  Past Surgical History:  Procedure Laterality Date  . CESAREAN SECTION  05/2006   SECTION DUE TO NO DIALATIONA AND CORD WAS AROUND BABY NECK    Medications:  Current Outpatient Medications on File Prior to Visit  Medication Sig  . cetirizine (ZYRTEC) 10 MG tablet Take 10 mg by mouth daily.   No current facility-administered medications on file prior to visit.     Allergies:  No Known Allergies  Social History:  Social History   Socioeconomic History  . Marital status: Married    Spouse name: Not on file  .  Number of children: Not on file  . Years of education: Not on file  . Highest education level: Not on file  Occupational History  . Not on file  Social Needs  . Financial resource strain: Not on file  . Food insecurity:    Worry: Not on file    Inability: Not on file  . Transportation needs:    Medical: Not on file    Non-medical: Not on file  Tobacco Use  . Smoking status: Never Smoker  . Smokeless tobacco: Never Used  Substance and Sexual Activity  . Alcohol use: Yes    Comment: social  . Drug use: No  . Sexual activity: Yes  Lifestyle  . Physical activity:    Days per week: Not on file    Minutes per session: Not on file  . Stress: Not on file  Relationships  . Social connections:    Talks on phone: Not on file    Gets together: Not on file    Attends religious service: Not on file    Active member of club or organization: Not on file     Attends meetings of clubs or organizations: Not on file    Relationship status: Not on file  . Intimate partner violence:    Fear of current or ex partner: Not on file    Emotionally abused: Not on file    Physically abused: Not on file    Forced sexual activity: Not on file  Other Topics Concern  . Not on file  Social History Narrative  . Not on file   Social History   Tobacco Use  Smoking Status Never Smoker  Smokeless Tobacco Never Used   Social History   Substance and Sexual Activity  Alcohol Use Yes   Comment: social    Family History:  Family History  Problem Relation Age of Onset  . Cancer Paternal Grandmother 74       kidney cancer  . Cancer Maternal Grandmother 80       lung cancer  . Diabetes Maternal Grandmother   . Diabetes Mother   . Cancer Mother 64       One breast  . Thyroid disease Father   . Hyperlipidemia Father   . Diabetes Maternal Grandfather   . Thyroid disease Maternal Aunt     Past medical history, surgical history, medications, allergies, family history and social history reviewed with patient today and changes made to appropriate areas of the chart.   Review of Systems  Constitutional: Negative.   HENT: Negative.   Eyes: Negative.   Respiratory: Negative.   Cardiovascular: Negative.   Gastrointestinal: Negative.   Genitourinary: Negative.   Musculoskeletal: Negative.   Skin: Negative.   Neurological: Positive for headaches. Negative for dizziness, tingling, tremors, sensory change, speech change, focal weakness, seizures, loss of consciousness and weakness.  Endo/Heme/Allergies: Positive for environmental allergies. Negative for polydipsia. Does not bruise/bleed easily.  Psychiatric/Behavioral: Negative.     All other ROS negative except what is listed above and in the HPI.      Objective:    BP 117/78   Pulse 99   Temp 98.5 F (36.9 C) (Oral)   Ht 5' 5.5" (1.664 m)   Wt 196 lb (88.9 kg)   SpO2 99%   BMI 32.12 kg/m     Wt Readings from Last 3 Encounters:  08/02/18 196 lb (88.9 kg)  07/01/18 195 lb (88.5 kg)  01/26/18 193 lb 4.8 oz (87.7 kg)  Physical Exam Vitals signs and nursing note reviewed. Exam conducted with a chaperone present.  Constitutional:      General: She is not in acute distress.    Appearance: Normal appearance. She is not ill-appearing, toxic-appearing or diaphoretic.  HENT:     Head: Normocephalic and atraumatic.     Right Ear: Tympanic membrane, ear canal and external ear normal. There is no impacted cerumen.     Left Ear: Tympanic membrane, ear canal and external ear normal. There is no impacted cerumen.     Nose: Nose normal. No congestion or rhinorrhea.     Mouth/Throat:     Mouth: Mucous membranes are moist.     Pharynx: Oropharynx is clear. No oropharyngeal exudate or posterior oropharyngeal erythema.  Eyes:     General: No scleral icterus.       Right eye: No discharge.        Left eye: No discharge.     Extraocular Movements: Extraocular movements intact.     Conjunctiva/sclera: Conjunctivae normal.     Pupils: Pupils are equal, round, and reactive to light.  Neck:     Musculoskeletal: Normal range of motion and neck supple. No neck rigidity or muscular tenderness.     Vascular: No carotid bruit.  Cardiovascular:     Rate and Rhythm: Normal rate and regular rhythm.     Pulses: Normal pulses.     Heart sounds: No murmur. No friction rub. No gallop.   Pulmonary:     Effort: Pulmonary effort is normal. No respiratory distress.     Breath sounds: Normal breath sounds. No stridor. No wheezing, rhonchi or rales.  Chest:     Chest wall: No tenderness.     Breasts:        Right: Normal. No swelling, bleeding, inverted nipple, mass, nipple discharge, skin change or tenderness.        Left: Normal. No swelling, bleeding, inverted nipple, mass, nipple discharge, skin change or tenderness.  Abdominal:     General: Abdomen is flat. Bowel sounds are normal. There is no  distension.     Palpations: Abdomen is soft. There is no mass.     Tenderness: There is no abdominal tenderness. There is no right CVA tenderness, left CVA tenderness, guarding or rebound.     Hernia: No hernia is present.  Genitourinary:    Comments: Pelvic exams deferred with shared decision making Musculoskeletal:        General: No swelling, tenderness, deformity or signs of injury.     Right lower leg: No edema.     Left lower leg: No edema.  Lymphadenopathy:     Cervical: No cervical adenopathy.     Upper Body:     Right upper body: No supraclavicular, axillary or pectoral adenopathy.     Left upper body: No supraclavicular, axillary or pectoral adenopathy.  Skin:    General: Skin is warm and dry.     Capillary Refill: Capillary refill takes less than 2 seconds.     Coloration: Skin is not jaundiced or pale.     Findings: No bruising, erythema, lesion or rash.  Neurological:     General: No focal deficit present.     Mental Status: She is alert and oriented to person, place, and time. Mental status is at baseline.     Cranial Nerves: No cranial nerve deficit.     Sensory: No sensory deficit.     Motor: No weakness.     Coordination: Coordination  normal.     Gait: Gait normal.     Deep Tendon Reflexes: Reflexes normal.  Psychiatric:        Mood and Affect: Mood normal.        Behavior: Behavior normal.        Thought Content: Thought content normal.        Judgment: Judgment normal.     Results for orders placed or performed in visit on 07/02/17  HM MAMMOGRAPHY  Result Value Ref Range   HM Mammogram 0-4 Bi-Rad 0-4 Bi-Rad, Self Reported Normal      Assessment & Plan:   Problem List Items Addressed This Visit      Cardiovascular and Mediastinum   Migraine    Stable. Still having issues around her period. Well controlled on imitrex. Refills given today. Call with any concerns.       Relevant Medications   DULoxetine (CYMBALTA) 60 MG capsule   SUMAtriptan  (IMITREX) 50 MG tablet     Other   Depression    Doing well on current regimen. Continue current regimens. Refills given today.      Relevant Medications   DULoxetine (CYMBALTA) 60 MG capsule    Other Visit Diagnoses    Routine general medical examination at a health care facility    -  Primary   Vaccines up to date. Screening labs checked today. Mammogram every 2- up to date. Pap up to date. Continue diet and exercise. Call with any concerns.    Relevant Orders   CBC with Differential/Platelet   Comprehensive metabolic panel   Lipid Panel w/o Chol/HDL Ratio   TSH   UA/M w/rflx Culture, Routine   Needs flu shot       Flu shot given today.   Relevant Orders   Flu Vaccine QUAD 6+ mos PF IM (Fluarix Quad PF) (Completed)       Follow up plan: Return in about 1 year (around 08/03/2019) for Physical.   LABORATORY TESTING:  - Pap smear: up to date  IMMUNIZATIONS:   - Tdap: Tetanus vaccination status reviewed: last tetanus booster within 10 years. - Influenza: Administered today - Pneumovax: Not applicable  SCREENING: -Mammogram: Up to date   PATIENT COUNSELING:   Advised to take 1 mg of folate supplement per day if capable of pregnancy.   Sexuality: Discussed sexually transmitted diseases, partner selection, use of condoms, avoidance of unintended pregnancy  and contraceptive alternatives.   Advised to avoid cigarette smoking.  I discussed with the patient that most people either abstain from alcohol or drink within safe limits (<=14/week and <=4 drinks/occasion for males, <=7/weeks and <= 3 drinks/occasion for females) and that the risk for alcohol disorders and other health effects rises proportionally with the number of drinks per week and how often a drinker exceeds daily limits.  Discussed cessation/primary prevention of drug use and availability of treatment for abuse.   Diet: Encouraged to adjust caloric intake to maintain  or achieve ideal body weight, to reduce  intake of dietary saturated fat and total fat, to limit sodium intake by avoiding high sodium foods and not adding table salt, and to maintain adequate dietary potassium and calcium preferably from fresh fruits, vegetables, and low-fat dairy products.    stressed the importance of regular exercise  Injury prevention: Discussed safety belts, safety helmets, smoke detector, smoking near bedding or upholstery.   Dental health: Discussed importance of regular tooth brushing, flossing, and dental visits.    NEXT PREVENTATIVE PHYSICAL DUE IN  1 YEAR. Return in about 1 year (around 08/03/2019) for Physical.

## 2018-08-02 NOTE — Assessment & Plan Note (Signed)
Stable. Still having issues around her period. Well controlled on imitrex. Refills given today. Call with any concerns.

## 2018-08-02 NOTE — Patient Instructions (Signed)

## 2018-08-02 NOTE — Assessment & Plan Note (Signed)
Doing well on current regimen. Continue current regimens. Refills given today.

## 2018-08-03 ENCOUNTER — Encounter: Payer: Self-pay | Admitting: Family Medicine

## 2018-08-03 DIAGNOSIS — E785 Hyperlipidemia, unspecified: Secondary | ICD-10-CM | POA: Insufficient documentation

## 2018-08-03 LAB — CBC WITH DIFFERENTIAL/PLATELET
BASOS: 1 %
Basophils Absolute: 0.1 10*3/uL (ref 0.0–0.2)
EOS (ABSOLUTE): 0.4 10*3/uL (ref 0.0–0.4)
Eos: 4 %
HEMOGLOBIN: 12.3 g/dL (ref 11.1–15.9)
Hematocrit: 35.4 % (ref 34.0–46.6)
IMMATURE GRANS (ABS): 0.1 10*3/uL (ref 0.0–0.1)
Immature Granulocytes: 1 %
LYMPHS: 21 %
Lymphocytes Absolute: 2 10*3/uL (ref 0.7–3.1)
MCH: 30.5 pg (ref 26.6–33.0)
MCHC: 34.7 g/dL (ref 31.5–35.7)
MCV: 88 fL (ref 79–97)
Monocytes Absolute: 0.9 10*3/uL (ref 0.1–0.9)
Monocytes: 9 %
NEUTROS ABS: 6.1 10*3/uL (ref 1.4–7.0)
Neutrophils: 64 %
PLATELETS: 323 10*3/uL (ref 150–450)
RBC: 4.03 x10E6/uL (ref 3.77–5.28)
RDW: 12.5 % (ref 11.7–15.4)
WBC: 9.5 10*3/uL (ref 3.4–10.8)

## 2018-08-03 LAB — MICROSCOPIC EXAMINATION: RBC, UA: NONE SEEN /hpf (ref 0–2)

## 2018-08-03 LAB — COMPREHENSIVE METABOLIC PANEL
A/G RATIO: 1.7 (ref 1.2–2.2)
ALBUMIN: 4.7 g/dL (ref 3.8–4.8)
ALT: 15 IU/L (ref 0–32)
AST: 17 IU/L (ref 0–40)
Alkaline Phosphatase: 83 IU/L (ref 39–117)
BUN / CREAT RATIO: 19 (ref 9–23)
BUN: 13 mg/dL (ref 6–24)
CHLORIDE: 97 mmol/L (ref 96–106)
CO2: 23 mmol/L (ref 20–29)
Calcium: 9.7 mg/dL (ref 8.7–10.2)
Creatinine, Ser: 0.69 mg/dL (ref 0.57–1.00)
GFR calc non Af Amer: 108 mL/min/{1.73_m2} (ref >59)
GFR, EST AFRICAN AMERICAN: 124 mL/min/{1.73_m2} (ref >59)
GLOBULIN, TOTAL: 2.7 g/dL (ref 1.5–4.5)
Glucose: 89 mg/dL (ref 65–99)
POTASSIUM: 4.1 mmol/L (ref 3.5–5.2)
SODIUM: 137 mmol/L (ref 134–144)
TOTAL PROTEIN: 7.4 g/dL (ref 6.0–8.5)

## 2018-08-03 LAB — LIPID PANEL W/O CHOL/HDL RATIO
Cholesterol, Total: 233 mg/dL — ABNORMAL HIGH (ref 100–199)
HDL: 72 mg/dL (ref >39)
LDL Calculated: 122 mg/dL — ABNORMAL HIGH (ref 0–99)
Triglycerides: 193 mg/dL — ABNORMAL HIGH (ref 0–149)
VLDL Cholesterol Cal: 39 mg/dL (ref 5–40)

## 2018-08-03 LAB — UA/M W/RFLX CULTURE, ROUTINE
Bilirubin, UA: NEGATIVE
GLUCOSE, UA: NEGATIVE
Ketones, UA: NEGATIVE
Nitrite, UA: NEGATIVE
Protein, UA: NEGATIVE
RBC, UA: NEGATIVE
Specific Gravity, UA: 1.025 (ref 1.005–1.030)
Urobilinogen, Ur: 0.2 mg/dL (ref 0.2–1.0)
pH, UA: 6.5 (ref 5.0–7.5)

## 2018-08-03 LAB — TSH: TSH: 2.56 u[IU]/mL (ref 0.450–4.500)

## 2018-09-06 ENCOUNTER — Ambulatory Visit (INDEPENDENT_AMBULATORY_CARE_PROVIDER_SITE_OTHER): Payer: 59 | Admitting: Podiatry

## 2018-09-06 ENCOUNTER — Ambulatory Visit (INDEPENDENT_AMBULATORY_CARE_PROVIDER_SITE_OTHER): Payer: 59

## 2018-09-06 ENCOUNTER — Other Ambulatory Visit: Payer: Self-pay

## 2018-09-06 ENCOUNTER — Encounter: Payer: Self-pay | Admitting: Podiatry

## 2018-09-06 DIAGNOSIS — M722 Plantar fascial fibromatosis: Secondary | ICD-10-CM

## 2018-09-06 MED ORDER — METHYLPREDNISOLONE 4 MG PO TBPK: ORAL_TABLET | ORAL | 0 refills | Status: DC

## 2018-09-06 MED ORDER — MELOXICAM 15 MG PO TABS: 15.0000 mg | ORAL_TABLET | Freq: Every day | ORAL | 1 refills | Status: DC

## 2018-09-08 NOTE — Progress Notes (Signed)
   Subjective: 43 year old female presenting today with a chief complaint of pain to the plantar aspect of the right heel that began 1-2 months ago. She states the pain is constant and worsens when she is on her feet for prolonged periods of time. She has not done anything for treatment. Patient is here for further evaluation and treatment.   Past Medical History:  Diagnosis Date  . Depression   . Dermatitis 2012  . Eczema   . Perioral dermatitis      Objective: Physical Exam General: The patient is alert and oriented x3 in no acute distress.  Dermatology: Skin is warm, dry and supple bilateral lower extremities. Negative for open lesions or macerations bilateral.   Vascular: Dorsalis Pedis and Posterior Tibial pulses palpable bilateral.  Capillary fill time is immediate to all digits.  Neurological: Epicritic and protective threshold intact bilateral.   Musculoskeletal: Tenderness to palpation to the plantar aspect of the right heel along the plantar fascia. All other joints range of motion within normal limits bilateral. Strength 5/5 in all groups bilateral.   Radiographic exam: Normal osseous mineralization. Joint spaces preserved. No fracture/dislocation/boney destruction. No other soft tissue abnormalities or radiopaque foreign bodies.   Assessment: 1. Plantar fasciitis right   Plan of Care:  1. Patient evaluated. Xrays reviewed.   2. Injection of 0.5cc Celestone soluspan injected into the right plantar fascia  3. Rx for Medrol Dose Pack placed 4. Rx for Meloxicam ordered for patient. 5. Instructed patient regarding therapies and modalities at home to alleviate symptoms.  6. Recommended OTC insoles from [employer].  7. Return to clinic in 4 weeks.    [employer] school [employer], broadcasting/film/video at [employer].    Felecia Shelling, DPM [facility]  Dr. Felecia Shelling, DPM    [address] [place], [place] [address]                 Office [phone]  Fax [phone]

## 2018-10-04 ENCOUNTER — Ambulatory Visit: Payer: 59 | Admitting: Podiatry

## 2018-10-17 ENCOUNTER — Telehealth: Payer: Self-pay | Admitting: Nurse Practitioner

## 2018-10-17 ENCOUNTER — Encounter: Payer: Self-pay | Admitting: Family Medicine

## 2018-10-17 ENCOUNTER — Other Ambulatory Visit: Payer: Self-pay

## 2018-10-17 ENCOUNTER — Ambulatory Visit (INDEPENDENT_AMBULATORY_CARE_PROVIDER_SITE_OTHER): Payer: 59 | Admitting: Family Medicine

## 2018-10-17 VITALS — Temp 98.6°F | Wt 180.0 lb

## 2018-10-17 DIAGNOSIS — G2581 Restless legs syndrome: Secondary | ICD-10-CM

## 2018-10-17 MED ORDER — ROPINIROLE HCL 0.25 MG PO TABS: 0.2500 mg | ORAL_TABLET | Freq: Every day | ORAL | 1 refills | Status: DC

## 2018-10-17 NOTE — Assessment & Plan Note (Signed)
New. Has never spoken about it to doctor before. Will check labs to look for organic cause. Patient having a lot of issues with sleep. Will start requip 0.25mg  QHS and recheck 1-2 weeks to see how she's doing. Call with any concerns.

## 2018-10-17 NOTE — Telephone Encounter (Signed)
Patient called and left a message stating she is having horrible restless leg and is unable to sleep. She is requesting something be called in for her. Please advise

## 2018-10-17 NOTE — Progress Notes (Signed)
Temp 98.6 F (37 C)   Wt 180 lb (81.6 kg)   BMI 29.50 kg/m    Subjective:    Patient ID: Tammy Stout, female    DOB: 09-17-1975, 43 y.o.   MRN: [id number]  HPI: Tammy Stout is a 43 y.o. female  Chief Complaint  Patient presents with  . restless leg    X 1 week   RESTLESS LEGS- has been having issues with her legs feeling like they are kicking and jerking for the last  Duration: 1 week Discomfort description:  Urge to kick Pain: no Location: thighs Bilateral: yes Symmetric: yes Severity: not painful Onset:  sudden- has had issues in the past, has gotten significantly worse Frequency:  At night Symptoms only occur while legs at rest: yes Sudden unintentional leg jerking: yes Bed partner bothered by leg movements: no LE numbness: no Decreased sensation: no Weakness: no Insomnia: yes Daytime somnolence: no Fatigue: yes Status: worse Treatments attempted: Hylan's RLS pills- hasn't gotten them this time.   Relevant past medical, surgical, family and social history reviewed and updated as indicated. Interim medical history since our last visit reviewed. Allergies and medications reviewed and updated.  Review of Systems  Constitutional: Negative.   Respiratory: Negative.   Cardiovascular: Negative.   Musculoskeletal: Positive for myalgias. Negative for arthralgias, back pain, gait problem, joint swelling, neck pain and neck stiffness.  Skin: Negative.   Psychiatric/Behavioral: Negative.     Per HPI unless specifically indicated above     Objective:    Temp 98.6 F (37 C)   Wt 180 lb (81.6 kg)   BMI 29.50 kg/m   Wt Readings from Last 3 Encounters:  10/17/18 180 lb (81.6 kg)  08/02/18 196 lb (88.9 kg)  07/01/18 195 lb (88.5 kg)    Physical Exam Vitals signs and nursing note reviewed.  Constitutional:      General: She is not in acute distress.    Appearance: Normal appearance. She is not ill-appearing, toxic-appearing or diaphoretic.  HENT:     Head: Normocephalic and atraumatic.     Right Ear: External ear normal.     Left Ear: External ear normal.     Nose: Nose normal.     Mouth/Throat:     Mouth: Mucous membranes are moist.     Pharynx: Oropharynx is clear.  Eyes:     General: No scleral icterus.       Right eye: No discharge.        Left eye: No discharge.     Conjunctiva/sclera: Conjunctivae normal.     Pupils: Pupils are equal, round, and reactive to light.  Neck:     Musculoskeletal: Normal range of motion.  Pulmonary:     Effort: Pulmonary effort is normal. No respiratory distress.     Comments: Speaking in full sentences Musculoskeletal: Normal range of motion.  Skin:    Coloration: Skin is not jaundiced or pale.     Findings: No bruising, erythema, lesion or rash.  Neurological:     Mental Status: She is alert and oriented to person, place, and time. Mental status is at baseline.  Psychiatric:        Mood and Affect: Mood normal.        Behavior: Behavior normal.        Thought Content: Thought content normal.        Judgment: Judgment normal.     Results for orders placed or performed in visit on 08/02/18  Microscopic  Examination  Result Value Ref Range   WBC, UA 0-5 0 - 5 /hpf   RBC, UA None seen 0 - 2 /hpf   Epithelial Cells (non renal) 0-10 0 - 10 /hpf   Bacteria, UA Few (A) None seen/Few  CBC with Differential/Platelet  Result Value Ref Range   WBC 9.5 3.4 - 10.8 x10E3/uL   RBC 4.03 3.77 - 5.28 x10E6/uL   Hemoglobin 12.3 11.1 - 15.9 g/dL   Hematocrit 16.1 09.6 - 46.6 %   MCV 88 79 - 97 fL   MCH 30.5 26.6 - 33.0 pg   MCHC 34.7 31.5 - 35.7 g/dL   RDW 04.5 40.9 - 81.1 %   Platelets 323 150 - 450 x10E3/uL   Neutrophils 64 Not Estab. %   Lymphs 21 Not Estab. %   Monocytes 9 Not Estab. %   Eos 4 Not Estab. %   Basos 1 Not Estab. %   Neutrophils Absolute 6.1 1.4 - 7.0 x10E3/uL   Lymphocytes Absolute 2.0 0.7 - 3.1 x10E3/uL   Monocytes Absolute 0.9 0.1 - 0.9 x10E3/uL   EOS (ABSOLUTE) 0.4  0.0 - 0.4 x10E3/uL   Basophils Absolute 0.1 0.0 - 0.2 x10E3/uL   Immature Granulocytes 1 Not Estab. %   Immature Grans (Abs) 0.1 0.0 - 0.1 x10E3/uL  Comprehensive metabolic panel  Result Value Ref Range   Glucose 89 65 - 99 mg/dL   BUN 13 6 - 24 mg/dL   Creatinine, Ser 9.14 0.57 - 1.00 mg/dL   GFR calc non Af Amer 108 >59 mL/min/1.73   GFR calc Af Amer 124 >59 mL/min/1.73   BUN/Creatinine Ratio 19 9 - 23   Sodium 137 134 - 144 mmol/L   Potassium 4.1 3.5 - 5.2 mmol/L   Chloride 97 96 - 106 mmol/L   CO2 23 20 - 29 mmol/L   Calcium 9.7 8.7 - 10.2 mg/dL   Total Protein 7.4 6.0 - 8.5 g/dL   Albumin 4.7 3.8 - 4.8 g/dL   Globulin, Total 2.7 1.5 - 4.5 g/dL   Albumin/Globulin Ratio 1.7 1.2 - 2.2   Bilirubin Total <0.2 0.0 - 1.2 mg/dL   Alkaline Phosphatase 83 39 - 117 IU/L   AST 17 0 - 40 IU/L   ALT 15 0 - 32 IU/L  Lipid Panel w/o Chol/HDL Ratio  Result Value Ref Range   Cholesterol, Total 233 (H) 100 - 199 mg/dL   Triglycerides 782 (H) 0 - 149 mg/dL   HDL 72 >95 mg/dL   VLDL Cholesterol Cal 39 5 - 40 mg/dL   LDL Calculated 621 (H) 0 - 99 mg/dL  TSH  Result Value Ref Range   TSH 2.560 0.450 - 4.500 uIU/mL  UA/M w/rflx Culture, Routine  Result Value Ref Range   Specific Gravity, UA 1.025 1.005 - 1.030   pH, UA 6.5 5.0 - 7.5   Color, UA Yellow Yellow   Appearance Ur Clear Clear   Leukocytes, UA Trace (A) Negative   Protein, UA Negative Negative/Trace   Glucose, UA Negative Negative   Ketones, UA Negative Negative   RBC, UA Negative Negative   Bilirubin, UA Negative Negative   Urobilinogen, Ur 0.2 0.2 - 1.0 mg/dL   Nitrite, UA Negative Negative   Microscopic Examination See below:       Assessment & Plan:   Problem List Items Addressed This Visit      Other   Restless leg - Primary    New. Has never spoken about it to  doctor before. Will check labs to look for organic cause. Patient having a lot of issues with sleep. Will start requip 0.25mg  QHS and recheck 1-2 weeks to  see how she's doing. Call with any concerns.       Relevant Orders   Comprehensive metabolic panel   CBC with Differential/Platelet   Ferritin   TSH   VITAMIN D 25 Hydroxy (Vit-D Deficiency, Fractures)   Bayer DCA Hb A1c Waived   Lyme Ab/Western Blot Reflex   Ehrlichia Antibody Panel   Rocky mtn spotted fvr abs pnl(IgG+IgM)   Babesia microti Antibody Panel       Follow up plan: Return 1-2 weeks, for follow up RLS.    . This visit was completed via Skype due to the restrictions of the COVID-19 pandemic. All issues as above were discussed and addressed. Physical exam was done as above through visual confirmation on Skype. If it was felt that the patient should be evaluated in the office, they were directed there. The patient verbally consented to this visit. . Location of the patient: home . Location of the provider: work . Those involved with this call:  . Provider: Olevia Perches, DO . CMA: Tiffany Reel, CMA . Front Desk/Registration: Adela Ports  . Time spent on call: 15 minutes with patient face to face via video conference. More than 50% of this time was spent in counseling and coordination of care. 23 minutes total spent in review of patient's record and preparation of their chart.

## 2018-10-17 NOTE — Telephone Encounter (Signed)
Appt made

## 2018-10-17 NOTE — Telephone Encounter (Signed)
She would need virtual visit for this issue as is currently on no medications for this.  She may be able to see one of the other providers this week or me next week.

## 2018-11-08 ENCOUNTER — Encounter: Payer: Self-pay | Admitting: Emergency Medicine

## 2018-11-08 ENCOUNTER — Ambulatory Visit
Admission: EM | Admit: 2018-11-08 | Discharge: 2018-11-08 | Disposition: A | Discharge status: Home / Self Care | Payer: 59 | Attending: Urgent Care | Admitting: Urgent Care

## 2018-11-08 ENCOUNTER — Other Ambulatory Visit: Payer: Self-pay

## 2018-11-08 ENCOUNTER — Ambulatory Visit (INDEPENDENT_AMBULATORY_CARE_PROVIDER_SITE_OTHER): Payer: 59

## 2018-11-08 DIAGNOSIS — M779 Enthesopathy, unspecified: Secondary | ICD-10-CM

## 2018-11-08 DIAGNOSIS — M778 Other enthesopathies, not elsewhere classified: Secondary | ICD-10-CM

## 2018-11-08 MED ORDER — PREDNISONE 20 MG PO TABS: 40.0000 mg | ORAL_TABLET | Freq: Every day | ORAL | 0 refills | Status: DC

## 2018-11-08 MED ORDER — IBUPROFEN 800 MG PO TABS: 800.0000 mg | ORAL_TABLET | Freq: Three times a day (TID) | ORAL | 0 refills | Status: DC

## 2018-11-08 NOTE — ED Provider Notes (Signed)
[address] [place], [place] [address] [phone]   Name: Tammy Stout DOB: December 13, 1975 MRN: [id number] CSN: [id number] PCP: Marjie Skiff, NP  Arrival date and time:  11/08/18 0802  Chief Complaint:  Hand Injury (right)  NOTE: Prior to seeing the patient today, I have reviewed the triage nursing documentation and vital signs. Clinical staff has updated patient's PMH/PSHx, current medication list, and drug allergies/intolerances to ensure comprehensive history available to assist in medical decision making.   History:   HPI: Tammy Stout is a 43 y.o. female who presents today with complaints of RIGHT hand pain since Saturday (11/06/2018). Patient notes that she has been doing some remodeling of her home. Over the weekend she used a scraper to remove old paint. Since this, she has had pain to the base of the 4th and 5th digits on her RIGHT hand (volar surface). Patient describes numbness and tingling to the associated fingers. She has had some some numbness and tingling in her wrist as well that has been going on for a longer duration. Patient denies previous injury/surgery to her hand/wrist. Patient notes difficulties making a fist. She has been taking IBU without perceived relief.   Past Medical History:  Diagnosis Date  . Depression   . Dermatitis 2012  . Eczema   . Perioral dermatitis     Past Surgical History:  Procedure Laterality Date  . CESAREAN SECTION  05/2006   SECTION DUE TO NO DIALATIONA AND CORD WAS AROUND BABY NECK    Family History  Problem Relation Age of Onset  . Cancer Paternal Grandmother 85       kidney cancer  . Cancer Maternal Grandmother 80       lung cancer  . Diabetes Maternal Grandmother   . Diabetes Mother   . Cancer Mother 74       One breast  . Thyroid disease Father   . Hyperlipidemia Father   . Diabetes Maternal Grandfather   . Thyroid disease Maternal Aunt     Social History   Socioeconomic History  .  Marital status: Married    Spouse name: Not on file  . Number of children: Not on file  . Years of education: Not on file  . Highest education level: Not on file  Occupational History  . Not on file  Social Needs  . Financial resource strain: Not on file  . Food insecurity:    Worry: Not on file    Inability: Not on file  . Transportation needs:    Medical: Not on file    Non-medical: Not on file  Tobacco Use  . Smoking status: Never Smoker  . Smokeless tobacco: Never Used  Substance and Sexual Activity  . Alcohol use: Yes    Comment: social  . Drug use: No  . Sexual activity: Yes  Lifestyle  . Physical activity:    Days per week: Not on file    Minutes per session: Not on file  . Stress: Not on file  Relationships  . Social connections:    Talks on phone: Not on file    Gets together: Not on file    Attends religious service: Not on file    Active member of club or organization: Not on file    Attends meetings of clubs or organizations: Not on file    Relationship status: Not on file  . Intimate partner violence:    Fear of current or ex partner: Not on file  Emotionally abused: Not on file    Physically abused: Not on file    Forced sexual activity: Not on file  Other Topics Concern  . Not on file  Social History Narrative  . Not on file    Patient Active Problem List   Diagnosis Date Noted  . Restless leg 10/17/2018  . Hyperlipidemia 08/03/2018  . Abnormal mammogram of right breast 06/05/2016  . Migraine 05/29/2016  . Depression   . Eczema   . Perioral dermatitis   . Contraception 09/27/2012  . Family history of breast cancer in female 04/08/2011    Home Medications:    Current Meds  Medication Sig  . cetirizine (ZYRTEC) 10 MG tablet Take 10 mg by mouth daily.  . DULoxetine (CYMBALTA) 60 MG capsule TAKE ONE (1) CAPSULE EACH DAY.  Marland Kitchen. rOPINIRole (REQUIP) 0.25 MG tablet Take 1 tablet (0.25 mg total) by mouth at bedtime.  . SUMAtriptan (IMITREX) 50  MG tablet Take 1 tablet (50 mg total) by mouth every 2 (two) hours as needed for migraine. May repeat in 2 hours if headache persists or recurs.    Allergies:   Patient has no known allergies.  Review of Systems (ROS): Review of Systems  Constitutional: Negative for chills and fever.  Respiratory: Negative for cough and shortness of breath.   Cardiovascular: Negative for chest pain and palpitations.  Musculoskeletal:       RIGHT hand pain  Neurological: Positive for numbness (RIGHT hand).     Physical Exam:  Triage Vital Signs ED Triage Vitals  Enc Vitals Group     BP 11/08/18 0814 128/70     Pulse Rate 11/08/18 0814 93     Resp 11/08/18 0814 14     Temp 11/08/18 0814 99.3 F (37.4 C)     Temp Source 11/08/18 0814 Oral     SpO2 11/08/18 0814 98 %     Weight 11/08/18 0811 180 lb (81.6 kg)     Height 11/08/18 0811 5\' 6"  (1.676 m)     Head Circumference --      Peak Flow --      Pain Score 11/08/18 0811 5     Pain Loc --      Pain Edu? --      Excl. in GC? --     Physical Exam  Constitutional: She is oriented to person, place, and time and well-developed, well-nourished, and in no distress.  HENT:  Head: Normocephalic and atraumatic.  Mouth/Throat: Mucous membranes are normal.  Cardiovascular: Normal rate, regular rhythm, normal heart sounds and intact distal pulses. Exam reveals no gallop and no friction rub.  No murmur heard. Pulmonary/Chest: Effort normal and breath sounds normal. No respiratory distress. She has no wheezes. She has no rales.  Musculoskeletal:     Right hand: She exhibits tenderness and swelling. She exhibits normal range of motion, normal two-point discrimination, normal capillary refill and no deformity. Normal sensation noted. Normal strength noted.       Hands:  Neurological: She is alert and oriented to person, place, and time. She has normal motor skills, normal sensation and normal reflexes.  Skin: Skin is warm and dry. No rash noted. No  erythema.  Psychiatric: Mood, affect and judgment normal.  Nursing note and vitals reviewed.    Urgent Care Treatments / Results:   LABS: PLEASE NOTE: all labs that were ordered this encounter are listed, however only abnormal results are displayed. Labs Reviewed - No data to display  EKG: -None  RADIOLOGY: Dg Hand Complete Right  Result Date: 11/08/2018 CLINICAL DATA:  Fourth and fifth digit pain following overuse, initial encounter EXAM: RIGHT HAND - COMPLETE 3+ VIEW COMPARISON:  None. FINDINGS: There is no evidence of fracture or dislocation. There is no evidence of arthropathy or other focal bone abnormality. Soft tissues are unremarkable. IMPRESSION: No acute abnormality noted. Electronically Signed   By: Alcide Clever M.D.   On: 11/08/2018 08:58    PRODEDURES: Procedures  MEDICATIONS RECEIVED THIS VISIT: Medications - No data to display  PERTINENT CLINICAL COURSE NOTES/UPDATES: No data to display   Initial Impression / Assessment and Plan / Urgent Care Course:    Tammy Stout is a 43 y.o. female who presents to [facility] today with complaints of Hand Injury (right)  Pertinent labs & imaging results that were available during my care of the patient were personally reviewed by me and considered in my medical decision making (see lab/imaging section of note for values and interpretations).  Exam reassuring and directly pointing to tendonitis. Patient adamant about having plain films done citing that she has had stress fractures in the past. Patient states, "I would feel better if we did the xray because I am worried about it". Diagnostic plain films negative for any acute osseous abnormalities. Again, symptoms and exam felt to be related to repetitive motion tendonitis. Will treat with anti-inflammatories (IBU) and short systemic steroid burst (prednisone). Patient encouraged to ice wrist/hand to help with pain.   Discussed follow up with primary care physician in  1 week for re-evaluation. I have reviewed the follow up and strict return precautions for any new or worsening symptoms. Patient is aware of symptoms that would be deemed urgent/emergent, and would thus require further evaluation either here or in the emergency department. At the time of discharge, she verbalized understanding and consent with the discharge plan as it was reviewed with her. All questions were fielded by provider and/or clinic staff prior to patient discharge.    Final Clinical Impressions(s) / Urgent Care Diagnoses:   Final diagnoses:  Tendonitis of right hand    New Prescriptions:   Meds ordered this encounter  Medications  . ibuprofen (ADVIL) 800 MG tablet    Sig: Take 1 tablet (800 mg total) by mouth 3 (three) times daily.    Dispense:  15 tablet    Refill:  0  . predniSONE (DELTASONE) 20 MG tablet    Sig: Take 2 tablets (40 mg total) by mouth daily.    Dispense:  6 tablet    Refill:  0    Controlled Substance Prescriptions:  [place] Controlled Substance Registry consulted? Not Applicable  NOTE: This note was prepared using Dragon dictation software along with smaller phrase technology. Despite my best ability to proofread, there is the potential that transcriptional errors may still occur from this process, and are completely unintentional.     Verlee Monte, NP 11/08/18 1010

## 2018-11-08 NOTE — Discharge Instructions (Addendum)
It was very nice meeting you today in clinic. Thank you for entrusting me with your care.   As discussed, your xray was normal. Your symptoms are consistent with tendonitis in your hand. Will treat with short steroid course and antu-inflammatory medication.   Please utilize the medications that we discussed. Your prescriptions have been called in to your pharmacy.   Make arrangements to follow up with your regular doctor in 1 week for re-evaluation. If your symptoms/condition worsens, please seek follow up care either here or in the ER. Please remember, our [facility] providers are "right here with you" when you need [place].   Again, it was my pleasure to take care of you today. Thank you for choosing our clinic. I hope that you start to feel better quickly.   Quentin Mulling, MSN, APRN, FNP-C, CEN Advanced Practice Provider [facility]

## 2018-11-08 NOTE — ED Triage Notes (Signed)
Patient has been scraping paint off her neck over the weekend and c/o pain and numbness in her right hand and tips of her fingers.

## 2018-11-10 ENCOUNTER — Other Ambulatory Visit: Payer: Self-pay

## 2018-11-10 ENCOUNTER — Other Ambulatory Visit: Payer: 59

## 2018-11-10 DIAGNOSIS — G2581 Restless legs syndrome: Secondary | ICD-10-CM | POA: Diagnosis not present

## 2018-11-10 LAB — BAYER DCA HB A1C WAIVED: HB A1C (BAYER DCA - WAIVED): 5.4 % (ref <7.0)

## 2018-11-12 LAB — CBC WITH DIFFERENTIAL/PLATELET
Basophils Absolute: 0 10*3/uL (ref 0.0–0.2)
Basos: 0 %
EOS (ABSOLUTE): 0 10*3/uL (ref 0.0–0.4)
Eos: 0 %
Hematocrit: 35.1 % (ref 34.0–46.6)
Hemoglobin: 11.9 g/dL (ref 11.1–15.9)
Immature Grans (Abs): 0.1 10*3/uL (ref 0.0–0.1)
Immature Granulocytes: 1 %
Lymphocytes Absolute: 1.1 10*3/uL (ref 0.7–3.1)
Lymphs: 13 %
MCH: 30.1 pg (ref 26.6–33.0)
MCHC: 33.9 g/dL (ref 31.5–35.7)
MCV: 89 fL (ref 79–97)
Monocytes Absolute: 0.5 10*3/uL (ref 0.1–0.9)
Monocytes: 6 %
Neutrophils Absolute: 6.9 10*3/uL (ref 1.4–7.0)
Neutrophils: 80 %
Platelets: 331 10*3/uL (ref 150–450)
RBC: 3.96 x10E6/uL (ref 3.77–5.28)
RDW: 12.8 % (ref 11.7–15.4)
WBC: 8.5 10*3/uL (ref 3.4–10.8)

## 2018-11-12 LAB — COMPREHENSIVE METABOLIC PANEL
ALT: 13 IU/L (ref 0–32)
AST: 12 IU/L (ref 0–40)
Albumin/Globulin Ratio: 1.8 (ref 1.2–2.2)
Albumin: 4.4 g/dL (ref 3.8–4.8)
Alkaline Phosphatase: 60 IU/L (ref 39–117)
BUN/Creatinine Ratio: 18 (ref 9–23)
BUN: 13 mg/dL (ref 6–24)
Bilirubin Total: <0.2 mg/dL (ref 0.0–1.2)
CO2: 21 mmol/L (ref 20–29)
Calcium: 9.2 mg/dL (ref 8.7–10.2)
Chloride: 104 mmol/L (ref 96–106)
Creatinine, Ser: 0.73 mg/dL (ref 0.57–1.00)
GFR calc Af Amer: 117 mL/min/{1.73_m2} (ref >59)
GFR calc non Af Amer: 102 mL/min/{1.73_m2} (ref >59)
Globulin, Total: 2.4 g/dL (ref 1.5–4.5)
Glucose: 121 mg/dL — ABNORMAL HIGH (ref 65–99)
Potassium: 4 mmol/L (ref 3.5–5.2)
Sodium: 141 mmol/L (ref 134–144)
Total Protein: 6.8 g/dL (ref 6.0–8.5)

## 2018-11-12 LAB — EHRLICHIA ANTIBODY PANEL
E. Chaffeensis (HME) IgM Titer: NEGATIVE
E.Chaffeensis (HME) IgG: NEGATIVE
HGE IgG Titer: NEGATIVE
HGE IgM Titer: NEGATIVE

## 2018-11-12 LAB — BABESIA MICROTI ANTIBODY PANEL
Babesia microti IgG: <1:10 {titer}
Babesia microti IgM: <1:10 {titer}

## 2018-11-12 LAB — ROCKY MTN SPOTTED FVR ABS PNL(IGG+IGM)
RMSF IgG: NEGATIVE
RMSF IgM: 0.55 index (ref 0.00–0.89)

## 2018-11-12 LAB — VITAMIN D 25 HYDROXY (VIT D DEFICIENCY, FRACTURES): Vit D, 25-Hydroxy: 30.6 ng/mL (ref 30.0–100.0)

## 2018-11-12 LAB — LYME AB/WESTERN BLOT REFLEX
LYME DISEASE AB, QUANT, IGM: <0.8 index (ref 0.00–0.79)
Lyme IgG/IgM Ab: <0.91 {ISR} (ref 0.00–0.90)

## 2018-11-12 LAB — FERRITIN: Ferritin: 29 ng/mL (ref 15–150)

## 2018-11-12 LAB — TSH: TSH: 1.58 u[IU]/mL (ref 0.450–4.500)

## 2018-11-18 ENCOUNTER — Encounter: Payer: Self-pay | Admitting: Nurse Practitioner

## 2018-11-18 ENCOUNTER — Ambulatory Visit (INDEPENDENT_AMBULATORY_CARE_PROVIDER_SITE_OTHER): Payer: 59 | Admitting: Nurse Practitioner

## 2018-11-18 ENCOUNTER — Other Ambulatory Visit: Payer: Self-pay

## 2018-11-18 DIAGNOSIS — G2581 Restless legs syndrome: Secondary | ICD-10-CM | POA: Diagnosis not present

## 2018-11-18 NOTE — Patient Instructions (Signed)
Ropinirole tablets  What is this medicine?  ROPINIROLE (roe PIN i role) is used to treat the symptoms of Parkinson's disease. It helps to improve muscle control and movement difficulties. It is also used for the treatment of Restless Legs Syndrome.  This medicine may be used for other purposes; ask your health care provider or pharmacist if you have questions.  COMMON BRAND NAME(S): Requip  What should I tell my health care provider before I take this medicine?  They need to know if you have any of these conditions:  -dizzy or fainting spells  -heart disease  -high blood pressure  -kidney disease  -liver disease  -low blood pressure  -sleeping problems  -an unusual or allergic reaction to ropinirole, other medicines, foods, dyes, or preservatives  -pregnant or trying to get pregnant  -breast-feeding  How should I use this medicine?  Take this medicine by mouth with a glass of water. Follow the directions on the prescription label. You can take it with or without food. If it upsets your stomach, take it with food. Take your doses at regular intervals. Do not take your medicine more often than directed. Do not stop taking this medicine except on your doctor's advice. Stopping this medicine too quickly may cause serious side effects.  Talk to your pediatrician regarding the use of this medicine in children. Special care may be needed.  Overdosage: If you think you have taken too much of this medicine contact a poison control center or emergency room at once.  NOTE: This medicine is only for you. Do not share this medicine with others.  What if I miss a dose?  If you miss a dose, take it as soon as you can. If it is almost time for your next dose, take only that dose. Do not take double or extra doses.  What may interact with this medicine?  -ciprofloxacin  -female hormones, like estrogens and birth control pills  -medicines for depression, anxiety, or psychotic  disturbances  -metoclopramide  -mexiletine  -norfloxacin  -omeprazole  This list may not describe all possible interactions. Give your health care provider a list of all the medicines, herbs, non-prescription drugs, or dietary supplements you use. Also tell them if you smoke, drink alcohol, or use illegal drugs. Some items may interact with your medicine.  What should I watch for while using this medicine?  Visit your doctor or health care professional for regular checks on your progress. It may be several weeks or months before you feel the full effect of this medicine.  You may get drowsy or dizzy. Do not drive, use machinery, or do anything that needs mental alertness until you know how this drug affects you. Do not stand or sit up quickly, especially if you are an older patient. This reduces the risk of dizzy or fainting spells. Alcohol can increase possible dizziness. Avoid alcoholic drinks. If you find that you have sudden feelings of wanting to sleep during normal activities, like cooking, watching television, or while driving or riding in a car, you should contact your health care professional.  Your mouth may get dry. Chewing sugarless gum or sucking hard candy, and drinking plenty of water may help. Contact your doctor if the problem does not go away or is severe.  There have been reports of increased sexual urges or other strong urges such as gambling while taking some medicines for Parkinson's disease. If you experience any of these urges while taking this medicine, you should report it   to your health care provider as soon as possible.  You should check your skin often for changes to moles and new growths while taking this medicine. Call your doctor if you notice any of these changes.  What side effects may I notice from receiving this medicine?  Side effects that you should report to your doctor or health care professional as soon as possible:  -allergic reactions like skin rash, itching or hives,  swelling of the face, lips, or tongue  -changes in vision  -chest pain  -confusion  -falling asleep during normal activities like driving  -fast, irregular heartbeat  -feeling faint or lightheaded, falls  -hallucination, loss of contact with reality  -joint or muscle pain  -loss of bladder control  -loss of memory  -new or increased gambling urges, sexual urges, uncontrolled spending, binge or compulsive eating, or other urges  -pain, tingling, numbness in the hands or feet  -shortness of breath, troubled breathing, tightness in chest, or wheezing  -signs and symptoms of low blood pressure like dizziness; feeling faint or lightheaded, falls; unusually weak or tired  -swelling of the ankles, feet, hands  -uncontrollable head, mouth, neck, arm, or leg movements  -vomiting  Side effects that usually do not require medical attention (report to your doctor or health care professional if they continue or are bothersome):  -dizziness  -drowsiness  -headache  -increased sweating  -nausea  -tremors  This list may not describe all possible side effects. Call your doctor for medical advice about side effects. You may report side effects to FDA at 1-800-FDA-1088.  Where should I keep my medicine?  Keep out of the reach of children.  Store at room temperature between 20 and 25 degrees C (68 and 77 degrees F). Protect from light and moisture. Keep container tightly closed. Throw away any unused medicine after the expiration date.  NOTE: This sheet is a summary. It may not cover all possible information. If you have questions about this medicine, talk to your doctor, pharmacist, or health care provider.   2019 Elsevier/Gold Standard (2015-11-25 10:58:05)

## 2018-11-18 NOTE — Assessment & Plan Note (Addendum)
Chronic, improved.  Continue current medication regimen and adjust as needed based on patient symptoms.  Return in 9 months for annual physical or sooner is worsening symptoms.

## 2018-11-18 NOTE — Progress Notes (Signed)
There were no vitals taken for this visit.   Subjective:    Patient ID: Tammy FlurryJoan E Clover, female    DOB: 1975-09-04, 43 y.o.   MRN: [id number]  HPI: Tammy Stout is a 43 y.o. female  Chief Complaint  Patient presents with  . RLS    . This visit was completed via telephone due to the restrictions of the COVID-19 pandemic. All issues as above were discussed and addressed but no physical exam was performed. If it was felt that the patient should be evaluated in the office, they were directed there. The patient verbally consented to this visit. Patient was unable to complete an audio/visual visit due to Technical difficulties,Lack of internet. Due to the catastrophic nature of the COVID-19 pandemic, this visit was done through audio contact only. . Location of the patient: home . Location of the provider: home . Those involved with this call:  . Provider: Aura DialsJolene Cannady, DNP . CMA: Tiffany Reel, CMA . Front Desk/Registration: Harriet PhoJoliza Johnson  . Time spent on call: 15 minutes on the phone discussing health concerns. 5 minutes total spent in review of patient's record and preparation of their chart. I verified patient identity using two factors (patient name and date of birth). Patient consents verbally to being seen via telemedicine visit today.   RESTLESS LEGS Current medication includes Requip 0.25 MG at bedtime.  Was started on this at last visit.  Prior to this had tried OTC medications [place]. She reports no further pain at night with Requip.  States it had been in bilateral legs, but this is improved now.  She is satisfied with her current dose. Duration: months Symptoms only occur while legs at rest: no Sudden unintentional leg jerking: no Bed partner bothered by leg movements: no LE numbness: no Decreased sensation: no Weakness: no Insomnia: no Daytime somnolence: no Fatigue: no Alleviating factors: Requip Aggravating factors: unknown Status: better Treatments  attempted: [place] OTC and Requip  Relevant past medical, surgical, family and social history reviewed and updated as indicated. Interim medical history since our last visit reviewed. Allergies and medications reviewed and updated.  Review of Systems  Constitutional: Negative for activity change, appetite change, diaphoresis, fatigue and fever.  Respiratory: Negative for cough, chest tightness and shortness of breath.   Cardiovascular: Negative for chest pain, palpitations and leg swelling.  Gastrointestinal: Negative for abdominal distention, abdominal pain, constipation, diarrhea, nausea and vomiting.  Neurological: Negative for dizziness, syncope, weakness, light-headedness, numbness and headaches.  Psychiatric/Behavioral: Negative.     Per HPI unless specifically indicated above     Objective:    There were no vitals taken for this visit.  Wt Readings from Last 3 Encounters:  11/08/18 180 lb (81.6 kg)  10/17/18 180 lb (81.6 kg)  08/02/18 196 lb (88.9 kg)    Physical Exam  Unable to perform physical exam due to telephone visit only.  Results for orders placed or performed in visit on 11/10/18  Babesia microti Antibody Panel  Result Value Ref Range   Babesia microti IgM <1:10 Neg:<1:10   Babesia microti IgG <1:10 Neg:<1:10  Rocky mtn spotted fvr abs pnl(IgG+IgM)  Result Value Ref Range   RMSF IgG Negative Negative   RMSF IgM 0.55 0.00 - 0.89 index  Ehrlichia Antibody Panel  Result Value Ref Range   E.Chaffeensis (HME) IgG Negative Neg:<1:64   E. Chaffeensis (HME) IgM Titer Negative Neg:<1:20   HGE IgG Titer Negative Neg:<1:64   HGE IgM Titer Negative Neg:<1:20  Lyme Ab/Western Blot  Reflex  Result Value Ref Range   Lyme IgG/IgM Ab <0.91 0.00 - 0.90 ISR   LYME DISEASE AB, QUANT, IGM <0.80 0.00 - 0.79 index  Bayer DCA Hb A1c Waived  Result Value Ref Range   HB A1C (BAYER DCA - WAIVED) 5.4 <7.0 %  VITAMIN D 25 Hydroxy (Vit-D Deficiency, Fractures)  Result Value Ref  Range   Vit D, 25-Hydroxy 30.6 30.0 - 100.0 ng/mL  TSH  Result Value Ref Range   TSH 1.580 0.450 - 4.500 uIU/mL  Ferritin  Result Value Ref Range   Ferritin 29 15 - 150 ng/mL  CBC with Differential/Platelet  Result Value Ref Range   WBC 8.5 3.4 - 10.8 x10E3/uL   RBC 3.96 3.77 - 5.28 x10E6/uL   Hemoglobin 11.9 11.1 - 15.9 g/dL   Hematocrit 16.1 09.6 - 46.6 %   MCV 89 79 - 97 fL   MCH 30.1 26.6 - 33.0 pg   MCHC 33.9 31.5 - 35.7 g/dL   RDW 04.5 40.9 - 81.1 %   Platelets 331 150 - 450 x10E3/uL   Neutrophils 80 Not Estab. %   Lymphs 13 Not Estab. %   Monocytes 6 Not Estab. %   Eos 0 Not Estab. %   Basos 0 Not Estab. %   Neutrophils Absolute 6.9 1.4 - 7.0 x10E3/uL   Lymphocytes Absolute 1.1 0.7 - 3.1 x10E3/uL   Monocytes Absolute 0.5 0.1 - 0.9 x10E3/uL   EOS (ABSOLUTE) 0.0 0.0 - 0.4 x10E3/uL   Basophils Absolute 0.0 0.0 - 0.2 x10E3/uL   Immature Granulocytes 1 Not Estab. %   Immature Grans (Abs) 0.1 0.0 - 0.1 x10E3/uL  Comprehensive metabolic panel  Result Value Ref Range   Glucose 121 (H) 65 - 99 mg/dL   BUN 13 6 - 24 mg/dL   Creatinine, Ser 9.14 0.57 - 1.00 mg/dL   GFR calc non Af Amer 102 >59 mL/min/1.73   GFR calc Af Amer 117 >59 mL/min/1.73   BUN/Creatinine Ratio 18 9 - 23   Sodium 141 134 - 144 mmol/L   Potassium 4.0 3.5 - 5.2 mmol/L   Chloride 104 96 - 106 mmol/L   CO2 21 20 - 29 mmol/L   Calcium 9.2 8.7 - 10.2 mg/dL   Total Protein 6.8 6.0 - 8.5 g/dL   Albumin 4.4 3.8 - 4.8 g/dL   Globulin, Total 2.4 1.5 - 4.5 g/dL   Albumin/Globulin Ratio 1.8 1.2 - 2.2   Bilirubin Total <0.2 0.0 - 1.2 mg/dL   Alkaline Phosphatase 60 39 - 117 IU/L   AST 12 0 - 40 IU/L   ALT 13 0 - 32 IU/L      Assessment & Plan:   Problem List Items Addressed This Visit      Other   Restless leg    Chronic, improved.  Continue current medication regimen and adjust as needed based on patient symptoms.  Return in 9 months for annual physical or sooner is worsening symptoms.         I  discussed the assessment and treatment plan with the patient. The patient was provided an opportunity to ask questions and all were answered. The patient agreed with the plan and demonstrated an understanding of the instructions.   The patient was advised to call back or seek an in-person evaluation if the symptoms worsen or if the condition fails to improve as anticipated.   I provided 15 minutes of time during this encounter.  Follow up plan: Return in about  9 months (around 08/20/2019), or if symptoms worsen or fail to improve, for February 2021 annual physical.

## 2018-12-08 ENCOUNTER — Telehealth: Payer: Self-pay | Admitting: Nurse Practitioner

## 2018-12-08 DIAGNOSIS — M778 Other enthesopathies, not elsewhere classified: Secondary | ICD-10-CM

## 2018-12-08 NOTE — Telephone Encounter (Signed)
Patient requesting ortho referral for tendinitis in hand.  Referral placed.

## 2018-12-08 NOTE — Telephone Encounter (Signed)
Patient notified

## 2018-12-08 NOTE — Telephone Encounter (Signed)
Copied from [place] [phone]. Topic: Referral - Request for Referral >> Dec 08, 2018  1:39 PM Alanda Slim E wrote: Has patient seen PCP for this complaint? Yes  *If NO, is insurance requiring patient see PCP for this issue before PCP can refer them? Referral for which specialty: Orthopedist  Preferred provider/office:  Reason for referral: Tendinitis in hand

## 2019-01-04 ENCOUNTER — Other Ambulatory Visit: Payer: Self-pay | Admitting: Family Medicine

## 2019-01-06 ENCOUNTER — Encounter: Payer: Self-pay | Admitting: Podiatry

## 2019-01-06 ENCOUNTER — Other Ambulatory Visit: Payer: Self-pay

## 2019-01-06 ENCOUNTER — Ambulatory Visit (INDEPENDENT_AMBULATORY_CARE_PROVIDER_SITE_OTHER): Payer: 59 | Admitting: Podiatry

## 2019-01-06 VITALS — Temp 98.0°F

## 2019-01-06 DIAGNOSIS — M722 Plantar fascial fibromatosis: Secondary | ICD-10-CM | POA: Diagnosis not present

## 2019-01-06 MED ORDER — METHYLPREDNISOLONE 4 MG PO TBPK: ORAL_TABLET | ORAL | 0 refills | Status: DC

## 2019-01-06 MED ORDER — MELOXICAM 15 MG PO TABS: 15.0000 mg | ORAL_TABLET | Freq: Every day | ORAL | 1 refills | Status: DC

## 2019-01-09 NOTE — Progress Notes (Signed)
   Subjective: 43 year old female presenting today for follow up evaluation of plantar fasciitis of the right foot. She states the pain began again 1-1.5 months ago. She reports associated numbness to the dorsum of the foot. She states the pain worsens throughout the day while she is on it. She has been using the plantar fascial brace and taking Ibuprofen with some relief. Patient is here for further evaluation and treatment.   Past Medical History:  Diagnosis Date  . Depression   . Dermatitis 2012  . Eczema   . Perioral dermatitis   . Tendonitis    right hand     Objective: Physical Exam General: The patient is alert and oriented x3 in no acute distress.  Dermatology: Skin is warm, dry and supple bilateral lower extremities. Negative for open lesions or macerations bilateral.   Vascular: Dorsalis Pedis and Posterior Tibial pulses palpable bilateral.  Capillary fill time is immediate to all digits.  Neurological: Epicritic and protective threshold intact bilateral.   Musculoskeletal: Tenderness to palpation to the plantar aspect of the right heel along the plantar fascia. All other joints range of motion within normal limits bilateral. Strength 5/5 in all groups bilateral.   Assessment: 1. Plantar fasciitis right - recurrent    Plan of Care:  1. Patient evaluated.    2. Injection of 0.5cc Celestone soluspan injected into the right plantar fascia  3. Rx for Medrol Dose Pack placed 4. Rx for Meloxicam ordered for patient. 5. Instructed patient regarding therapies and modalities at home to alleviate symptoms.  6. Plantar fascial brace dispensed.   7. Return to clinic in 4 weeks.    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    Edrick Kins, DPM [facility]  Dr. Edrick Kins, DPM    [address], [place] [address]                Office [phone]  Fax [phone]

## 2019-01-31 ENCOUNTER — Ambulatory Visit: Payer: 59 | Admitting: Podiatry

## 2019-03-03 ENCOUNTER — Other Ambulatory Visit: Payer: Self-pay

## 2019-03-03 ENCOUNTER — Encounter: Payer: Self-pay | Admitting: Podiatry

## 2019-03-03 ENCOUNTER — Ambulatory Visit (INDEPENDENT_AMBULATORY_CARE_PROVIDER_SITE_OTHER): Payer: 59 | Admitting: Podiatry

## 2019-03-03 DIAGNOSIS — M722 Plantar fascial fibromatosis: Secondary | ICD-10-CM | POA: Diagnosis not present

## 2019-03-03 MED ORDER — METHYLPREDNISOLONE 4 MG PO TBPK: ORAL_TABLET | ORAL | 0 refills | Status: DC

## 2019-03-03 NOTE — Patient Instructions (Signed)
Pre-Operative Instructions  Congratulations, you have decided to take an important step towards improving your quality of life.  You can be assured that the doctors and staff at [facility] will be with you every step of the way.  Here are some important things you should know:  1. Plan to be at the surgery center/hospital at least 1 (one) hour prior to your scheduled time, unless otherwise directed by the surgical center/hospital staff.  You must have a responsible adult accompany you, remain during the surgery and drive you home.  Make sure you have directions to the surgical center/hospital to ensure you arrive on time. 2. If you are having surgery at [facility] or [facility], you will need a copy of your medical history and physical form from your family physician within one month prior to the date of surgery. We will give you a form for your primary physician to complete.  3. We make every effort to accommodate the date you request for surgery.  However, there are times where surgery dates or times have to be moved.  We will contact you as soon as possible if a change in schedule is required.   4. No aspirin/ibuprofen for one week before surgery.  If you are on aspirin, any non-steroidal anti-inflammatory medications (Mobic, Aleve, Ibuprofen) should not be taken seven (7) days prior to your surgery.  You make take Tylenol for pain prior to surgery.  5. Medications - If you are taking daily heart and blood pressure medications, seizure, reflux, allergy, asthma, anxiety, pain or diabetes medications, make sure you notify the surgery center/hospital before the day of surgery so they can tell you which medications you should take or avoid the day of surgery. 6. No food or drink after midnight the night before surgery unless directed otherwise by surgical center/hospital staff. 7. No alcoholic beverages 24-hours prior to surgery.  No smoking 24-hours prior or 24-hours after  surgery. 8. Wear loose pants or shorts. They should be loose enough to fit over bandages, boots, and casts. 9. Don't wear slip-on shoes. Sneakers are preferred. 10. Bring your boot with you to the surgery center/hospital.  Also bring crutches or a walker if your physician has prescribed it for you.  If you do not have this equipment, it will be provided for you after surgery. 11. If you have not been contacted by the surgery center/hospital by the day before your surgery, call to confirm the date and time of your surgery. 12. Leave-time from work may vary depending on the type of surgery you have.  Appropriate arrangements should be made prior to surgery with your employer. 13. Prescriptions will be provided immediately following surgery by your doctor.  Fill these as soon as possible after surgery and take the medication as directed. Pain medications will not be refilled on weekends and must be approved by the doctor. 14. Remove nail polish on the operative foot and avoid getting pedicures prior to surgery. 15. Wash the night before surgery.  The night before surgery wash the foot and leg well with water and the antibacterial soap provided. Be sure to pay special attention to beneath the toenails and in between the toes.  Wash for at least three (3) minutes. Rinse thoroughly with water and dry well with a towel.  Perform this wash unless told not to do so by your physician.  Enclosed: 1 Ice pack (please put in freezer the night before surgery)   1 Hibiclens skin cleaner     Pre-op instructions  If you have any questions regarding the instructions, please do not hesitate to call our office.  [place]: [address], , [place] [address] -- [phone]  [place]: [address] [place], [place] [address] -- [phone]  [place]: [address]  [place], [place] [address] -- [phone]   Website: [email or web address] 

## 2019-03-05 NOTE — Progress Notes (Signed)
   Subjective: 43 year old female presenting today for follow up evaluation of plantar fasciitis of the right foot. 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 Being on her feet for long periods of time makes her pain worse. She has been taking Meloxicam for treatment. Patient is here for further evaluation and treatment.   Past Medical History:  Diagnosis Date  . Depression   . Dermatitis 2012  . Eczema   . Perioral dermatitis   . Tendonitis    right hand     Objective: Physical Exam General: The patient is alert and oriented x3 in no acute distress.  Dermatology: Skin is warm, dry and supple bilateral lower extremities. Negative for open lesions or macerations bilateral.   Vascular: Dorsalis Pedis and Posterior Tibial pulses palpable bilateral.  Capillary fill time is immediate to all digits.  Neurological: Epicritic and protective threshold intact bilateral.   Musculoskeletal: Tenderness to palpation to the plantar aspect of the right heel along the plantar fascia. All other joints range of motion within normal limits bilateral. Strength 5/5 in all groups bilateral.   Assessment: 1. Plantar fasciitis right    Plan of Care:  1. Patient evaluated.    2. Injection of 0.5cc Celestone soluspan injected into the right plantar fascia  3. Rx for Medrol Dose Pack placed. Then continue taking Meloxicam.  4. Today we discussed the conservative versus surgical management of the presenting pathology. The patient opts for surgical management. All possible complications and details of the procedure were explained. All patient questions were answered. No guarantees were expressed or implied. 5. Authorization for surgery was initiated today. Surgery will consist of EPF right.  6. Return to clinic one week post op.   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    Edrick Kins, DPM [facility]  Dr.  Edrick Kins, DPM    [address], [place] [address]                Office [phone]  Fax [phone]

## 2019-03-09 ENCOUNTER — Telehealth: Payer: Self-pay | Admitting: *Deleted

## 2019-03-09 NOTE — Telephone Encounter (Signed)
"  I see a Dr at the [facility].  I'd like to schedule my surgery for December 11 or the 14. If you would, call me back so we can set that up.  That would be great."

## 2019-03-10 NOTE — Telephone Encounter (Signed)
I am returning your call.  You want to schedule your surgery with Dr. Amalia Hailey?  "Yes, I do."  Do you have a date that you like?  "I'd like to do it on a Monday or a Friday, maybe, December 14."  Dr. Amalia Hailey only does surgeries on Thursdays.  "Oh, I guess Thursday then.  I can do it around the second week of December."  Dr. Amalia Hailey can do it on 06/01/2019.  "That date will be fine."  Someone from the surgical center will call you a day or two prior to your surgery date and will give you your arrival time.  You need to register online through the surgical center's [facility] Portal, instructions are in the brochure that we gave you.

## 2019-05-15 ENCOUNTER — Other Ambulatory Visit: Payer: Self-pay | Admitting: Podiatry

## 2019-05-15 ENCOUNTER — Telehealth: Payer: Self-pay | Admitting: *Deleted

## 2019-05-15 NOTE — Telephone Encounter (Signed)
"  I am scheduled for surgery in December.  I can't remember the date.  I think I'm scheduled for surgery on December 10 but I'm not sure."  Yes, you are scheduled for June 01, 2019.  You should get a call from someone from the surgical center a day or two prior to your surgery date and that person will give you your arrival time.

## 2019-05-17 ENCOUNTER — Telehealth: Payer: Self-pay | Admitting: *Deleted

## 2019-05-17 NOTE — Telephone Encounter (Signed)
DOS 06/01/2019 ENDOSCOPIC PLANTAR FASCIOTOMY RIGHT FOOT - 50354  [employer]: Eligibility Date - 06/22/2018 - 06/22/2019  Member's plan does not have an In-Network individual plan deductible Family, In-Network  Plan Deductible Per Calendar Year $3,000.00 of $3,000.00 Met   Out-of-Pocket Maximum Member's plan does not have an In-Network individual out-of-pocket maximum.  Out-of-Pocket Maximum Per Calendar Year Family  3,760.13 of $4,000.00 Met Remaining: $239.87  Copay $0.00 / visit  Co-Insurance 0%   Prior authorization is REQUIRED. Pending authorization number is [id number].

## 2019-05-22 NOTE — Telephone Encounter (Signed)
Notification/Prior Authorization Number [id number]   Covered/Approved    1-1 Code  Description  Coverage Status DECISION DATE    [facility]  Coverage determination is reflected for the facility admission and is not a guarantee of payment for ongoing services. Covered/Approved 05/22/2019  1 [id number] Endoscopic plantar fasciotomy  Covered/Approved 05/22/2019

## 2019-05-31 ENCOUNTER — Other Ambulatory Visit: Payer: Self-pay | Admitting: Family Medicine

## 2019-06-01 ENCOUNTER — Other Ambulatory Visit: Payer: Self-pay | Admitting: Podiatry

## 2019-06-01 DIAGNOSIS — M722 Plantar fascial fibromatosis: Secondary | ICD-10-CM | POA: Diagnosis not present

## 2019-06-01 MED ORDER — OXYCODONE-ACETAMINOPHEN 5-325 MG PO TABS: 1.0000 | ORAL_TABLET | Freq: Four times a day (QID) | ORAL | 0 refills | Status: DC | PRN

## 2019-06-01 NOTE — Progress Notes (Signed)
PRN postop 

## 2019-06-03 HISTORY — PX: FOOT TENDON SURGERY: SHX958

## 2019-06-09 ENCOUNTER — Encounter: Payer: Self-pay | Admitting: Podiatry

## 2019-06-09 ENCOUNTER — Ambulatory Visit (INDEPENDENT_AMBULATORY_CARE_PROVIDER_SITE_OTHER): Payer: 59 | Admitting: Podiatry

## 2019-06-09 ENCOUNTER — Other Ambulatory Visit: Payer: Self-pay

## 2019-06-09 DIAGNOSIS — M722 Plantar fascial fibromatosis: Secondary | ICD-10-CM

## 2019-06-09 DIAGNOSIS — Z9889 Other specified postprocedural states: Secondary | ICD-10-CM

## 2019-06-12 ENCOUNTER — Encounter: Payer: Self-pay | Admitting: Podiatry

## 2019-06-12 NOTE — Progress Notes (Signed)
   Subjective:  Patient presents today status post EPF right. DOS: 06/01/2019. She states she is doing well. He denies any significant pain or modifying factors. He has been using the CAM boot as directed. Patient is here for further evaluation and treatment.    Past Medical History:  Diagnosis Date  . Depression   . Dermatitis 2012  . Eczema   . Perioral dermatitis   . Tendonitis    right hand      Objective/Physical Exam Neurovascular status intact.  Skin incisions appear to be well coapted with sutures and staples intact. No sign of infectious process noted. No dehiscence. No active bleeding noted. Moderate edema noted to the surgical extremity.  Assessment: 1. s/p EPF right. DOS: 06/01/2019   Plan of Care:  1. Patient was evaluated.  2. Dressing changed.  3. Continue using CAM boot.  4. Post op shoe dispensed for PRN use.  5. Return to clinic in one week for suture removal.    Edrick Kins, DPM [facility]  Dr. Edrick Kins, [address]                                        [place], [place] [address]                Office [phone]  Fax [phone]

## 2019-06-13 ENCOUNTER — Ambulatory Visit (INDEPENDENT_AMBULATORY_CARE_PROVIDER_SITE_OTHER): Payer: 59 | Admitting: Podiatry

## 2019-06-13 ENCOUNTER — Other Ambulatory Visit: Payer: Self-pay

## 2019-06-13 ENCOUNTER — Encounter: Payer: Self-pay | Admitting: Podiatry

## 2019-06-13 DIAGNOSIS — M84375A Stress fracture, left foot, initial encounter for fracture: Secondary | ICD-10-CM

## 2019-06-13 DIAGNOSIS — M722 Plantar fascial fibromatosis: Secondary | ICD-10-CM

## 2019-06-20 NOTE — Progress Notes (Signed)
   Subjective:  Patient presents today status post EPF right. DOS: 06/01/2019. She states she is doing well. She denies any pain or modifying factors. She denies any concerns or complaints at this time. She has been using the CAM boot as directed. Patient is here for further evaluation and treatment.    Past Medical History:  Diagnosis Date  . Depression   . Dermatitis 2012  . Eczema   . Perioral dermatitis   . Tendonitis    right hand      Objective/Physical Exam Neurovascular status intact.  Skin incisions appear to be well coapted with sutures and staples intact. No sign of infectious process noted. No dehiscence. No active bleeding noted. Moderate edema noted to the surgical extremity.  Assessment: 1. s/p EPF right. DOS: 06/01/2019   Plan of Care:  1. Patient was evaluated.  2. Sutures removed.  3. Continue to use CAM boot for two weeks.  4. Return to clinic in 4 weeks for final follow up visit.     Edrick Kins, DPM [facility]  Dr. Edrick Kins, [address]                                        [place], [place] [address]                Office [phone]  Fax [phone]

## 2019-06-30 ENCOUNTER — Encounter: Payer: 59 | Admitting: Podiatry

## 2019-07-14 ENCOUNTER — Other Ambulatory Visit: Payer: Self-pay

## 2019-07-14 ENCOUNTER — Ambulatory Visit (INDEPENDENT_AMBULATORY_CARE_PROVIDER_SITE_OTHER): Payer: Managed Care, Other (non HMO) | Admitting: Podiatry

## 2019-07-14 DIAGNOSIS — M722 Plantar fascial fibromatosis: Secondary | ICD-10-CM

## 2019-07-14 DIAGNOSIS — Z9889 Other specified postprocedural states: Secondary | ICD-10-CM

## 2019-07-14 MED ORDER — MELOXICAM 15 MG PO TABS: ORAL_TABLET | ORAL | 1 refills | Status: DC

## 2019-07-17 NOTE — Progress Notes (Signed)
   Subjective:  Patient presents today status post EPF right. DOS: 06/01/2019. She states she is doing well but reports some intermittent pain in the heel. Walking for long periods of time increases the pain. She has been wearing regular shoes and has transitioned from the CAM boot as directed. Patient is here for further evaluation and treatment.    Past Medical History:  Diagnosis Date  . Depression   . Dermatitis 2012  . Eczema   . Perioral dermatitis   . Tendonitis    right hand      Objective/Physical Exam Neurovascular status intact.  Skin incisions appear to be well coapted. No sign of infectious process noted. No dehiscence. No active bleeding noted. Moderate edema noted to the surgical extremity.  Assessment: 1. s/p EPF right. DOS: 06/01/2019   Plan of Care:  1. Patient was evaluated.  2. Prescription for Meloxicam 15 mg provided to patient. 3. Recommended good shoe gear.  4. Return to clinic as needed.   Felecia Shelling, DPM [facility]  Dr. Felecia Shelling, DPM    [address]                                        [place], [place] [address]                Office [phone]  Fax [phone]

## 2019-08-07 ENCOUNTER — Encounter: Payer: 59 | Admitting: Nurse Practitioner

## 2019-10-03 ENCOUNTER — Other Ambulatory Visit: Payer: Self-pay

## 2019-10-03 ENCOUNTER — Encounter: Payer: Self-pay | Admitting: Nurse Practitioner

## 2019-10-03 ENCOUNTER — Ambulatory Visit (INDEPENDENT_AMBULATORY_CARE_PROVIDER_SITE_OTHER): Payer: Managed Care, Other (non HMO) | Admitting: Nurse Practitioner

## 2019-10-03 VITALS — BP 134/85 | HR 98 | Temp 98.5°F | Wt 197.1 lb

## 2019-10-03 DIAGNOSIS — G43829 Menstrual migraine, not intractable, without status migrainosus: Secondary | ICD-10-CM

## 2019-10-03 MED ORDER — METOPROLOL TARTRATE 25 MG PO TABS: 25.0000 mg | ORAL_TABLET | Freq: Two times a day (BID) | ORAL | 5 refills | Status: DC

## 2019-10-03 NOTE — Assessment & Plan Note (Signed)
Chronic, ongoing with some worsening and increased frequency of migraines.  HR 90-100 range today.  Discussed with patient maintenance options, will trial Metoprolol tartrate 25 MG orally BID, script sent.  Will adjust dose as needed.  Educated patient on medication and provided information for her to review.  Continue Imitrex as needed.  Return in 4 weeks for follow-up, sooner if worsening symptoms.  CMP and TSH today.

## 2019-10-03 NOTE — Progress Notes (Signed)
BP 134/85 (BP Location: Left Arm, Patient Position: Sitting, Cuff Size: Normal)   Pulse 98 Comment: apical  Temp 98.5 F (36.9 C) (Oral)   Wt 197 lb 2 oz (89.4 kg)   SpO2 96%   BMI 31.82 kg/m    Subjective:    Patient ID: Tammy Stout, female    DOB: 1976-04-13, 44 y.o.   MRN: [id number]  HPI: Tammy Stout is a 44 y.o. female  Chief Complaint  Patient presents with  . Migraine    Migraines have gotten worse since last seen. Specifically when laying down.   MIGRAINES Currently continues on Imitrex and Duloxetine, this is for mood and migraines.  She reports they have become somewhat worse (slowly since January), notices this more at night.  When lies down, starts in front of left side and goes around.  She reports at times taking Advil or Excedrin when this presents, which helps.  At times is waking up with migraines, this is every day. Also noticing these every night.  Initially migraines were associated with menstrual cycles, but now have become worse.  Denies snoring at HS. Duration: chronic Onset: gradual Severity: 2-7/10 Quality: dull and throbbing Frequency: intermittent Location: left side frontal and around to back of left side head Headache duration: sometimes all night, sometimes for "a little while"  Radiation: yes Time of day headache occurs: night Alleviating factors: Excedrin and Advil -- Imitrex helps more with daytime, not much with night time Aggravating factors: laying down Headache status at time of visit: asymptomatic Treatments attempted: Treatments attempted: aleve", excedrine and triptans   Aura: no Nausea:  yes Vomiting: no Photophobia:  yes Phonophobia:  yes Effect on social functioning:  yes Numbers of missed days of school/work each month: none Confusion:  no Gait disturbance/ataxia:  no Behavioral changes:  no Fevers:  no  Relevant past medical, surgical, family and social history reviewed and updated as indicated. Interim medical  history since our last visit reviewed. Allergies and medications reviewed and updated.  Review of Systems  Constitutional: Negative for activity change, appetite change, diaphoresis, fatigue and fever.  Respiratory: Negative for cough, chest tightness, shortness of breath and wheezing.   Cardiovascular: Negative for chest pain, palpitations and leg swelling.  Gastrointestinal: Negative.   Neurological: Positive for headaches. Negative for dizziness, tremors, facial asymmetry, speech difficulty, weakness and numbness.  Psychiatric/Behavioral: Negative.     Per HPI unless specifically indicated above     Objective:    BP 134/85 (BP Location: Left Arm, Patient Position: Sitting, Cuff Size: Normal)   Pulse 98 Comment: apical  Temp 98.5 F (36.9 C) (Oral)   Wt 197 lb 2 oz (89.4 kg)   SpO2 96%   BMI 31.82 kg/m   Wt Readings from Last 3 Encounters:  10/03/19 197 lb 2 oz (89.4 kg)  11/08/18 180 lb (81.6 kg)  10/17/18 180 lb (81.6 kg)    Physical Exam Vitals and nursing note reviewed.  Constitutional:      General: She is awake. She is not in acute distress.    Appearance: She is well-developed and well-groomed. She is obese. She is not ill-appearing.  HENT:     Head: Normocephalic.     Right Ear: Hearing normal.     Left Ear: Hearing normal.  Eyes:     General: Lids are normal.        Right eye: No discharge.        Left eye: No discharge.  Conjunctiva/sclera: Conjunctivae normal.     Pupils: Pupils are equal, round, and reactive to light.  Neck:     Thyroid: No thyromegaly.     Vascular: No carotid bruit.  Cardiovascular:     Rate and Rhythm: Normal rate and regular rhythm.     Heart sounds: Normal heart sounds. No murmur. No gallop.   Pulmonary:     Effort: Pulmonary effort is normal. No accessory muscle usage or respiratory distress.     Breath sounds: Normal breath sounds.  Abdominal:     General: Bowel sounds are normal.     Palpations: Abdomen is soft.    Musculoskeletal:     Cervical back: Normal range of motion and neck supple.     Right lower leg: No edema.     Left lower leg: No edema.  Skin:    General: Skin is warm and dry.  Neurological:     Mental Status: She is alert and oriented to person, place, and time.     Cranial Nerves: Cranial nerves are intact.     Motor: Motor function is intact.     Coordination: Coordination is intact.     Gait: Gait is intact.     Deep Tendon Reflexes: Reflexes are normal and symmetric.     Reflex Scores:      Brachioradialis reflexes are 2+ on the right side and 2+ on the left side.      Patellar reflexes are 2+ on the right side and 2+ on the left side. Psychiatric:        Attention and Perception: Attention normal.        Mood and Affect: Mood normal.        Speech: Speech normal.        Behavior: Behavior normal. Behavior is cooperative.        Thought Content: Thought content normal.     Results for orders placed or performed in visit on 11/10/18  Babesia microti Antibody Panel  Result Value Ref Range   Babesia microti IgM <1:10 Neg:<1:10   Babesia microti IgG <1:10 Neg:<1:10  Rocky mtn spotted fvr abs pnl(IgG+IgM)  Result Value Ref Range   RMSF IgG Negative Negative   RMSF IgM 0.55 0.00 - 0.89 index  Ehrlichia Antibody Panel  Result Value Ref Range   E.Chaffeensis (HME) IgG Negative Neg:<1:64   E. Chaffeensis (HME) IgM Titer Negative Neg:<1:20   HGE IgG Titer Negative Neg:<1:64   HGE IgM Titer Negative Neg:<1:20  Lyme Ab/Western Blot Reflex  Result Value Ref Range   Lyme IgG/IgM Ab <0.91 0.00 - 0.90 ISR   LYME DISEASE AB, QUANT, IGM <0.80 0.00 - 0.79 index  Bayer DCA Hb A1c Waived  Result Value Ref Range   HB A1C (BAYER DCA - WAIVED) 5.4 <7.0 %  VITAMIN D 25 Hydroxy (Vit-D Deficiency, Fractures)  Result Value Ref Range   Vit D, 25-Hydroxy 30.6 30.0 - 100.0 ng/mL  TSH  Result Value Ref Range   TSH 1.580 0.450 - 4.500 uIU/mL  Ferritin  Result Value Ref Range   Ferritin  29 15 - 150 ng/mL  CBC with Differential/Platelet  Result Value Ref Range   WBC 8.5 3.4 - 10.8 x10E3/uL   RBC 3.96 3.77 - 5.28 x10E6/uL   Hemoglobin 11.9 11.1 - 15.9 g/dL   Hematocrit 73.4 19.3 - 46.6 %   MCV 89 79 - 97 fL   MCH 30.1 26.6 - 33.0 pg   MCHC 33.9 31.5 - 35.7 g/dL  RDW 12.8 11.7 - 15.4 %   Platelets 331 150 - 450 x10E3/uL   Neutrophils 80 Not Estab. %   Lymphs 13 Not Estab. %   Monocytes 6 Not Estab. %   Eos 0 Not Estab. %   Basos 0 Not Estab. %   Neutrophils Absolute 6.9 1.4 - 7.0 x10E3/uL   Lymphocytes Absolute 1.1 0.7 - 3.1 x10E3/uL   Monocytes Absolute 0.5 0.1 - 0.9 x10E3/uL   EOS (ABSOLUTE) 0.0 0.0 - 0.4 x10E3/uL   Basophils Absolute 0.0 0.0 - 0.2 x10E3/uL   Immature Granulocytes 1 Not Estab. %   Immature Grans (Abs) 0.1 0.0 - 0.1 x10E3/uL  Comprehensive metabolic panel  Result Value Ref Range   Glucose 121 (H) 65 - 99 mg/dL   BUN 13 6 - 24 mg/dL   Creatinine, Ser 0.73 0.57 - 1.00 mg/dL   GFR calc non Af Amer 102 >59 mL/min/1.73   GFR calc Af Amer 117 >59 mL/min/1.73   BUN/Creatinine Ratio 18 9 - 23   Sodium 141 134 - 144 mmol/L   Potassium 4.0 3.5 - 5.2 mmol/L   Chloride 104 96 - 106 mmol/L   CO2 21 20 - 29 mmol/L   Calcium 9.2 8.7 - 10.2 mg/dL   Total Protein 6.8 6.0 - 8.5 g/dL   Albumin 4.4 3.8 - 4.8 g/dL   Globulin, Total 2.4 1.5 - 4.5 g/dL   Albumin/Globulin Ratio 1.8 1.2 - 2.2   Bilirubin Total <0.2 0.0 - 1.2 mg/dL   Alkaline Phosphatase 60 39 - 117 IU/L   AST 12 0 - 40 IU/L   ALT 13 0 - 32 IU/L      Assessment & Plan:   Problem List Items Addressed This Visit      Cardiovascular and Mediastinum   Migraine - Primary    Chronic, ongoing with some worsening and increased frequency of migraines.  HR 90-100 range today.  Discussed with patient maintenance options, will trial Metoprolol tartrate 25 MG orally BID, script sent.  Will adjust dose as needed.  Educated patient on medication and provided information for her to review.  Continue  Imitrex as needed.  Return in 4 weeks for follow-up, sooner if worsening symptoms.  CMP and TSH today.      Relevant Medications   metoprolol tartrate (LOPRESSOR) 25 MG tablet   Other Relevant Orders   TSH   Comprehensive metabolic panel       Follow up plan: Return in about 2 weeks (around 10/17/2019) for migraine.

## 2019-10-03 NOTE — Patient Instructions (Addendum)
Metoprolol Tablets What is this medicine? METOPROLOL (me TOE proe lole) is a beta blocker. It decreases the amount of work your heart has to do and helps your heart beat regularly. It is used to treat high blood pressure and/or prevent chest pain (also called angina). It is also used after a heart attack to prevent a second one. This medicine may be used for other purposes; ask your health care provider or pharmacist if you have questions. COMMON BRAND NAME(S): Lopressor What should I tell my health care provider before I take this medicine? They need to know if you have any of these conditions:  diabetes  heart or vessel disease like slow heart rate, worsening heart failure, heart block, sick sinus syndrome or Raynaud's disease  kidney disease  liver disease  lung or breathing disease, like asthma or emphysema  pheochromocytoma  thyroid disease  an unusual or allergic reaction to metoprolol, other beta-blockers, medicines, foods, dyes, or preservatives  pregnant or trying to get pregnant  breast-feeding How should I use this medicine? Take this drug by mouth with water. Take it as directed on the prescription label at the same time every day. You can take it with or without food. You should always take it the same way. Keep taking it unless your health care provider tells you to stop. Talk to your health care provider about the use of this drug in children. Special care may be needed. Overdosage: If you think you have taken too much of this medicine contact a poison control center or emergency room at once. NOTE: This medicine is only for you. Do not share this medicine with others. What if I miss a dose? If you miss a dose, take it as soon as you can. If it is almost time for your next dose, take only that dose. Do not take double or extra doses. What may interact with this medicine? This medicine may interact with the following medications:  certain medicines for blood  pressure, heart disease, irregular heart beat  certain medicines for depression like monoamine oxidase (MAO) inhibitors, fluoxetine, or paroxetine  clonidine  dobutamine  epinephrine  isoproterenol  reserpine This list may not describe all possible interactions. Give your health care provider a list of all the medicines, herbs, non-prescription drugs, or dietary supplements you use. Also tell them if you smoke, drink alcohol, or use illegal drugs. Some items may interact with your medicine. What should I watch for while using this medicine? Visit your doctor or health care professional for regular check ups. Contact your doctor right away if your symptoms worsen. Check your blood pressure and pulse rate regularly. Ask your health care professional what your blood pressure and pulse rate should be, and when you should contact them. You may get drowsy or dizzy. Do not drive, use machinery, or do anything that needs mental alertness until you know how this medicine affects you. Do not sit or stand up quickly, especially if you are an older patient. This reduces the risk of dizzy or fainting spells. Contact your doctor if these symptoms continue. Alcohol may interfere with the effect of this medicine. Avoid alcoholic drinks. This medicine may increase blood sugar. Ask your healthcare provider if changes in diet or medicines are needed if you have diabetes. What side effects may I notice from receiving this medicine? Side effects that you should report to your doctor or health care professional as soon as possible:  allergic reactions like skin rash, itching or hives  cold or numb hands or feet  depression  difficulty breathing  faint  fever with sore throat  irregular heartbeat, chest pain  rapid weight gain   signs and symptoms of high blood sugar such as being more thirsty or hungry or having to urinate more than normal. You may also feel very tired or have blurry vision.   swollen legs or ankles Side effects that usually do not require medical attention (report to your doctor or health care professional if they continue or are bothersome):  anxiety or nervousness  change in sex drive or performance  dry skin  headache  nightmares or trouble sleeping  short term memory loss  stomach upset or diarrhea This list may not describe all possible side effects. Call your doctor for medical advice about side effects. You may report side effects to FDA at 1-800-FDA-1088. Where should I keep my medicine? Keep out of the reach of children and pets. Store at room temperature between 15 and 30 degrees C (59 and 86 degrees F). Protect from moisture. Keep the container tightly closed. Throw away any unused drug after the expiration date. NOTE: This sheet is a summary. It may not cover all possible information. If you have questions about this medicine, talk to your doctor, pharmacist, or health care provider.  2020 Elsevier/Gold Standard (2019-01-19 17:21:17)  Migraine Headache A migraine headache is a very strong throbbing pain on one side or both sides of your head. This type of headache can also cause other symptoms. It can last from 4 hours to 3 days. Talk with your doctor about what things may bring on (trigger) this condition. What are the causes? The exact cause of this condition is not known. This condition may be triggered or caused by:  Drinking alcohol.  Smoking.  Taking medicines, such as: ? Medicine used to treat chest pain (nitroglycerin). ? Birth control pills. ? Estrogen. ? Some blood pressure medicines.  Eating or drinking certain products.  Doing physical activity. Other things that may trigger a migraine headache include:  Having a menstrual period.  Pregnancy.  Hunger.  Stress.  Not getting enough sleep or getting too much sleep.  Weather changes.  Tiredness (fatigue). What increases the risk?  Being 85-36 years old.   Being female.  Having a family history of migraine headaches.  Being Caucasian.  Having depression or anxiety.  Being very overweight. What are the signs or symptoms?  A throbbing pain. This pain may: ? Happen in any area of the head, such as on one side or both sides. ? Make it hard to do daily activities. ? Get worse with physical activity. ? Get worse around bright lights or loud noises.  Other symptoms may include: ? Feeling sick to your stomach (nauseous). ? Vomiting. ? Dizziness. ? Being sensitive to bright lights, loud noises, or smells.  Before you get a migraine headache, you may get warning signs (an aura). An aura may include: ? Seeing flashing lights or having blind spots. ? Seeing bright spots, halos, or zigzag lines. ? Having tunnel vision or blurred vision. ? Having numbness or a tingling feeling. ? Having trouble talking. ? Having weak muscles.  Some people have symptoms after a migraine headache (postdromal phase), such as: ? Tiredness. ? Trouble thinking (concentrating). How is this treated?  Taking medicines that: ? Relieve pain. ? Relieve the feeling of being sick to your stomach. ? Prevent migraine headaches.  Treatment may also include: ? Having acupuncture. ? Avoiding foods that  bring on migraine headaches. ? Learning ways to control your body functions (biofeedback). ? Therapy to help you know and deal with negative thoughts (cognitive behavioral therapy). Follow these instructions at home: Medicines  Take over-the-counter and prescription medicines only as told by your doctor.  Ask your doctor if the medicine prescribed to you: ? Requires you to avoid driving or using heavy machinery. ? Can cause trouble pooping (constipation). You may need to take these steps to prevent or treat trouble pooping:  Drink enough fluid to keep your pee (urine) pale yellow.  Take over-the-counter or prescription medicines.  Eat foods that are high in  fiber. These include beans, whole grains, and fresh fruits and vegetables.  Limit foods that are high in fat and sugar. These include fried or sweet foods. Lifestyle  Do not drink alcohol.  Do not use any products that contain nicotine or tobacco, such as cigarettes, e-cigarettes, and chewing tobacco. If you need help quitting, ask your doctor.  Get at least 8 hours of sleep every night.  Limit and deal with stress. General instructions      Keep a journal to find out what may bring on your migraine headaches. For example, write down: ? What you eat and drink. ? How much sleep you get. ? Any change in what you eat or drink. ? Any change in your medicines.  If you have a migraine headache: ? Avoid things that make your symptoms worse, such as bright lights. ? It may help to lie down in a dark, quiet room. ? Do not drive or use heavy machinery. ? Ask your doctor what activities are safe for you.  Keep all follow-up visits as told by your doctor. This is important. Contact a doctor if:  You get a migraine headache that is different or worse than others you have had.  You have more than 15 headache days in one month. Get help right away if:  Your migraine headache gets very bad.  Your migraine headache lasts longer than 72 hours.  You have a fever.  You have a stiff neck.  You have trouble seeing.  Your muscles feel weak or like you cannot control them.  You start to lose your balance a lot.  You start to have trouble walking.  You pass out (faint).  You have a seizure. Summary  A migraine headache is a very strong throbbing pain on one side or both sides of your head. These headaches can also cause other symptoms.  This condition may be treated with medicines and changes to your lifestyle.  Keep a journal to find out what may bring on your migraine headaches.  Contact a doctor if you get a migraine headache that is different or worse than others you have  had.  Contact your doctor if you have more than 15 headache days in a month. This information is not intended to replace advice given to you by your health care provider. Make sure you discuss any questions you have with your health care provider. Document Revised: 09/30/2018 Document Reviewed: 07/21/2018 Elsevier Patient Education  2020 [employer].

## 2019-10-04 LAB — COMPREHENSIVE METABOLIC PANEL
ALT: 15 IU/L (ref 0–32)
AST: 20 IU/L (ref 0–40)
Albumin/Globulin Ratio: 2 (ref 1.2–2.2)
Albumin: 4.5 g/dL (ref 3.8–4.8)
Alkaline Phosphatase: 85 IU/L (ref 39–117)
BUN/Creatinine Ratio: 13 (ref 9–23)
BUN: 15 mg/dL (ref 6–24)
Bilirubin Total: <0.2 mg/dL (ref 0.0–1.2)
CO2: 24 mmol/L (ref 20–29)
Calcium: 9.5 mg/dL (ref 8.7–10.2)
Chloride: 101 mmol/L (ref 96–106)
Creatinine, Ser: 1.16 mg/dL — ABNORMAL HIGH (ref 0.57–1.00)
GFR calc Af Amer: 67 mL/min/{1.73_m2} (ref >59)
GFR calc non Af Amer: 58 mL/min/{1.73_m2} — ABNORMAL LOW (ref >59)
Globulin, Total: 2.2 g/dL (ref 1.5–4.5)
Glucose: 90 mg/dL (ref 65–99)
Potassium: 3.8 mmol/L (ref 3.5–5.2)
Sodium: 143 mmol/L (ref 134–144)
Total Protein: 6.7 g/dL (ref 6.0–8.5)

## 2019-10-04 LAB — TSH: TSH: 1.96 u[IU]/mL (ref 0.450–4.500)

## 2019-10-04 NOTE — Progress Notes (Signed)
Contacted via MyChart Good evening Tammy Stout.  Your labs have returned. - thyroid is normal range - Liver function is normal. Kidney function there is slight elevation in creatinine and decrease in GFR from previous labs.  We will continue to monitor this to ensure it does not stay within this range, which would be considered mild kidney disease, these can fluctuate.  I recommend increasing your water intake during daytime hours and we will recheck next visit.    Overall no major concerns on labs.

## 2019-10-17 ENCOUNTER — Ambulatory Visit (INDEPENDENT_AMBULATORY_CARE_PROVIDER_SITE_OTHER): Payer: Managed Care, Other (non HMO) | Admitting: Nurse Practitioner

## 2019-10-17 ENCOUNTER — Encounter: Payer: Self-pay | Admitting: Nurse Practitioner

## 2019-10-17 VITALS — HR 90

## 2019-10-17 DIAGNOSIS — G43829 Menstrual migraine, not intractable, without status migrainosus: Secondary | ICD-10-CM | POA: Diagnosis not present

## 2019-10-17 MED ORDER — METOPROLOL TARTRATE 25 MG PO TABS: 25.0000 mg | ORAL_TABLET | Freq: Two times a day (BID) | ORAL | 4 refills | Status: DC

## 2019-10-17 NOTE — Progress Notes (Signed)
Pulse 90    Subjective:    Patient ID: Tammy Stout, female    DOB: 1976-05-28, 44 y.o.   MRN: [id number]  HPI: Tammy Stout is a 44 y.o. female  Chief Complaint  Patient presents with  . Migraine    2 week f/up    . This visit was completed via telephone due to the restrictions of the COVID-19 pandemic. All issues as above were discussed and addressed but no physical exam was performed. If it was felt that the patient should be evaluated in the office, they were directed there. The patient verbally consented to this visit. Patient was unable to complete an audio/visual visit due to Technical difficulties,Lack of internet. Due to the catastrophic nature of the COVID-19 pandemic, this visit was done through audio contact only. . Location of the patient: work . Location of the provider: work . Those involved with this call:  . Provider: Marnee Guarneri, DNP . CMA: Yvonna Alanis, CMA . Front Desk/Registration: Don Perking  . Time spent on call: 15 minutes on the phone discussing health concerns. 10 minutes total spent in review of patient's record and preparation of their chart.  . I verified patient identity using two factors (patient name and date of birth). Patient consents verbally to being seen via telemedicine visit today.    MIGRAINES Started on Metoprolol 25 MG BID last visit for migraine maintenance therapy.  She reports vast improvement with this, no further migraines and is sleeping better.  Reports she has not slept well for years and now is sleeping well.  Denies ADR and resting HR <100.  Also continues on Imitrex and Duloxetine, this is for mood and migraines.  At last visit she reported migraines had become somewhat worse (slowly since January), notices this more at night.  When lies down, starts in front of left side and goes around.  She reports at times taking Advil or Excedrin when this presents, which helps.  At times is waking up with migraines, this  is every day. Also noticing these every night.  Initially migraines were associated with menstrual cycles, but now have become worse.  Denies snoring at HS.  Now migraines have improved and no further migraines with Metoprolol on board. Duration: chronic Headache status at time of visit: asymptomatic Treatments attempted: Treatments attempted: aleve", excedrin and triptans   Aura: no Nausea:  yes Vomiting: no Photophobia:  yes Phonophobia:  yes Effect on social functioning:  yes Numbers of missed days of school/work each month: none Confusion:  no Gait disturbance/ataxia:  no Behavioral changes:  no Fevers:  no  Relevant past medical, surgical, family and social history reviewed and updated as indicated. Interim medical history since our last visit reviewed. Allergies and medications reviewed and updated.  Review of Systems  Constitutional: Negative for activity change, appetite change, diaphoresis, fatigue and fever.  Respiratory: Negative for cough, chest tightness, shortness of breath and wheezing.   Cardiovascular: Negative for chest pain, palpitations and leg swelling.  Gastrointestinal: Negative.   Neurological: Negative for dizziness, tremors, facial asymmetry, speech difficulty, weakness, numbness and headaches.  Psychiatric/Behavioral: Negative.     Per HPI unless specifically indicated above     Objective:    Pulse 90   Wt Readings from Last 3 Encounters:  10/03/19 197 lb 2 oz (89.4 kg)  11/08/18 180 lb (81.6 kg)  10/17/18 180 lb (81.6 kg)    Physical Exam   Unable to perform due to telephone visit only.  Results  for orders placed or performed in visit on 10/03/19  TSH  Result Value Ref Range   TSH 1.960 0.450 - 4.500 uIU/mL  Comprehensive metabolic panel  Result Value Ref Range   Glucose 90 65 - 99 mg/dL   BUN 15 6 - 24 mg/dL   Creatinine, Ser 1.69 (H) 0.57 - 1.00 mg/dL   GFR calc non Af Amer 58 (L) >59 mL/min/1.73   GFR calc Af Amer 67 >59 mL/min/1.73    BUN/Creatinine Ratio 13 9 - 23   Sodium 143 134 - 144 mmol/L   Potassium 3.8 3.5 - 5.2 mmol/L   Chloride 101 96 - 106 mmol/L   CO2 24 20 - 29 mmol/L   Calcium 9.5 8.7 - 10.2 mg/dL   Total Protein 6.7 6.0 - 8.5 g/dL   Albumin 4.5 3.8 - 4.8 g/dL   Globulin, Total 2.2 1.5 - 4.5 g/dL   Albumin/Globulin Ratio 2.0 1.2 - 2.2   Bilirubin Total <0.2 0.0 - 1.2 mg/dL   Alkaline Phosphatase 85 39 - 117 IU/L   AST 20 0 - 40 IU/L   ALT 15 0 - 32 IU/L      Assessment & Plan:   Problem List Items Addressed This Visit      Cardiovascular and Mediastinum   Migraine - Primary    Chronic and improved symptoms with Metoprolol for maintenance.  Continue Metoprolol, will send in 3 month supply + Imitrex for acute episodes only.  Recommend she continue to monitor HR and notify provider if levels <50 consistently.  Will adjust Metoprolol dose as needed based on symptoms.  Return in 6 months, sooner if any worsening symptoms.      Relevant Medications   metoprolol tartrate (LOPRESSOR) 25 MG tablet      I discussed the assessment and treatment plan with the patient. The patient was provided an opportunity to ask questions and all were answered. The patient agreed with the plan and demonstrated an understanding of the instructions.   The patient was advised to call back or seek an in-person evaluation if the symptoms worsen or if the condition fails to improve as anticipated.   I provided 15+ minutes of time during this encounter.  Follow up plan: Return in about 6 months (around 04/17/2020) for Annual physical .

## 2019-10-17 NOTE — Patient Instructions (Signed)

## 2019-10-17 NOTE — Assessment & Plan Note (Signed)
Chronic and improved symptoms with Metoprolol for maintenance.  Continue Metoprolol, will send in 3 month supply + Imitrex for acute episodes only.  Recommend she continue to monitor HR and notify provider if levels <50 consistently.  Will adjust Metoprolol dose as needed based on symptoms.  Return in 6 months, sooner if any worsening symptoms.

## 2019-10-18 ENCOUNTER — Telehealth: Payer: Self-pay | Admitting: Nurse Practitioner

## 2019-10-18 NOTE — Telephone Encounter (Signed)
-----   Message from Marjie Skiff, NP sent at 10/17/2019  3:04 PM EDT ----- 6 months -- see if she is okay with annual physical at this appointment as is due and if not then make a follow-up only.  Thanks

## 2019-10-18 NOTE — Telephone Encounter (Signed)
Lvm,with information below.

## 2019-10-23 NOTE — Telephone Encounter (Signed)
Lvm with information below 

## 2019-11-30 ENCOUNTER — Other Ambulatory Visit: Payer: Self-pay | Admitting: Nurse Practitioner

## 2019-11-30 NOTE — Telephone Encounter (Signed)
Requested Prescriptions  Pending Prescriptions Disp Refills  . DULoxetine (CYMBALTA) 60 MG capsule [Pharmacy Med Name: DULOXETINE HCL 60 MG CAP] 90 capsule 1    Sig: TAKE ONE (1) CAPSULE EACH DAY.     Psychiatry: Antidepressants - SNRI Passed - 11/30/2019  9:17 AM      Passed - Completed PHQ-2 or PHQ-9 in the last 360 days.      Passed - Last BP in normal range    BP Readings from Last 1 Encounters:  10/03/19 134/85         Passed - Valid encounter within last 6 months    Recent Outpatient Visits          1 month ago Menstrual migraine without status migrainosus, not intractable   [facility] [place], Jolene T, NP   1 month ago Menstrual migraine without status migrainosus, not intractable   [facility] [place], [place] T, NP   1 year ago Restless leg   [facility] [place], Corrie Dandy T, NP   1 year ago Restless leg   [facility] [place], Megan P, DO   1 year ago Routine general medical examination at a health care facility   [facility], Oralia Rud, DO      Future Appointments            In 5 months Cannady, Dorie Rank, NP [facility]

## 2019-12-26 ENCOUNTER — Ambulatory Visit (INDEPENDENT_AMBULATORY_CARE_PROVIDER_SITE_OTHER): Payer: Managed Care, Other (non HMO) | Admitting: Podiatry

## 2019-12-26 ENCOUNTER — Other Ambulatory Visit: Payer: Self-pay

## 2019-12-26 DIAGNOSIS — M722 Plantar fascial fibromatosis: Secondary | ICD-10-CM | POA: Diagnosis not present

## 2019-12-26 NOTE — Progress Notes (Signed)
   HPI: 44 y.o. female presenting today for evaluation of right foot pain.  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  DOS: 06/01/2019.  Patient states that she continued to have pain and tenderness ever since surgery.  She has not noticed any significant improvement despite multiple conservative modalities and eventually endoscopic plantar fasciotomy.  She states that the only relief she gets is by taking meloxicam daily.  If she does not take the meloxicam she immediately notices an increase of pain.  Past Medical History:  Diagnosis Date  . Depression   . Dermatitis 2012  . Eczema   . Perioral dermatitis   . Tendonitis    right hand     Physical Exam: General: The patient is alert and oriented x3 in no acute distress.  Dermatology: Skin is warm, dry and supple bilateral lower extremities. Negative for open lesions or macerations.  Vascular: Palpable pedal pulses bilaterally. No edema or erythema noted. Capillary refill within normal limits.  Neurological: Epicritic and protective threshold grossly intact bilaterally.   Musculoskeletal Exam: Range of motion within normal limits to all pedal and ankle joints bilateral. Muscle strength 5/5 in all groups bilateral.  There continues to be pain and tenderness to the right plantar heel at the insertion of the plantar fascia.  With dorsiflexion the plantar fascia is actually loose compared to the contralateral limb.  Assessment: 1.  Chronic right heel pain/plantar fasciitis right 2.  H/o EPF RT. DOS: 06/01/2019  Plan of Care:  1. Patient evaluated.  2.  Today were going to order an MRI right heel without contrast.  Patient has been dealing with this pain for greater than 6 months and there has been no improvement of symptoms despite surgical management and intervention. 3.  Continue meloxicam daily since this seems to be the only thing that provides any temporary relief 4.  Return to clinic in 3 weeks to review MRI results        Felecia Shelling, DPM [facility]  Dr. Felecia Shelling, DPM    [address] [place], [place] [address]                Office [phone]  Fax [phone]

## 2020-01-04 ENCOUNTER — Telehealth: Payer: Self-pay

## 2020-01-04 NOTE — Telephone Encounter (Signed)
-----   Message from Brent M Evans, DPM sent at 12/26/2019  2:02 PM EDT ----- °Regarding: MRI right heel without contrast °Please order MRI right heel without contrast ° °Diagnosis: Chronic plantar fasciitis right.  History of EPF right 06/01/2019. ° °

## 2020-01-04 NOTE — Telephone Encounter (Signed)
Office notes have been sent to [facility] for authorization on MRI

## 2020-01-12 ENCOUNTER — Telehealth: Payer: Self-pay

## 2020-01-12 NOTE — Telephone Encounter (Signed)
I spoke with [employer] to get approval for MRI, per [facility] R. Patient needs follow up xray documented before it could be approved.  She requested for a peer to peer, however Dr. Logan Bores is out of office for next week.  Patient is scheduled for follow up in 10 days and we will obtain xrays at that visit.  Patient has been notified of denial and reasoning.  She understands that we will get xrays at next visit.

## 2020-01-12 NOTE — Telephone Encounter (Signed)
-----   Message from Felecia Shelling, DPM sent at 12/26/2019  2:02 PM EDT ----- Regarding: MRI right heel without contrast Please order MRI right heel without contrast  Diagnosis: Chronic plantar fasciitis right.  History of EPF right 06/01/2019.

## 2020-01-23 ENCOUNTER — Encounter: Payer: Self-pay | Admitting: Podiatry

## 2020-01-23 ENCOUNTER — Ambulatory Visit (INDEPENDENT_AMBULATORY_CARE_PROVIDER_SITE_OTHER): Payer: Managed Care, Other (non HMO) | Admitting: Podiatry

## 2020-01-23 ENCOUNTER — Other Ambulatory Visit: Payer: Self-pay

## 2020-01-23 ENCOUNTER — Ambulatory Visit (INDEPENDENT_AMBULATORY_CARE_PROVIDER_SITE_OTHER): Payer: Managed Care, Other (non HMO)

## 2020-01-23 DIAGNOSIS — Z9889 Other specified postprocedural states: Secondary | ICD-10-CM

## 2020-01-23 DIAGNOSIS — M722 Plantar fascial fibromatosis: Secondary | ICD-10-CM

## 2020-01-23 NOTE — Progress Notes (Signed)
   HPI: 44 y.o. female presenting today for evaluation of right foot pain.  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  DOS: 06/01/2019.  Patient continues to have pain and tenderness to the right plantar heel.  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  Last visit on 12/26/2019 MRI was ordered however it was not approved since the patient does not have follow-up x-rays.  Today she presents to get x-rays and hopefully we can approve her MRI of her right heel to rule out any other pathology  Past Medical History:  Diagnosis Date  . Depression   . Dermatitis 2012  . Eczema   . Perioral dermatitis   . Tendonitis    right hand     Physical Exam: General: The patient is alert and oriented x3 in no acute distress.  Dermatology: Skin is warm, dry and supple bilateral lower extremities. Negative for open lesions or macerations.  Vascular: Palpable pedal pulses bilaterally. No edema or erythema noted. Capillary refill within normal limits.  Neurological: Epicritic and protective threshold grossly intact bilaterally.   Musculoskeletal Exam: Range of motion within normal limits to all pedal and ankle joints bilateral. Muscle strength 5/5 in all groups bilateral.  There continues to be pain and tenderness to the right plantar heel at the insertion of the plantar fascia.  With dorsiflexion the plantar fascia is actually loose compared to the contralateral limb.  Radiographic exam: No change since prior x-rays.  Cortices are intact.  No fracture identified.  Joint spaces are preserved.  Normal osseous mineralization.  Assessment: 1.  Chronic right heel pain/plantar fasciitis right 2.  H/o EPF RT. DOS: 06/01/2019  Plan of Care:  1. Patient evaluated.  2.  Again, the patient has failed conservative and surgical management and treatment of the patient's right heel pain.  She has tried anti-inflammatory injections, oral anti-inflammatories, immobilization in a  cam boot, and surgical EPF of the right foot.  It is now been 7 months and the patient has not improved since surgery.  Today we will resubmit a request for an MRI right heel to rule out any other soft tissue pathology 3.  Patient may discontinue meloxicam.  Patient has been chronically taking meloxicam for temporary relief however it is now upsetting her stomach and she is having GI symptoms 4.  Continue wearing good supportive shoes 5.  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 Felecia Shelling, DPM    [address] [place], [place] [address]                Office [phone]  Fax [phone]

## 2020-01-26 ENCOUNTER — Other Ambulatory Visit: Payer: Self-pay | Admitting: Family Medicine

## 2020-04-23 ENCOUNTER — Other Ambulatory Visit: Payer: Self-pay

## 2020-04-23 ENCOUNTER — Ambulatory Visit (INDEPENDENT_AMBULATORY_CARE_PROVIDER_SITE_OTHER): Payer: Managed Care, Other (non HMO) | Admitting: Podiatry

## 2020-04-23 DIAGNOSIS — Z9889 Other specified postprocedural states: Secondary | ICD-10-CM

## 2020-04-23 DIAGNOSIS — M722 Plantar fascial fibromatosis: Secondary | ICD-10-CM

## 2020-04-23 MED ORDER — METHYLPREDNISOLONE 4 MG PO TBPK: ORAL_TABLET | ORAL | 0 refills | Status: DC

## 2020-04-23 NOTE — Progress Notes (Addendum)
   HPI: 44 y.o. female presenting today for evaluation of right foot pain.  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  DOS: 06/01/2019.  Patient states that her pain is now worse and severe.  She is unable to put pressure on the foot without severe pain.  She did not hear anything back from the MRI since last visit and she states that she got busy but she presents now with severe pain and tenderness to the heel.  Past Medical History:  Diagnosis Date  . Depression   . Dermatitis 2012  . Eczema   . Perioral dermatitis   . Tendonitis    right hand     Physical Exam: General: The patient is alert and oriented x3 in no acute distress.  Dermatology: Skin is warm, dry and supple bilateral lower extremities. Negative for open lesions or macerations.  Vascular: Palpable pedal pulses bilaterally. No edema or erythema noted. Capillary refill within normal limits.  Neurological: Epicritic and protective threshold grossly intact bilaterally.   Musculoskeletal Exam: Range of motion within normal limits to all pedal and ankle joints bilateral. Muscle strength 5/5 in all groups bilateral.  There continues to be severe pain and tenderness to the right plantar heel at the insertion of the plantar fascia.  With dorsiflexion the plantar fascia is actually loose compared to the contralateral limb.  Assessment: 1.  Chronic right heel pain/plantar fasciitis right 2.  H/o EPF RT. DOS: 06/01/2019  Plan of Care:  1. Patient evaluated.  2.  Again, the patient has failed conservative and surgical management and treatment of the patient's right heel pain.  She has tried anti-inflammatory injections, oral anti-inflammatories, immobilization in a cam boot, and surgical EPF of the right foot.  It is now been 10 months and the patient has not improved since surgery.  Today we will resubmit a request for an MRI right heel to rule out any other soft tissue pathology 3.  Patient may discontinue meloxicam.  I did  send in prescription today for Medrol Dosepak.  Patient has been chronically taking meloxicam for temporary relief however it is now upsetting her stomach and she is having GI symptoms 4.  Continue wearing good supportive shoes.  OTC power step insoles were provided today. 5.  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 Felecia Shelling, DPM    [address] [place], [place] [address]                Office [phone]  Fax [phone]

## 2020-04-29 ENCOUNTER — Other Ambulatory Visit: Payer: 59

## 2020-05-03 ENCOUNTER — Other Ambulatory Visit: Payer: Self-pay

## 2020-05-03 ENCOUNTER — Ambulatory Visit (INDEPENDENT_AMBULATORY_CARE_PROVIDER_SITE_OTHER): Payer: Managed Care, Other (non HMO) | Admitting: Nurse Practitioner

## 2020-05-03 ENCOUNTER — Encounter: Payer: Self-pay | Admitting: Nurse Practitioner

## 2020-05-03 VITALS — BP 103/64 | HR 89 | Temp 98.3°F | Ht 65.0 in | Wt 201.2 lb

## 2020-05-03 DIAGNOSIS — Z23 Encounter for immunization: Secondary | ICD-10-CM | POA: Diagnosis not present

## 2020-05-03 DIAGNOSIS — G43829 Menstrual migraine, not intractable, without status migrainosus: Secondary | ICD-10-CM

## 2020-05-03 DIAGNOSIS — Z1329 Encounter for screening for other suspected endocrine disorder: Secondary | ICD-10-CM

## 2020-05-03 DIAGNOSIS — Z0000 Encounter for general adult medical examination without abnormal findings: Secondary | ICD-10-CM

## 2020-05-03 DIAGNOSIS — Z1159 Encounter for screening for other viral diseases: Secondary | ICD-10-CM

## 2020-05-03 DIAGNOSIS — E782 Mixed hyperlipidemia: Secondary | ICD-10-CM

## 2020-05-03 DIAGNOSIS — Z6833 Body mass index (BMI) 33.0-33.9, adult: Secondary | ICD-10-CM

## 2020-05-03 DIAGNOSIS — Z803 Family history of malignant neoplasm of breast: Secondary | ICD-10-CM

## 2020-05-03 DIAGNOSIS — Z1231 Encounter for screening mammogram for malignant neoplasm of breast: Secondary | ICD-10-CM

## 2020-05-03 DIAGNOSIS — E6609 Other obesity due to excess calories: Secondary | ICD-10-CM

## 2020-05-03 DIAGNOSIS — E669 Obesity, unspecified: Secondary | ICD-10-CM | POA: Insufficient documentation

## 2020-05-03 DIAGNOSIS — F325 Major depressive disorder, single episode, in full remission: Secondary | ICD-10-CM

## 2020-05-03 NOTE — Patient Instructions (Addendum)
IMAGING:  [phone]  Influenza (Flu) Vaccine (Inactivated or Recombinant): What You Need to Know 1. Why get vaccinated? Influenza vaccine can prevent influenza (flu). Flu is a contagious disease that spreads around the [place] every year, usually between October and May. Anyone can get the flu, but it is more dangerous for some people. Infants and young children, people 43 years of age and older, pregnant women, and people with certain health conditions or a weakened immune system are at greatest risk of flu complications. Pneumonia, bronchitis, sinus infections and ear infections are examples of flu-related complications. If you have a medical condition, such as heart disease, cancer or diabetes, flu can make it worse. Flu can cause fever and chills, sore throat, muscle aches, fatigue, cough, headache, and runny or stuffy nose. Some people may have vomiting and diarrhea, though this is more common in children than adults. Each year thousands of people in the [place] die from flu, and many more are hospitalized. Flu vaccine prevents millions of illnesses and flu-related visits to the doctor each year. 2. Influenza vaccine CDC recommends everyone 57 months of age and older get vaccinated every flu season. Children 6 months through 74 years of age may need 2 doses during a single flu season. Everyone else needs only 1 dose each flu season. It takes about 2 weeks for protection to develop after vaccination. There are many flu viruses, and they are always changing. Each year a new flu vaccine is made to protect against three or four viruses that are likely to cause disease in the upcoming flu season. Even when the vaccine doesn't exactly match these viruses, it may still provide some protection. Influenza vaccine does not cause flu. Influenza vaccine may be given at the same time as other vaccines. 3. Talk with your health care provider Tell your vaccine provider if the person getting the  vaccine:  Has had an allergic reaction after a previous dose of influenza vaccine, or has any severe, life-threatening allergies.  Has ever had Guillain-Barr Syndrome (also called GBS). In some cases, your health care provider may decide to postpone influenza vaccination to a future visit. People with minor illnesses, such as a cold, may be vaccinated. People who are moderately or severely ill should usually wait until they recover before getting influenza vaccine. Your health care provider can give you more information. 4. Risks of a vaccine reaction  Soreness, redness, and swelling where shot is given, fever, muscle aches, and headache can happen after influenza vaccine.  There may be a very small increased risk of Guillain-Barr Syndrome (GBS) after inactivated influenza vaccine (the flu shot). Young children who get the flu shot along with pneumococcal vaccine (PCV13), and/or DTaP vaccine at the same time might be slightly more likely to have a seizure caused by fever. Tell your health care provider if a child who is getting flu vaccine has ever had a seizure. People sometimes faint after medical procedures, including vaccination. Tell your provider if you feel dizzy or have vision changes or ringing in the ears. As with any medicine, there is a very remote chance of a vaccine causing a severe allergic reaction, other serious injury, or death. 5. What if there is a serious problem? An allergic reaction could occur after the vaccinated person leaves the clinic. If you see signs of a severe allergic reaction (hives, swelling of the face and throat, difficulty breathing, a fast heartbeat, dizziness, or weakness), call 9-1-1 and get the person to the nearest  hospital. For other signs that concern you, call your health care provider. Adverse reactions should be reported to the Vaccine Adverse Event Reporting System (VAERS). Your health care provider will usually file this report, or you can do it  yourself. Visit the VAERS website at www.vaers.LAgents.no or call 304-706-6542.VAERS is only for reporting reactions, and VAERS staff do not give medical advice. 6. The National Vaccine Injury Compensation Program The [employer] Vaccine Injury Compensation Program (VICP) is a federal program that was created to compensate people who may have been injured by certain vaccines. Visit the VICP website at SpiritualWord.at or call 818-323-1918 to learn about the program and about filing a claim. There is a time limit to file a claim for compensation. 7. How can I learn more?  Ask your healthcare provider.  Call your local or state health department.  Contact the Centers for Disease Control and Prevention (CDC): ? Call 518 641 2999 (1-800-CDC-INFO) or ? Visit CDC's BiotechRoom.com.cy Vaccine Information Statement (Interim) Inactivated Influenza Vaccine (02/03/2018) This information is not intended to replace advice given to you by your health care provider. Make sure you discuss any questions you have with your health care provider. Document Revised: 09/27/2018 Document Reviewed: 02/07/2018 Elsevier Patient Education  2020 [employer].

## 2020-05-03 NOTE — Assessment & Plan Note (Signed)
Chronic, ongoing.  Denies SI/HI.  Continue Duloxetine as ordered, refills sent.  Adjust doses as needed.  Return in 6 months.

## 2020-05-03 NOTE — Assessment & Plan Note (Signed)
Recommended eating smaller high protein, low fat meals more frequently and exercising 30 mins a day 5 times a week with a goal of 10-15lb weight loss in the next 3 months. Patient voiced their understanding and motivation to adhere to these recommendations.  

## 2020-05-03 NOTE — Assessment & Plan Note (Signed)
Mammogram ordered today 

## 2020-05-03 NOTE — Progress Notes (Signed)
BP 103/64   Pulse 89   Temp 98.3 F (36.8 C) (Oral)   Ht 5\' 5"  (1.651 m)   Wt 201 lb 3.2 oz (91.3 kg)   LMP  (LMP Unknown)   SpO2 95%   BMI 33.48 kg/m    Subjective:    Patient ID: , female    DOB: 1975/11/14, 44 y.o.   MRN: [id number]  HPI: Tammy Stout is a 44 y.o. female presenting on 05/03/2020 for comprehensive medical examination. Current medical complaints include:none  She currently lives with: husband Menopausal Symptoms: no   MIGRAINES Continues on Metoprolol, Imitrex and Duloxetine, this is for mood and migraines.  Duration:chronic Headache status at time of visit:asymptomatic Treatments attempted:aleve, excedrin and triptans Aura:no Nausea:yes Vomiting:no Photophobia:yes Phonophobia:yes Effect on social functioning:yes Numbers of missed days of school/work each month:none Confusion:no Gait disturbance/ataxia:no Behavioral changes:no Fevers:no  The 10-year ASCVD risk score 13/05/2020 DC Jr., et al., 2013) is: 0.4%   Values used to calculate the score:     Age: 82 years     Sex: Female     Is Non-Hispanic African American: No     Diabetic: No     Tobacco smoker: No     Systolic Blood Pressure: 103 mmHg     Is BP treated: No     HDL Cholesterol: 72 mg/dL     Total Cholesterol: 233 mg/dL   Depression Screen done today and results listed below:  Depression screen [facility] 2/9 05/03/2020 10/17/2019 08/02/2018 01/17/2018 01/12/2018  Decreased Interest 0 0 0 0 0  Down, Depressed, Hopeless 0 0 0 0 2  PHQ - 2 Score 0 0 0 0 2  Altered sleeping 0 0 2 0 0  Tired, decreased energy 0 0 0 0 0  Change in appetite 0 0 0 0 0  Feeling bad or failure about yourself  0 0 0 0 0  Trouble concentrating 0 0 0 0 0  Moving slowly or fidgety/restless 0 0 0 0 0  Suicidal thoughts 0 0 0 0 0  PHQ-9 Score 0 0 2 0 2  Difficult doing work/chores Not difficult at all Not difficult at all Not difficult at all - -    The patient does not have  a history of falls. I did not complete a risk assessment for falls. A plan of care for falls was not documented.   Past Medical History:  Past Medical History:  Diagnosis Date  . Depression   . Dermatitis 2012  . Eczema   . Perioral dermatitis   . Tendonitis    right hand    Surgical History:  Past Surgical History:  Procedure Laterality Date  . CESAREAN SECTION  05/2006   SECTION DUE TO NO DIALATIONA AND CORD WAS AROUND BABY NECK  . FOOT TENDON SURGERY  06/03/2019   Plantar fascitis    Medications:  Current Outpatient Medications on File Prior to Visit  Medication Sig  . cetirizine (ZYRTEC) 10 MG tablet Take 10 mg by mouth daily.  . DULoxetine (CYMBALTA) 60 MG capsule TAKE ONE (1) CAPSULE EACH DAY.  . metoprolol tartrate (LOPRESSOR) 25 MG tablet Take 1 tablet (25 mg total) by mouth 2 (two) times daily.  14/05/2019 rOPINIRole (REQUIP) 0.25 MG tablet TAKE ONE TABLET BY MOUTH AT BEDTIME.  . SUMAtriptan (IMITREX) 50 MG tablet Take 1 tablet (50 mg total) by mouth every 2 (two) hours as needed for migraine. May repeat in 2 hours if headache persists or recurs.  Marland Kitchen  meloxicam (MOBIC) 15 MG tablet TAKE (1) TABLET BY MOUTH EVERY DAY (Patient not taking: Reported on 05/03/2020)   No current facility-administered medications on file prior to visit.    Allergies:  No Known Allergies  Social History:  Social History   Socioeconomic History  . Marital status: Married    Spouse name: Not on file  . Number of children: Not on file  . Years of education: Not on file  . Highest education level: Not on file  Occupational History  . Not on file  Tobacco Use  . Smoking status: Never Smoker  . Smokeless tobacco: Never Used  Vaping Use  . Vaping Use: Never used  Substance and Sexual Activity  . Alcohol use: Yes    Comment: social  . Drug use: No  . Sexual activity: Yes  Other Topics Concern  . Not on file  Social History Narrative  . Not on file   Social Determinants of Health    Financial Resource Strain:   . Difficulty of Paying Living Expenses: Not on file  Food Insecurity:   . Worried About Programme researcher, broadcasting/film/video in the Last Year: Not on file  . Ran Out of Food in the Last Year: Not on file  Transportation Needs:   . Lack of Transportation (Medical): Not on file  . Lack of Transportation (Non-Medical): Not on file  Physical Activity:   . Days of Exercise per Week: Not on file  . Minutes of Exercise per Session: Not on file  Stress:   . Feeling of Stress : Not on file  Social Connections:   . Frequency of Communication with Friends and Family: Not on file  . Frequency of Social Gatherings with Friends and Family: Not on file  . Attends Religious Services: Not on file  . Active Member of Clubs or Organizations: Not on file  . Attends Banker Meetings: Not on file  . Marital Status: Not on file  Intimate Partner Violence:   . Fear of Current or Ex-Partner: Not on file  . Emotionally Abused: Not on file  . Physically Abused: Not on file  . Sexually Abused: Not on file   Social History   Tobacco Use  Smoking Status Never Smoker  Smokeless Tobacco Never Used   Social History   Substance and Sexual Activity  Alcohol Use Yes   Comment: social    Family History:  Family History  Problem Relation Age of Onset  . Cancer Paternal Grandmother 79       kidney cancer  . Cancer Maternal Grandmother 80       lung cancer  . Diabetes Maternal Grandmother   . Diabetes Mother   . Cancer Mother 20       One breast  . Thyroid disease Father   . Hyperlipidemia Father   . Diabetes Maternal Grandfather   . Thyroid disease Maternal Aunt     Past medical history, surgical history, medications, allergies, family history and social history reviewed with patient today and changes made to appropriate areas of the chart.   Review of Systems - negative All other ROS negative except what is listed above and in the HPI.      Objective:    BP  103/64   Pulse 89   Temp 98.3 F (36.8 C) (Oral)   Ht  (1.651 m)   Wt 201 lb 3.2 oz (91.3 kg)   LMP  (LMP Unknown)   SpO2 95%   BMI  33.48 kg/m   Wt Readings from Last 3 Encounters:  05/03/20 201 lb 3.2 oz (91.3 kg)  10/03/19 197 lb 2 oz (89.4 kg)  11/08/18 180 lb (81.6 kg)    Physical Exam Vitals and nursing note reviewed.  Constitutional:      General: She is awake. She is not in acute distress.    Appearance: She is well-developed and well-groomed. She is obese. She is not ill-appearing.  HENT:     Head: Normocephalic and atraumatic.     Right Ear: Hearing, tympanic membrane, ear canal and external ear normal. No drainage.     Left Ear: Hearing, tympanic membrane, ear canal and external ear normal. No drainage.     Nose: Nose normal.     Right Sinus: No maxillary sinus tenderness or frontal sinus tenderness.     Left Sinus: No maxillary sinus tenderness or frontal sinus tenderness.     Mouth/Throat:     Mouth: Mucous membranes are moist.     Pharynx: Oropharynx is clear. Uvula midline. No pharyngeal swelling, oropharyngeal exudate or posterior oropharyngeal erythema.  Eyes:     General: Lids are normal.        Right eye: No discharge.        Left eye: No discharge.     Extraocular Movements: Extraocular movements intact.     Conjunctiva/sclera: Conjunctivae normal.     Pupils: Pupils are equal, round, and reactive to light.     Visual Fields: Right eye visual fields normal and left eye visual fields normal.  Neck:     Thyroid: No thyromegaly.     Vascular: No carotid bruit.     Trachea: Trachea normal.  Cardiovascular:     Rate and Rhythm: Normal rate and regular rhythm.     Heart sounds: Normal heart sounds. No murmur heard.  No gallop.   Pulmonary:     Effort: Pulmonary effort is normal. No accessory muscle usage or respiratory distress.     Breath sounds: Normal breath sounds.  Chest:     Breasts:        Right: Normal.        Left: Normal.  Abdominal:      General: Bowel sounds are normal.     Palpations: Abdomen is soft. There is no hepatomegaly or splenomegaly.     Tenderness: There is no abdominal tenderness.  Musculoskeletal:        General: Normal range of motion.     Cervical back: Normal range of motion and neck supple.     Right lower leg: No edema.     Left lower leg: No edema.  Lymphadenopathy:     Head:     Right side of head: No submental, submandibular, tonsillar, preauricular or posterior auricular adenopathy.     Left side of head: No submental, submandibular, tonsillar, preauricular or posterior auricular adenopathy.     Cervical: No cervical adenopathy.     Upper Body:     Right upper body: No supraclavicular, axillary or pectoral adenopathy.     Left upper body: No supraclavicular, axillary or pectoral adenopathy.  Skin:    General: Skin is warm and dry.     Capillary Refill: Capillary refill takes less than 2 seconds.     Findings: No rash.  Neurological:     Mental Status: She is alert and oriented to person, place, and time.     Cranial Nerves: Cranial nerves are intact.     Gait: Gait is intact.  Deep Tendon Reflexes: Reflexes are normal and symmetric.     Reflex Scores:      Brachioradialis reflexes are 2+ on the right side and 2+ on the left side.      Patellar reflexes are 2+ on the right side and 2+ on the left side. Psychiatric:        Attention and Perception: Attention normal.        Mood and Affect: Mood normal.        Speech: Speech normal.        Behavior: Behavior normal. Behavior is cooperative.        Thought Content: Thought content normal.        Judgment: Judgment normal.     Results for orders placed or performed in visit on 10/03/19  TSH  Result Value Ref Range   TSH 1.960 0.450 - 4.500 uIU/mL  Comprehensive metabolic panel  Result Value Ref Range   Glucose 90 65 - 99 mg/dL   BUN 15 6 - 24 mg/dL   Creatinine, Ser 1.611.16 (H) 0.57 - 1.00 mg/dL   GFR calc non Af Amer 58 (L) >59  mL/min/1.73   GFR calc Af Amer 67 >59 mL/min/1.73   BUN/Creatinine Ratio 13 9 - 23   Sodium 143 134 - 144 mmol/L   Potassium 3.8 3.5 - 5.2 mmol/L   Chloride 101 96 - 106 mmol/L   CO2 24 20 - 29 mmol/L   Calcium 9.5 8.7 - 10.2 mg/dL   Total Protein 6.7 6.0 - 8.5 g/dL   Albumin 4.5 3.8 - 4.8 g/dL   Globulin, Total 2.2 1.5 - 4.5 g/dL   Albumin/Globulin Ratio 2.0 1.2 - 2.2   Bilirubin Total <0.2 0.0 - 1.2 mg/dL   Alkaline Phosphatase 85 39 - 117 IU/L   AST 20 0 - 40 IU/L   ALT 15 0 - 32 IU/L      Assessment & Plan:   Problem List Items Addressed This Visit      Cardiovascular and Mediastinum   Migraine    Chronic and stable.  Continue Metoprolol + Imitrex for acute episodes only.  Recommend she continue to monitor HR and notify provider if levels <60 consistently.  Will adjust Metoprolol dose as needed based on symptoms.  Return in 6 months, sooner if any worsening symptoms.        Other   Family history of breast cancer in female    Mammogram ordered today      Depression - Primary    Chronic, ongoing.  Denies SI/HI.  Continue Duloxetine as ordered, refills sent.  Adjust doses as needed.  Return in 6 months.      Hyperlipidemia    Noted on past labs with ASCVD 0.4%.  Continue diet and exercise focus, lipid panel today.      Relevant Orders   Lipid Panel w/o Chol/HDL Ratio   Obesity    Recommended eating smaller high protein, low fat meals more frequently and exercising 30 mins a day 5 times a week with a goal of 10-15lb weight loss in the next 3 months. Patient voiced their understanding and motivation to adhere to these recommendations.        Other Visit Diagnoses    Encounter for annual physical exam       Annual labs today to include CBC, CMP, TSH, lipid   Relevant Orders   CBC with Differential/Platelet   Comprehensive metabolic panel   Encounter for screening mammogram for malignant neoplasm of  breast       Mammogram ordered   Relevant Orders   MM DIGITAL  SCREENING BILATERAL   Need for hepatitis C screening test       Hep C screening on labs today   Relevant Orders   Hepatitis C antibody   Thyroid disorder screen       TSH on labs today   Relevant Orders   TSH   Need for influenza vaccination       Flu vaccine today   Relevant Orders   Flu Vaccine QUAD 36+ mos IM (Completed)       Follow up plan: Return in about 6 months (around 10/31/2020) for Migraines and Mood.   LABORATORY TESTING:  - Pap smear: up to date  IMMUNIZATIONS:   - Tdap: Tetanus vaccination status reviewed: last tetanus booster within 10 years. - Influenza: Up to date - Pneumovax: Not applicable - Prevnar: Not applicable - HPV: Not applicable - Zostavax vaccine: Not applicable  SCREENING: -Mammogram: Up to date  - Colonoscopy: Not applicable  - Bone Density: Not applicable  -Hearing Test: Not applicable  -Spirometry: Not applicable   PATIENT COUNSELING:   Advised to take 1 mg of folate supplement per day if capable of pregnancy.   Sexuality: Discussed sexually transmitted diseases, partner selection, use of condoms, avoidance of unintended pregnancy  and contraceptive alternatives.   Advised to avoid cigarette smoking.  I discussed with the patient that most people either abstain from alcohol or drink within safe limits (<=14/week and <=4 drinks/occasion for males, <=7/weeks and <= 3 drinks/occasion for females) and that the risk for alcohol disorders and other health effects rises proportionally with the number of drinks per week and how often a drinker exceeds daily limits.  Discussed cessation/primary prevention of drug use and availability of treatment for abuse.   Diet: Encouraged to adjust caloric intake to maintain  or achieve ideal body weight, to reduce intake of dietary saturated fat and total fat, to limit sodium intake by avoiding high sodium foods and not adding table salt, and to maintain adequate dietary potassium and calcium preferably  from fresh fruits, vegetables, and low-fat dairy products.    Stressed the importance of regular exercise  Injury prevention: Discussed safety belts, safety helmets, smoke detector, smoking near bedding or upholstery.   Dental health: Discussed importance of regular tooth brushing, flossing, and dental visits.    NEXT PREVENTATIVE PHYSICAL DUE IN 1 YEAR. Return in about 6 months (around 10/31/2020) for Migraines and Mood.

## 2020-05-03 NOTE — Assessment & Plan Note (Signed)
Chronic and stable.  Continue Metoprolol + Imitrex for acute episodes only.  Recommend she continue to monitor HR and notify provider if levels <60 consistently.  Will adjust Metoprolol dose as needed based on symptoms.  Return in 6 months, sooner if any worsening symptoms.

## 2020-05-03 NOTE — Assessment & Plan Note (Signed)
Noted on past labs with ASCVD 0.4%.  Continue diet and exercise focus, lipid panel today.

## 2020-05-04 LAB — COMPREHENSIVE METABOLIC PANEL
ALT: 11 IU/L (ref 0–32)
AST: 15 IU/L (ref 0–40)
Albumin/Globulin Ratio: 2 (ref 1.2–2.2)
Albumin: 4.4 g/dL (ref 3.8–4.8)
Alkaline Phosphatase: 94 IU/L (ref 44–121)
BUN/Creatinine Ratio: 16 (ref 9–23)
BUN: 12 mg/dL (ref 6–24)
Bilirubin Total: <0.2 mg/dL (ref 0.0–1.2)
CO2: 28 mmol/L (ref 20–29)
Calcium: 9.1 mg/dL (ref 8.7–10.2)
Chloride: 101 mmol/L (ref 96–106)
Creatinine, Ser: 0.74 mg/dL (ref 0.57–1.00)
GFR calc Af Amer: 115 mL/min/{1.73_m2} (ref >59)
GFR calc non Af Amer: 100 mL/min/{1.73_m2} (ref >59)
Globulin, Total: 2.2 g/dL (ref 1.5–4.5)
Glucose: 115 mg/dL — ABNORMAL HIGH (ref 65–99)
Potassium: 3.4 mmol/L — ABNORMAL LOW (ref 3.5–5.2)
Sodium: 141 mmol/L (ref 134–144)
Total Protein: 6.6 g/dL (ref 6.0–8.5)

## 2020-05-04 LAB — CBC WITH DIFFERENTIAL/PLATELET
Basophils Absolute: 0.1 10*3/uL (ref 0.0–0.2)
Basos: 1 %
EOS (ABSOLUTE): 0.4 10*3/uL (ref 0.0–0.4)
Eos: 5 %
Hematocrit: 35.9 % (ref 34.0–46.6)
Hemoglobin: 11.7 g/dL (ref 11.1–15.9)
Immature Grans (Abs): 0 10*3/uL (ref 0.0–0.1)
Immature Granulocytes: 1 %
Lymphocytes Absolute: 1.8 10*3/uL (ref 0.7–3.1)
Lymphs: 23 %
MCH: 29.9 pg (ref 26.6–33.0)
MCHC: 32.6 g/dL (ref 31.5–35.7)
MCV: 92 fL (ref 79–97)
Monocytes Absolute: 0.6 10*3/uL (ref 0.1–0.9)
Monocytes: 7 %
Neutrophils Absolute: 5 10*3/uL (ref 1.4–7.0)
Neutrophils: 63 %
Platelets: 273 10*3/uL (ref 150–450)
RBC: 3.91 x10E6/uL (ref 3.77–5.28)
RDW: 12.2 % (ref 11.7–15.4)
WBC: 7.8 10*3/uL (ref 3.4–10.8)

## 2020-05-04 LAB — TSH: TSH: 2.25 u[IU]/mL (ref 0.450–4.500)

## 2020-05-04 LAB — HEPATITIS C ANTIBODY: Hep C Virus Ab: <0.1 s/co ratio (ref 0.0–0.9)

## 2020-05-04 LAB — LIPID PANEL W/O CHOL/HDL RATIO
Cholesterol, Total: 226 mg/dL — ABNORMAL HIGH (ref 100–199)
HDL: 59 mg/dL (ref >39)
LDL Chol Calc (NIH): 143 mg/dL — ABNORMAL HIGH (ref 0–99)
Triglycerides: 136 mg/dL (ref 0–149)
VLDL Cholesterol Cal: 24 mg/dL (ref 5–40)

## 2020-05-05 ENCOUNTER — Other Ambulatory Visit: Payer: Self-pay | Admitting: Nurse Practitioner

## 2020-05-05 DIAGNOSIS — E876 Hypokalemia: Secondary | ICD-10-CM

## 2020-05-05 DIAGNOSIS — R7309 Other abnormal glucose: Secondary | ICD-10-CM

## 2020-05-05 NOTE — Progress Notes (Signed)
Contacted via MyChart The 10-year ASCVD risk score Denman George DC Jr., et al., 2013) is: 0.5%   Values used to calculate the score:     Age: 44 years     Sex: Female     Is Non-Hispanic African American: No     Diabetic: No     Tobacco smoker: No     Systolic Blood Pressure: 103 mmHg     Is BP treated: No     HDL Cholesterol: 59 mg/dL     Total Cholesterol: 226 mg/dL  Good morning Tammy Stout, your labs have returned: - CBC and thyroid are normal - Glucose (sugar) levels is mildly elevated and potassium level is mildly low.  I would like you to schedule an outpatient lab only visit for 4 weeks to recheck these and I will also check A1C to ensure no diabetes or prediabetes is present.  For the potassium I recommend increasing foods high in potassium, like bananas, potatoes, and dried fruit + adding a daily multivitamin.   - Your cholesterol is still high, but continued recommendations to make lifestyle changes. Your LDL is above normal. The LDL is the bad cholesterol. Over time and in combination with inflammation and other factors, this contributes to plaque which in turn may lead to stroke and/or heart attack down the road. Sometimes high LDL is primarily genetic, and people might be eating all the right foods but still have high numbers. Other times, there is room for improvement in one's diet and eating healthier can bring this number down and potentially reduce one's risk of heart attack and/or stroke.   To reduce your LDL, Remember - more fruits and vegetables, more fish, and limit red meat and dairy products. More soy, nuts, beans, barley, lentils, oats and plant sterol ester enriched margarine instead of butter. I also encourage eliminating sugar and processed food. Remember, shop on the outside of the grocery store and visit your [employer]. If you would like to talk with me about dietary changes plus or minus medications for your cholesterol, please let me know. We should recheck your  cholesterol in 6 months.  Any questions?  Please ensure to scheduled the 4 week lab visit only, do not need to visit with me for this, only labs:) Keep being awesome!!  Thank you for allowing me to participate in your care. Kindest regards, Ikechukwu Cerny

## 2020-05-28 ENCOUNTER — Telehealth: Payer: Self-pay

## 2020-05-29 ENCOUNTER — Other Ambulatory Visit: Payer: Self-pay | Admitting: Podiatry

## 2020-05-29 DIAGNOSIS — M79671 Pain in right foot: Secondary | ICD-10-CM

## 2020-05-29 NOTE — Progress Notes (Signed)
Authorization# [id number] good through 07/24/2020  Felecia Shelling, DPM [facility]  Dr. Felecia Shelling, DPM    [address] [place], [place] [address]                Office [phone]  Fax [phone]

## 2020-06-03 ENCOUNTER — Other Ambulatory Visit: Payer: Self-pay | Admitting: Nurse Practitioner

## 2020-06-03 NOTE — Telephone Encounter (Signed)
Approved per protocol.  Requested Prescriptions  Pending Prescriptions Disp Refills  . DULoxetine (CYMBALTA) 60 MG capsule [Pharmacy Med Name: DULOXETINE HCL 60 MG CAP] 90 capsule 1    Sig: TAKE (1) CAPSULE BY MOUTH EVERY DAY     Psychiatry: Antidepressants - SNRI Passed - 06/03/2020  9:18 AM      Passed - Completed PHQ-2 or PHQ-9 in the last 360 days      Passed - Last BP in normal range    BP Readings from Last 1 Encounters:  05/03/20 103/64         Passed - Valid encounter within last 6 months    Recent Outpatient Visits          1 month ago Major depressive disorder with single episode, in full remission (HCC)   [facility] [place], Jolene T, NP   7 months ago Menstrual migraine without status migrainosus, not intractable   [facility] [place], Jolene T, NP   8 months ago Menstrual migraine without status migrainosus, not intractable   [facility] [place], [place] T, NP   1 year ago Restless leg   [facility] [place], [place] T, NP   1 year ago Restless leg   [facility] [place], Megan P, DO      Future Appointments            In 5 months [place], Dorie Rank, NP [employer]

## 2020-06-09 ENCOUNTER — Other Ambulatory Visit: Payer: Self-pay

## 2020-06-09 ENCOUNTER — Ambulatory Visit
Admission: RE | Admit: 2020-06-09 | Discharge: 2020-06-09 | Disposition: A | Discharge status: Home / Self Care | Payer: Managed Care, Other (non HMO) | Source: Ambulatory Visit | Attending: Podiatry | Admitting: Podiatry

## 2020-06-09 DIAGNOSIS — M79671 Pain in right foot: Secondary | ICD-10-CM

## 2020-06-12 ENCOUNTER — Ambulatory Visit (INDEPENDENT_AMBULATORY_CARE_PROVIDER_SITE_OTHER): Payer: Managed Care, Other (non HMO) | Admitting: Podiatry

## 2020-06-12 ENCOUNTER — Other Ambulatory Visit: Payer: Self-pay

## 2020-06-12 DIAGNOSIS — M79671 Pain in right foot: Secondary | ICD-10-CM | POA: Diagnosis not present

## 2020-06-12 DIAGNOSIS — M722 Plantar fascial fibromatosis: Secondary | ICD-10-CM | POA: Diagnosis not present

## 2020-06-17 ENCOUNTER — Telehealth: Payer: Self-pay

## 2020-06-17 NOTE — Telephone Encounter (Signed)
DOS 06/20/2020  PLANTAR FIBROMA RT - 28062  [employer] EFFECTIVE DATE - 06/23/2019  PLAN DEDUCTIBLE - $2000.00 W/ $2000.00 MET OUT OF POCKET - $5000.00 W/ $4160.16 MET COPAY $0.00 COINSURANCE - 90%  PER [employer] AUTOMATED SYSTEM CPT 28062 DOES NOT REQUIRE PERCERT- CONF # [id number]

## 2020-06-17 NOTE — Progress Notes (Signed)
° °  HPI: 44 y.o. female presenting today for evaluation of right foot pain.  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  DOS: 06/01/2019.  Patient states that her pain is now worse and severe.  She is unable to put pressure on the foot without severe pain.  She did not hear anything back from the MRI since last visit and she states that she got busy but she presents now with severe pain and tenderness to the heel.  Past Medical History:  Diagnosis Date   Depression    Dermatitis 2012   Eczema    Perioral dermatitis    Tendonitis    right hand     Physical Exam: General: The patient is alert and oriented x3 in no acute distress.  Dermatology: Skin is warm, dry and supple bilateral lower extremities. Negative for open lesions or macerations.  Vascular: Palpable pedal pulses bilaterally. No edema or erythema noted. Capillary refill within normal limits.  Neurological: Epicritic and protective threshold grossly intact bilaterally.   Musculoskeletal Exam: Range of motion within normal limits to all pedal and ankle joints bilateral. Muscle strength 5/5 in all groups bilateral.  There continues to be severe pain and tenderness to the right plantar heel at the insertion of the plantar fascia.  With dorsiflexion the plantar fascia is actually loose compared to the contralateral limb.  MRI impression 06/09/2020: Plantar Fascia: Fusiform thickening of the central band of the plantar fascia with minimal perifascial edema. No plantar fascial Tear.  IMPRESSION: Mild plantar fasciitis. No plantar fascial tear.  Assessment: 1.  Chronic right heel pain/plantar fasciitis right 2.  H/o EPF RT. DOS: 06/01/2019  Plan of Care:  1. Patient evaluated.  MRI reviewed with the patient today 2. Today we discussed additional conservative versus surgical management of the presenting pathology based on the MRI impression. The patient opts for surgical management. All possible complications and details  of the procedure were explained. All patient questions were answered. No guarantees were expressed or implied. 3. Authorization for surgery was initiated today. Surgery will consist of open plantar fasciectomy right 4.  Patient may discontinue meloxicam.  I did send in prescription today for Medrol Dosepak.  Patient has been chronically taking meloxicam for temporary relief however it is now upsetting her stomach and she is having GI symptoms 5.  Continue wearing good supportive shoes.  OTC power step insoles were provided today. 6.  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 Felecia Shelling, DPM    [address] [place], [place] [address]                Office [phone]  Fax [phone]

## 2020-06-20 ENCOUNTER — Other Ambulatory Visit: Payer: Self-pay | Admitting: Podiatry

## 2020-06-20 DIAGNOSIS — M722 Plantar fascial fibromatosis: Secondary | ICD-10-CM

## 2020-06-20 MED ORDER — OXYCODONE-ACETAMINOPHEN 5-325 MG PO TABS: 1.0000 | ORAL_TABLET | ORAL | 0 refills | Status: DC | PRN

## 2020-06-20 NOTE — Progress Notes (Signed)
PRN postop 

## 2020-06-26 ENCOUNTER — Ambulatory Visit (INDEPENDENT_AMBULATORY_CARE_PROVIDER_SITE_OTHER): Payer: Managed Care, Other (non HMO) | Admitting: Podiatry

## 2020-06-26 ENCOUNTER — Other Ambulatory Visit: Payer: Self-pay

## 2020-06-26 DIAGNOSIS — Z9889 Other specified postprocedural states: Secondary | ICD-10-CM

## 2020-06-26 DIAGNOSIS — M722 Plantar fascial fibromatosis: Secondary | ICD-10-CM

## 2020-06-26 MED ORDER — IBUPROFEN 800 MG PO TABS: 800.0000 mg | ORAL_TABLET | Freq: Three times a day (TID) | ORAL | 1 refills | Status: DC

## 2020-07-01 NOTE — Progress Notes (Signed)
   Subjective:  Patient presents today status post open plantar fasciotomy/fasciectomy right foot. DOS: 06/20/2020.  Patient states that she is feeling very well.  She is kept dressings clean dry and intact.  No new complaints at this time.  Past Medical History:  Diagnosis Date  . Depression   . Dermatitis 2012  . Eczema   . Perioral dermatitis   . Tendonitis    right hand      Objective/Physical Exam Neurovascular status intact.  Skin incisions appear to be well coapted with sutures intact. No sign of infectious process noted. No dehiscence. No active bleeding noted. Moderate edema noted to the surgical extremity.  Assessment: 1. s/p open plantar fasciotomy/fasciectomy right. DOS: 06/20/2020   Plan of Care:  1. Patient was evaluated.  2.  Dressing changed today.  Keep clean dry and intact x1 week. 3.  Order placed for knee scooter.  The patient would like to pick the knee scooter up in the [place] office 4.  Handicap parking placard provided today 5.  Prescription for Motrin 800 mg 3 times daily 6.  Patient's expected return to work date is 07/01/2020 with restrictions 7.  Return to clinic in 10 days for suture removal    Felecia Shelling, DPM [facility]  Dr. Felecia Shelling, DPM    [address] [place], [place] [address]                Office [phone]  Fax [phone]

## 2020-07-03 ENCOUNTER — Encounter: Payer: Managed Care, Other (non HMO) | Admitting: Podiatry

## 2020-07-09 ENCOUNTER — Encounter: Payer: Self-pay | Admitting: Podiatry

## 2020-07-09 ENCOUNTER — Other Ambulatory Visit: Payer: Self-pay

## 2020-07-09 ENCOUNTER — Ambulatory Visit (INDEPENDENT_AMBULATORY_CARE_PROVIDER_SITE_OTHER): Payer: Managed Care, Other (non HMO) | Admitting: Podiatry

## 2020-07-09 VITALS — Temp 96.4°F

## 2020-07-09 DIAGNOSIS — M722 Plantar fascial fibromatosis: Secondary | ICD-10-CM

## 2020-07-09 DIAGNOSIS — Z9889 Other specified postprocedural states: Secondary | ICD-10-CM

## 2020-07-10 ENCOUNTER — Encounter: Payer: Self-pay | Admitting: Podiatry

## 2020-07-10 NOTE — Progress Notes (Signed)
° °  Subjective:  Patient presents today status post open plantar fasciotomy/fasciectomy right foot. DOS: 06/20/2020.  Patient states that she is feeling very well.  She is kept dressings clean dry and intact.  No new complaints at this time.  Past Medical History:  Diagnosis Date   Depression    Dermatitis 2012   Eczema    Perioral dermatitis    Tendonitis    right hand      Objective/Physical Exam Neurovascular status intact.  Skin incisions appear to be well coapted with sutures intact. No sign of infectious process noted. No dehiscence. No active bleeding noted. Moderate edema noted to the surgical extremity.  Assessment: 1. s/p open plantar fasciotomy/fasciectomy right. DOS: 06/20/2020   Plan of Care:  1. Patient was evaluated.  2.  Dressing changed today.  Keep clean dry and intact x1 week. 3.  Nonweightbearing with a knee scooter. 4.  Continue using handicap parking placard 5.  Continue using Motrin 800 mg 3 times a day as needed 6.  Patient's expected return to work date is 07/01/2020 with restrictions 7.  Sutures were removed without any complication.

## 2020-07-22 ENCOUNTER — Encounter: Payer: Managed Care, Other (non HMO) | Admitting: Podiatry

## 2020-07-23 ENCOUNTER — Ambulatory Visit (INDEPENDENT_AMBULATORY_CARE_PROVIDER_SITE_OTHER): Payer: Managed Care, Other (non HMO) | Admitting: Podiatry

## 2020-07-23 ENCOUNTER — Other Ambulatory Visit: Payer: Self-pay

## 2020-07-23 DIAGNOSIS — Z9889 Other specified postprocedural states: Secondary | ICD-10-CM

## 2020-07-23 DIAGNOSIS — M722 Plantar fascial fibromatosis: Secondary | ICD-10-CM

## 2020-07-23 NOTE — Progress Notes (Signed)
   Subjective:  Patient presents today status post open plantar fasciotomy/fasciectomy right foot. DOS: 06/20/2020.  Patient states that she is feeling very well.  She has noticed significant improvement.  She states that when she gets out of bed in the morning she does not have that sharp stabbing pain that she used to have before surgery.  No new complaints at this time  Past Medical History:  Diagnosis Date  . Depression   . Dermatitis 2012  . Eczema   . Perioral dermatitis   . Tendonitis    right hand      Objective/Physical Exam Neurovascular status intact.  Skin incisions appear to be well coapted and healed. No sign of infectious process noted. No dehiscence. No active bleeding noted.  Negative for any significant edema noted to the surgical extremity.  There is also negative tenderness to palpation to the plantar heel  Assessment: 1. s/p open plantar fasciotomy/fasciectomy right. DOS: 06/20/2020   Plan of Care:  1. Patient was evaluated.  2.  Patient may begin to transition out of the cam boot into good supportive shoes. 3.  Slowly transition off of the knee scooter 4.  Patient may slowly transition to increased activity in small increments 5.  Recommend daily stretching exercises and massage to the area 6.  Return to clinic in 6 weeks    Felecia Shelling, DPM [facility]  Dr. Felecia Shelling, DPM    [address] [place], [place] [address]                Office [phone]  Fax [phone]

## 2020-08-26 ENCOUNTER — Other Ambulatory Visit: Payer: Self-pay | Admitting: Family Medicine

## 2020-09-03 ENCOUNTER — Other Ambulatory Visit: Payer: Self-pay

## 2020-09-03 ENCOUNTER — Ambulatory Visit (INDEPENDENT_AMBULATORY_CARE_PROVIDER_SITE_OTHER): Payer: Managed Care, Other (non HMO) | Admitting: Podiatry

## 2020-09-03 DIAGNOSIS — Z9889 Other specified postprocedural states: Secondary | ICD-10-CM

## 2020-09-03 DIAGNOSIS — M722 Plantar fascial fibromatosis: Secondary | ICD-10-CM

## 2020-09-03 NOTE — Progress Notes (Signed)
   Subjective:  Patient presents today status post open plantar fasciotomy/fasciectomy right foot. DOS: 06/20/2020.  Overall the patient states that she is doing significantly better.  She states that in the morning she does not experience any's associated pain to her foot.  She begins to experience pain in the afternoons after being on her feet all day.  Overall there is improvement  Past Medical History:  Diagnosis Date  . Depression   . Dermatitis 2012  . Eczema   . Perioral dermatitis   . Tendonitis    right hand      Objective/Physical Exam Neurovascular status intact.  Skin incisions appear to be well coapted and healed. No sign of infectious process noted. No dehiscence. No active bleeding noted.  Negative for any significant edema noted to the surgical extremity.  There is also negative tenderness to palpation to the plantar heel  Assessment: 1. s/p open plantar fasciotomy/fasciectomy right. DOS: 06/20/2020   Plan of Care:  1. Patient was evaluated.  Patient states that overall there has been some significant improvement.  She only begins to experience foot pain in the afternoons after being on her feet all morning 2.  Continue wearing good supportive shoes 3.  Continue to increase activity daily  4.  Return to clinic in 3 months for final follow-up    Felecia Shelling, DPM [facility]  Dr. Felecia Shelling, DPM    [address] [place], [place] [address]                Office [phone]  Fax [phone]

## 2020-09-09 ENCOUNTER — Other Ambulatory Visit: Payer: Self-pay | Admitting: Family Medicine

## 2020-09-09 NOTE — Telephone Encounter (Signed)
   Notes to clinic:  this script has expired Review for continue use and refill    Requested Prescriptions  Pending Prescriptions Disp Refills   SUMAtriptan (IMITREX) 50 MG tablet [Pharmacy Med Name: SUMATRIPTAN SUCCINATE 50 MG TAB] 10 tablet 12    Sig: TAKE 1 TABLET ONCE AS NEEDED FOR MIGRAINE FOR 1 DOSE...  MAY REPEAT A SECOND AFTER 2 HOURS IF NEEDED...      Neurology:  Migraine Therapy - Triptan Passed - 09/09/2020  8:17 AM      Passed - Last BP in normal range    BP Readings from Last 1 Encounters:  05/03/20 103/64          Passed - Valid encounter within last 12 months    Recent Outpatient Visits           4 months ago Major depressive disorder with single episode, in full remission (HCC)   [facility] [place], Jolene T, NP   10 months ago Menstrual migraine without status migrainosus, not intractable   [facility] [place], Jolene T, NP   11 months ago Menstrual migraine without status migrainosus, not intractable   [facility] [place], [place] T, NP   1 year ago Restless leg   [facility] [place], H[place] T, NP   1 year ago Restless leg   [facility] [place], Megan P, DO       Future Appointments             In 1 month Cannady, Dorie Rank, NP [facility]

## 2020-09-09 NOTE — Telephone Encounter (Signed)
Routing to provider  

## 2020-09-13 ENCOUNTER — Ambulatory Visit (INDEPENDENT_AMBULATORY_CARE_PROVIDER_SITE_OTHER): Payer: Managed Care, Other (non HMO) | Admitting: Podiatry

## 2020-09-13 ENCOUNTER — Ambulatory Visit (INDEPENDENT_AMBULATORY_CARE_PROVIDER_SITE_OTHER): Payer: Managed Care, Other (non HMO)

## 2020-09-13 ENCOUNTER — Other Ambulatory Visit: Payer: Self-pay

## 2020-09-13 DIAGNOSIS — M79671 Pain in right foot: Secondary | ICD-10-CM | POA: Diagnosis not present

## 2020-09-13 DIAGNOSIS — M76811 Anterior tibial syndrome, right leg: Secondary | ICD-10-CM | POA: Diagnosis not present

## 2020-09-13 MED ORDER — METHYLPREDNISOLONE 4 MG PO TBPK: ORAL_TABLET | ORAL | 0 refills | Status: AC

## 2020-09-18 ENCOUNTER — Encounter: Payer: Self-pay | Admitting: Podiatry
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
## 2020-09-23 ENCOUNTER — Telehealth: Payer: Self-pay

## 2020-10-15 ENCOUNTER — Ambulatory Visit: Payer: Managed Care, Other (non HMO) | Admitting: Podiatry

## 2020-10-22 ENCOUNTER — Ambulatory Visit (INDEPENDENT_AMBULATORY_CARE_PROVIDER_SITE_OTHER): Payer: Managed Care, Other (non HMO) | Admitting: Podiatry

## 2020-10-22 ENCOUNTER — Other Ambulatory Visit: Payer: Self-pay

## 2020-10-22 DIAGNOSIS — M76811 Anterior tibial syndrome, right leg: Secondary | ICD-10-CM

## 2020-10-22 DIAGNOSIS — M722 Plantar fascial fibromatosis: Secondary | ICD-10-CM

## 2020-10-22 NOTE — Progress Notes (Signed)
   HPI: 45 y.o. female presenting today for follow-up evaluation of right anterior ankle pain as well as open plantar fasciotomy/fasciectomy right foot.  DOS: 06/20/2020.  Patient states that she continues to have some heel pain associated to the surgical area.  Most recently over the past month she has been experiencing ankle pain that extends distally over the dorsum of the foot and proximal up the medial aspect of the leg.  She denies a history of injury.  She has been wearing the cam boot for the past month.  She presents for further treatment and evaluation  Past Medical History:  Diagnosis Date  . Depression   . Dermatitis 2012  . Eczema   . Perioral dermatitis   . Tendonitis    right hand     Physical Exam: General: The patient is alert and oriented x3 in no acute distress.  Dermatology: Skin is warm, dry and supple bilateral lower extremities. Negative for open lesions or macerations.  Vascular: Palpable pedal pulses bilaterally. No edema or erythema noted. Capillary refill within normal limits.  Neurological: Epicritic and protective threshold grossly intact bilaterally.   Musculoskeletal Exam: Range of motion within normal limits to all pedal and ankle joints bilateral. Muscle strength 5/5 in all groups bilateral.  Pain on palpation to the anterior aspect of the ankle joint.  There is also some tenderness to palpation along the right heel  Assessment: 1.  Status post open plantar fasciotomy/fasciectomy right foot.  DOS: 06/20/2020 2.  Right anterior ankle pain   Plan of Care:  1. Patient evaluated.  2.  I do suspect that the anterior ankle pain extending over the dorsum of the foot and extending up the leg may be associated to the cam boot.  Discontinue cam boot. 3.  Order placed for physical therapy at [facility] 4.  Patient has meloxicam at home.  Resume daily. 5.  Return to clinic in 4 weeks      Felecia Shelling, DPM [facility]  Dr. Felecia Shelling,  DPM    [address] [place], [place] [address]                Office [phone]  Fax [phone]

## 2020-10-26 ENCOUNTER — Encounter: Payer: Self-pay | Admitting: Nurse Practitioner

## 2020-10-26 DIAGNOSIS — R7301 Impaired fasting glucose: Secondary | ICD-10-CM | POA: Insufficient documentation

## 2020-11-01 ENCOUNTER — Ambulatory Visit: Payer: Managed Care, Other (non HMO) | Admitting: Nurse Practitioner

## 2020-11-15 ENCOUNTER — Other Ambulatory Visit: Payer: Self-pay | Admitting: Nurse Practitioner

## 2020-11-15 NOTE — Telephone Encounter (Signed)
Requested Prescriptions  Pending Prescriptions Disp Refills  . metoprolol tartrate (LOPRESSOR) 25 MG tablet [Pharmacy Med Name: METOPROLOL TARTRATE 25 MG TAB] 60 tablet 0    Sig: TAKE (1) TABLET BY MOUTH TWICE DAILY     Cardiovascular:  Beta Blockers Failed - 11/15/2020  8:02 AM      Failed - Valid encounter within last 6 months    Recent Outpatient Visits          6 months ago Major depressive disorder with single episode, in full remission (HCC)   [facility] [place], [place] T, NP   1 year ago Menstrual migraine without status migrainosus, not intractable   [facility] [place], [address] T, NP   1 year ago Menstrual migraine without status migrainosus, not intractable   [facility] [place], [place] T, NP   1 year ago Restless leg   [facility] [place], [place] T, NP   2 years ago Restless leg   [facility] [place], [place], DO      Future Appointments            In 2 weeks Cannady, Dorie Rank, NP [facility]           Passed - Last BP in normal range    BP Readings from Last 1 Encounters:  05/03/20 103/64         Passed - Last Heart Rate in normal range    Pulse Readings from Last 1 Encounters:  05/03/20 89

## 2020-11-22 ENCOUNTER — Other Ambulatory Visit: Payer: Self-pay | Admitting: Nurse Practitioner

## 2020-11-22 ENCOUNTER — Ambulatory Visit: Payer: Managed Care, Other (non HMO) | Admitting: Podiatry

## 2020-11-29 ENCOUNTER — Ambulatory Visit (INDEPENDENT_AMBULATORY_CARE_PROVIDER_SITE_OTHER): Payer: Managed Care, Other (non HMO) | Admitting: Nurse Practitioner

## 2020-11-29 ENCOUNTER — Encounter: Payer: Self-pay | Admitting: Nurse Practitioner

## 2020-11-29 ENCOUNTER — Other Ambulatory Visit: Payer: Self-pay

## 2020-11-29 VITALS — BP 96/66 | HR 76 | Temp 98.5°F | Ht 65.35 in | Wt 206.4 lb

## 2020-11-29 DIAGNOSIS — R7301 Impaired fasting glucose: Secondary | ICD-10-CM | POA: Diagnosis not present

## 2020-11-29 DIAGNOSIS — M25511 Pain in right shoulder: Secondary | ICD-10-CM | POA: Insufficient documentation

## 2020-11-29 DIAGNOSIS — F325 Major depressive disorder, single episode, in full remission: Secondary | ICD-10-CM

## 2020-11-29 DIAGNOSIS — G43829 Menstrual migraine, not intractable, without status migrainosus: Secondary | ICD-10-CM

## 2020-11-29 DIAGNOSIS — E782 Mixed hyperlipidemia: Secondary | ICD-10-CM | POA: Diagnosis not present

## 2020-11-29 DIAGNOSIS — E6609 Other obesity due to excess calories: Secondary | ICD-10-CM

## 2020-11-29 DIAGNOSIS — Z6833 Body mass index (BMI) 33.0-33.9, adult: Secondary | ICD-10-CM

## 2020-11-29 NOTE — Progress Notes (Signed)
BP 96/66   Pulse 76   Temp 98.5 F (36.9 C) (Oral)   Ht 5' 5.35" (1.66 m)   Wt 206 lb 6.4 oz (93.6 kg)   SpO2 98%   BMI 33.98 kg/m    Subjective:    Patient ID: Tammy Stout, female    DOB: 29-Apr-1976, 45 y.o.   MRN: [id number]  HPI: Tammy Stout is a 45 y.o. female  Chief Complaint  Patient presents with   Migraine    Patient states headaches have been doing good   Mood   The 10-year ASCVD risk score Denman George(Goff [place] Montez HagemanJr., et al., 2013) is: 0.5%   Values used to calculate the score:     Age: 89 years     Sex: Female     Is Non-Hispanic African American: No     Diabetic: No     Tobacco smoker: No     Systolic Blood Pressure: 96 mmHg     Is BP treated: No     HDL Cholesterol: 59 mg/dL     Total Cholesterol: 226 mg/dL  Last labs noted mild elevation in glucose at 115 and potassium 3.4.    MIGRAINES Currently continues on Imitrex, Metoprolol, and Duloxetine, this is for mood and migraines.  Has about 2 migraines a month at this time.   Duration: chronic Onset: gradual Severity: 2-7/10 Quality: dull and throbbing Frequency: intermittent Location: left side frontal and around to back of left side head Headache duration: sometimes all night Radiation: yes Time of day headache occurs: varies Alleviating factors: Excedrin and Advil -- Imitrex helps more  Aggravating factors: menstrual cycle Headache status at time of visit: asymptomatic Treatments attempted: Treatments attempted: aleve", excedrine and triptans   Aura: no Nausea:  yes Vomiting: no Photophobia:  yes Phonophobia:  yes Effect on social functioning:  yes Numbers of missed days of school/work each month: none Confusion:  no Gait disturbance/ataxia:  no Behavioral changes:  no Fevers:  no  DEPRESSION Continues on Duloxetine for mood.   Mood status: controlled Satisfied with current treatment?: yes Symptom severity: moderate  Duration of current treatment : chronic Side effects: no Medication  compliance: good compliance Psychotherapy/counseling: yes in the past Previous psychiatric medications: Duloxetine Depressed mood: none Anxious mood: no Anhedonia: no Significant weight loss or gain: no Insomnia: none Fatigue: no Feelings of worthlessness or guilt: no Impaired concentration/indecisiveness: no Suicidal ideations: no Hopelessness: no Crying spells: no [facility] 11/29/2020 05/03/2020 10/17/2019 08/02/2018 01/17/2018  Decreased Interest 0 0 0 0 0  Down, Depressed, Hopeless 0 0 0 0 0  PHQ - 2 Score 0 0 0 0 0  Altered sleeping 0 0 0 2 0  Tired, decreased energy 0 0 0 0 0  Change in appetite 0 0 0 0 0  Feeling bad or failure about yourself  0 0 0 0 0  Trouble concentrating 0 0 0 0 0  Moving slowly or fidgety/restless 0 0 0 0 0  Suicidal thoughts 0 0 0 0 0  PHQ-9 Score 0 0 0 2 0  Difficult doing work/chores Not difficult at all Not difficult at all Not difficult at all Not difficult at all -    SHOULDER PAIN For two months to right arm.  Can not recall any injury or heavy lifting.  Duration: months Involved shoulder: right Mechanism of injury: unknown Location: lateral Onset:sudden Severity: 2/10  Quality:  sharp Frequency: intermittent Radiation: no Aggravating factors: movement  Alleviating factors: NSAIDs and  rest  Status: stable Treatments attempted: rest and ibuprofen  Relief with NSAIDs?:  moderate Weakness: no Numbness: no Decreased grip strength: no Redness: no Swelling: no Bruising: no Fevers: no   Relevant past medical, surgical, family and social history reviewed and updated as indicated. Interim medical history since our last visit reviewed. Allergies and medications reviewed and updated.  Review of Systems  Constitutional:  Negative for activity change, appetite change, diaphoresis, fatigue and fever.  Respiratory:  Negative for cough, chest tightness, shortness of breath and wheezing.   Cardiovascular:  Negative for chest  pain, palpitations and leg swelling.  Gastrointestinal: Negative.   Musculoskeletal:  Positive for arthralgias.  Neurological:  Positive for headaches. Negative for dizziness, tremors, facial asymmetry, speech difficulty, weakness and numbness.  Psychiatric/Behavioral: Negative.     Per HPI unless specifically indicated above     Objective:    BP 96/66   Pulse 76   Temp 98.5 F (36.9 C) (Oral)   Ht 5' 5.35" (1.66 m)   Wt 206 lb 6.4 oz (93.6 kg)   SpO2 98%   BMI 33.98 kg/m   Wt Readings from Last 3 Encounters:  11/29/20 206 lb 6.4 oz (93.6 kg)  05/03/20 201 lb 3.2 oz (91.3 kg)  10/03/19 197 lb 2 oz (89.4 kg)    Physical Exam Vitals and nursing note reviewed.  Constitutional:      General: She is awake. She is not in acute distress.    Appearance: She is well-developed and well-groomed. She is obese. She is not ill-appearing.  HENT:     Head: Normocephalic.     Right Ear: Hearing normal.     Left Ear: Hearing normal.  Eyes:     General: Lids are normal.        Right eye: No discharge.        Left eye: No discharge.     Conjunctiva/sclera: Conjunctivae normal.     Pupils: Pupils are equal, round, and reactive to light.  Neck:     Thyroid: No thyromegaly.     Vascular: No carotid bruit.  Cardiovascular:     Rate and Rhythm: Normal rate and regular rhythm.     Heart sounds: Normal heart sounds. No murmur heard.   No gallop.  Pulmonary:     Effort: Pulmonary effort is normal. No accessory muscle usage or respiratory distress.     Breath sounds: Normal breath sounds.  Abdominal:     General: Bowel sounds are normal.     Palpations: Abdomen is soft.  Musculoskeletal:     Right shoulder: Normal.     Left shoulder: Normal.     Cervical back: Normal range of motion and neck supple.     Right lower leg: No edema.     Left lower leg: No edema.  Skin:    General: Skin is warm and dry.  Neurological:     Mental Status: She is alert and oriented to person, place, and  time.     Cranial Nerves: Cranial nerves are intact.     Motor: Motor function is intact.     Coordination: Coordination is intact.     Gait: Gait is intact.     Deep Tendon Reflexes: Reflexes are normal and symmetric.     Reflex Scores:      Brachioradialis reflexes are 2+ on the right side and 2+ on the left side.      Patellar reflexes are 2+ on the right side and 2+ on the  left side. Psychiatric:        Attention and Perception: Attention normal.        Mood and Affect: Mood normal.        Speech: Speech normal.        Behavior: Behavior normal. Behavior is cooperative.        Thought Content: Thought content normal.    Results for orders placed or performed in visit on 05/03/20  CBC with Differential/Platelet  Result Value Ref Range   WBC 7.8 3.4 - 10.8 x10E3/uL   RBC 3.91 3.77 - 5.28 x10E6/uL   Hemoglobin 11.7 11.1 - 15.9 g/dL   Hematocrit 89.3 81.0 - 46.6 %   MCV 92 79 - 97 fL   MCH 29.9 26.6 - 33.0 pg   MCHC 32.6 31.5 - 35.7 g/dL   RDW 17.5 10.2 - 58.5 %   Platelets 273 150 - 450 x10E3/uL   Neutrophils 63 Not Estab. %   Lymphs 23 Not Estab. %   Monocytes 7 Not Estab. %   Eos 5 Not Estab. %   Basos 1 Not Estab. %   Neutrophils Absolute 5.0 1.4 - 7.0 x10E3/uL   Lymphocytes Absolute 1.8 0.7 - 3.1 x10E3/uL   Monocytes Absolute 0.6 0.1 - 0.9 x10E3/uL   EOS (ABSOLUTE) 0.4 0.0 - 0.4 x10E3/uL   Basophils Absolute 0.1 0.0 - 0.2 x10E3/uL   Immature Granulocytes 1 Not Estab. %   Immature Grans (Abs) 0.0 0.0 - 0.1 x10E3/uL  Comprehensive metabolic panel  Result Value Ref Range   Glucose 115 (H) 65 - 99 mg/dL   BUN 12 6 - 24 mg/dL   Creatinine, Ser 2.77 0.57 - 1.00 mg/dL   GFR calc non Af Amer 100 >59 mL/min/1.73   GFR calc Af Amer 115 >59 mL/min/1.73   BUN/Creatinine Ratio 16 9 - 23   Sodium 141 134 - 144 mmol/L   Potassium 3.4 (L) 3.5 - 5.2 mmol/L   Chloride 101 96 - 106 mmol/L   CO2 28 20 - 29 mmol/L   Calcium 9.1 8.7 - 10.2 mg/dL   Total Protein 6.6 6.0 - 8.5 g/dL    Albumin 4.4 3.8 - 4.8 g/dL   Globulin, Total 2.2 1.5 - 4.5 g/dL   Albumin/Globulin Ratio 2.0 1.2 - 2.2   Bilirubin Total <0.2 0.0 - 1.2 mg/dL   Alkaline Phosphatase 94 44 - 121 IU/L   AST 15 0 - 40 IU/L   ALT 11 0 - 32 IU/L  Hepatitis C antibody  Result Value Ref Range   Hep C Virus Ab <0.1 0.0 - 0.9 s/co ratio  Lipid Panel w/o Chol/HDL Ratio  Result Value Ref Range   Cholesterol, Total 226 (H) 100 - 199 mg/dL   Triglycerides 824 0 - 149 mg/dL   HDL 59 >23 mg/dL   VLDL Cholesterol Cal 24 5 - 40 mg/dL   LDL Chol Calc (NIH) 536 (H) 0 - 99 mg/dL  TSH  Result Value Ref Range   TSH 2.250 0.450 - 4.500 uIU/mL      Assessment & Plan:   Problem List Items Addressed This Visit       Cardiovascular and Mediastinum   Migraine    Chronic and stable.  Continue Metoprolol + Imitrex for acute episodes only.  Recommend she continue to monitor HR and notify provider if levels <60 consistently.  Will adjust Metoprolol dose as needed based on symptoms.  Return in 6 months, sooner if any worsening symptoms.  CMP today.  Relevant Orders   Comprehensive metabolic panel     Endocrine   IFG (impaired fasting glucose)    Noted on past labs, check A1c today.       Relevant Orders   HgB A1c     Other   Depression - Primary    Chronic, ongoing.  Denies SI/HI.  Continue Duloxetine as ordered, refills up to date.  Adjust doses as needed.  Return in 6 months.       Hyperlipidemia    Noted on past labs with ASCVD 0.5%.  Continue diet and exercise focus, lipid panel at physical.       Obesity    Recommended eating smaller high protein, low fat meals more frequently and exercising 30 mins a day 5 times a week with a goal of 10-15lb weight loss in the next 3 months. Patient voiced their understanding and motivation to adhere to these recommendations.        Right shoulder pain    Acute, suspect tendonitis vs impingement.  Recommend gentle stretches at home and continue Ibuprofen  as needed.  May use OTC creams like Voltaren gel or Icy/Hot.  If ongoing symptoms return to office.         Follow up plan: Return in about 6 months (around 05/31/2021) for Annual physical with pap.

## 2020-11-29 NOTE — Assessment & Plan Note (Signed)
Noted on past labs with ASCVD 0.5%.  Continue diet and exercise focus, lipid panel at physical.

## 2020-11-29 NOTE — Patient Instructions (Signed)
Migraine Headache A migraine headache is a very strong throbbing pain on one side or both sides of your head. This type of headache can also cause other symptoms. It can last from 4 hours to 3 days. Talk with your doctor about what things may bring on (trigger) this condition. What are the causes? The exact cause of this condition is not known. This condition may be triggered or caused by: Drinking alcohol. Smoking. Taking medicines, such as: Medicine used to treat chest pain (nitroglycerin). Birth control pills. Estrogen. Some blood pressure medicines. Eating or drinking certain products. Doing physical activity. Other things that may trigger a migraine headache include: Having a menstrual period. Pregnancy. Hunger. Stress. Not getting enough sleep or getting too much sleep. Weather changes. Tiredness (fatigue). What increases the risk? Being 25-55 years old. Being female. Having a family history of migraine headaches. Being Caucasian. Having depression or anxiety. Being very overweight. What are the signs or symptoms? A throbbing pain. This pain may: Happen in any area of the head, such as on one side or both sides. Make it hard to do daily activities. Get worse with physical activity. Get worse around bright lights or loud noises. Other symptoms may include: Feeling sick to your stomach (nauseous). Vomiting. Dizziness. Being sensitive to bright lights, loud noises, or smells. Before you get a migraine headache, you may get warning signs (an aura). An aura may include: Seeing flashing lights or having blind spots. Seeing bright spots, halos, or zigzag lines. Having tunnel vision or blurred vision. Having numbness or a tingling feeling. Having trouble talking. Having weak muscles. Some people have symptoms after a migraine headache (postdromal phase), such as: Tiredness. Trouble thinking (concentrating). How is this treated? Taking medicines that: Relieve  pain. Relieve the feeling of being sick to your stomach. Prevent migraine headaches. Treatment may also include: Having acupuncture. Avoiding foods that bring on migraine headaches. Learning ways to control your body functions (biofeedback). Therapy to help you know and deal with negative thoughts (cognitive behavioral therapy). Follow these instructions at home: Medicines Take over-the-counter and prescription medicines only as told by your doctor. Ask your doctor if the medicine prescribed to you: Requires you to avoid driving or using heavy machinery. Can cause trouble pooping (constipation). You may need to take these steps to prevent or treat trouble pooping: Drink enough fluid to keep your pee (urine) pale yellow. Take over-the-counter or prescription medicines. Eat foods that are high in fiber. These include beans, whole grains, and fresh fruits and vegetables. Limit foods that are high in fat and sugar. These include fried or sweet foods. Lifestyle Do not drink alcohol. Do not use any products that contain nicotine or tobacco, such as cigarettes, e-cigarettes, and chewing tobacco. If you need help quitting, ask your doctor. Get at least 8 hours of sleep every night. Limit and deal with stress. General instructions   Keep a journal to find out what may bring on your migraine headaches. For example, write down: What you eat and drink. How much sleep you get. Any change in what you eat or drink. Any change in your medicines. If you have a migraine headache: Avoid things that make your symptoms worse, such as bright lights. It may help to lie down in a dark, quiet room. Do not drive or use heavy machinery. Ask your doctor what activities are safe for you. Keep all follow-up visits as told by your doctor. This is important. Contact a doctor if: You get a migraine headache that   is different or worse than others you have had. You have more than 15 headache days in one  month. Get help right away if: Your migraine headache gets very bad. Your migraine headache lasts longer than 72 hours. You have a fever. You have a stiff neck. You have trouble seeing. Your muscles feel weak or like you cannot control them. You start to lose your balance a lot. You start to have trouble walking. You pass out (faint). You have a seizure. Summary A migraine headache is a very strong throbbing pain on one side or both sides of your head. These headaches can also cause other symptoms. This condition may be treated with medicines and changes to your lifestyle. Keep a journal to find out what may bring on your migraine headaches. Contact a doctor if you get a migraine headache that is different or worse than others you have had. Contact your doctor if you have more than 15 headache days in a month. This information is not intended to replace advice given to you by your health care provider. Make sure you discuss any questions you have with your health care provider. Document Revised: 09/30/2018 Document Reviewed: 07/21/2018 Elsevier Patient Education  2022 Elsevier Inc.  

## 2020-11-29 NOTE — Assessment & Plan Note (Signed)
Acute, suspect tendonitis vs impingement.  Recommend gentle stretches at home and continue Ibuprofen as needed.  May use OTC creams like Voltaren gel or Icy/Hot.  If ongoing symptoms return to office.

## 2020-11-29 NOTE — Assessment & Plan Note (Signed)
Noted on past labs, check A1c today. 

## 2020-11-29 NOTE — Assessment & Plan Note (Signed)
Chronic, ongoing.  Denies SI/HI.  Continue Duloxetine as ordered, refills up to date.  Adjust doses as needed.  Return in 6 months.

## 2020-11-29 NOTE — Assessment & Plan Note (Signed)
Recommended eating smaller high protein, low fat meals more frequently and exercising 30 mins a day 5 times a week with a goal of 10-15lb weight loss in the next 3 months. Patient voiced their understanding and motivation to adhere to these recommendations.  

## 2020-11-29 NOTE — Assessment & Plan Note (Addendum)
Chronic and stable.  Continue Metoprolol + Imitrex for acute episodes only.  Recommend she continue to monitor HR and notify provider if levels <60 consistently.  Will adjust Metoprolol dose as needed based on symptoms.  Return in 6 months, sooner if any worsening symptoms.  CMP today.

## 2020-11-30 LAB — COMPREHENSIVE METABOLIC PANEL
ALT: 16 IU/L (ref 0–32)
AST: 16 IU/L (ref 0–40)
Albumin/Globulin Ratio: 1.8 (ref 1.2–2.2)
Albumin: 4.4 g/dL (ref 3.8–4.8)
Alkaline Phosphatase: 76 IU/L (ref 44–121)
BUN/Creatinine Ratio: 16 (ref 9–23)
BUN: 12 mg/dL (ref 6–24)
Bilirubin Total: <0.2 mg/dL (ref 0.0–1.2)
CO2: 23 mmol/L (ref 20–29)
Calcium: 8.9 mg/dL (ref 8.7–10.2)
Chloride: 102 mmol/L (ref 96–106)
Creatinine, Ser: 0.73 mg/dL (ref 0.57–1.00)
Globulin, Total: 2.5 g/dL (ref 1.5–4.5)
Glucose: 86 mg/dL (ref 65–99)
Potassium: 4.1 mmol/L (ref 3.5–5.2)
Sodium: 140 mmol/L (ref 134–144)
Total Protein: 6.9 g/dL (ref 6.0–8.5)
eGFR: 104 mL/min/{1.73_m2} (ref >59)

## 2020-11-30 LAB — HEMOGLOBIN A1C
Est. average glucose Bld gHb Est-mCnc: 120 mg/dL
Hgb A1c MFr Bld: 5.8 % — ABNORMAL HIGH (ref 4.8–5.6)

## 2020-11-30 NOTE — Progress Notes (Signed)
Contacted via [employer] afternoon Tammy Stout, your labs have returned. Kidney function, creatinine and eGFR, and liver function, AST and ALT, are normal.  The A1C is the diabetes testing we talked about, this looks at your blood sugars over the past 3 months and turns the average into a number.  Your number is 5.8%, meaning you are prediabetic.  Any number 5.7 to 6.4 is considered prediabetes and any number 6.5 or greater is considered diabetes.   I would recommend heavy focus on decreasing foods high in sugar and your intake of things like bread products, pasta, and rice.  The American Diabetes Association online has a large amount of information on diet changes to make.  We will recheck this number in 3-6 months to ensure you are not continuing to trend upwards and move into diabetes.  Have a good day. Keep being awesome!!  Thank you for allowing me to participate in your care.  I appreciate you. Kindest regards, Jennelle Pinkstaff

## 2020-12-04 NOTE — Telephone Encounter (Signed)
error 

## 2020-12-10 ENCOUNTER — Ambulatory Visit: Payer: Managed Care, Other (non HMO) | Admitting: Podiatry

## 2020-12-18 NOTE — Telephone Encounter (Signed)
error 

## 2020-12-24 ENCOUNTER — Other Ambulatory Visit: Payer: Self-pay | Admitting: Nurse Practitioner

## 2021-01-07 ENCOUNTER — Encounter: Payer: Self-pay | Admitting: Nurse Practitioner

## 2021-01-07 ENCOUNTER — Ambulatory Visit (INDEPENDENT_AMBULATORY_CARE_PROVIDER_SITE_OTHER): Payer: Managed Care, Other (non HMO) | Admitting: Nurse Practitioner

## 2021-01-07 ENCOUNTER — Other Ambulatory Visit: Payer: Self-pay

## 2021-01-07 DIAGNOSIS — L710 Perioral dermatitis: Secondary | ICD-10-CM | POA: Diagnosis not present

## 2021-01-07 MED ORDER — METRONIDAZOLE 0.75 % EX CREA: TOPICAL_CREAM | Freq: Two times a day (BID) | CUTANEOUS | 4 refills | Status: DC

## 2021-01-07 NOTE — Patient Instructions (Signed)
Atopic Dermatitis Atopic dermatitis is a skin disorder that causes inflammation of the skin. It is marked by a red rash and itchy, dry, scaly skin. It is the most common type of eczema. Eczema is a group of skin conditions that cause the skin to become rough and swollen. This condition is generally worse during the cooler wintermonths and often improves during the warm summer months. Atopic dermatitis usually starts showing signs in infancy and can last through adulthood. This condition cannot be passed from one person to another (is not contagious). Atopic dermatitis may not always be present, but when it is, it is called aflare-up. What are the causes? The exact cause of this condition is not known. Flare-ups may be triggered by: Coming in contact with something that you are sensitive or allergic to (allergen). Stress. Certain foods. Extremely hot or cold weather. Harsh chemicals and soaps. Dry air. Chlorine. What increases the risk? This condition is more likely to develop in people who have a personal or family history of: Eczema. Allergies. Asthma. Hay fever. What are the signs or symptoms? Symptoms of this condition include: Dry, scaly skin. Red, itchy rash. Itchiness, which can be severe. This may occur before the skin rash. This can make sleeping difficult. Skin thickening and cracking that can occur over time. How is this diagnosed? This condition is diagnosed based on: Your symptoms. Your medical history. A physical exam. How is this treated? There is no cure for this condition, but symptoms can usually be controlled. Treatment focuses on: Controlling the itchiness and scratching. You may be given medicines, such as antihistamines or steroid creams. Limiting exposure to allergens. Recognizing situations that cause stress and developing a plan to manage stress. If your atopic dermatitis does not get better with medicines, or if it is all over your body (widespread), a  treatment using a specific type of light (phototherapy) may be used. Follow these instructions at home: Skin care  Keep your skin well moisturized. Doing this seals in moisture and helps to prevent dryness. Use unscented lotions that have petroleum in them. Avoid lotions that contain alcohol or water. They can dry the skin. Keep baths or showers short (less than 5 minutes) in warm water. Do not use hot water. Use mild, unscented cleansers for bathing. Avoid soap and bubble bath. Apply a moisturizer to your skin right after a bath or shower. Do not apply anything to your skin without checking with your health care provider.  General instructions Take or apply over-the-counter and prescription medicines only as told by your health care provider. Dress in clothes made of cotton or cotton blends. Dress lightly because heat increases itchiness. When washing your clothes, rinse your clothes twice so all of the soap is removed. Avoid any triggers that can cause a flare-up. Keep your fingernails cut short. Avoid scratching. Scratching makes the rash and itchiness worse. A break in the skin from scratching could result in a skin infection (impetigo). Do not be around people who have cold sores or fever blisters. If you get the infection, it may cause your atopic dermatitis to worsen. Keep all follow-up visits. This is important. Contact a health care provider if: Your itchiness interferes with sleep. Your rash gets worse or is not better within one week of starting treatment. You have a fever. You have a rash flare-up after having contact with someone who has cold sores or fever blisters. Get help right away if: You develop pus or soft yellow scabs in the rash area.   Summary Atopic dermatitis causes a red rash and itchy, dry, scaly skin. Treatment focuses on controlling the itchiness and scratching, limiting exposure to things that you are sensitive or allergic to (allergens), recognizing  situations that cause stress, and developing a plan to manage stress. Keep your skin well moisturized. Keep baths or showers shorter than 5 minutes and use warm water. Do not use hot water. This information is not intended to replace advice given to you by your health care provider. Make sure you discuss any questions you have with your healthcare provider. Document Revised: 03/18/2020 Document Reviewed: 03/18/2020 Elsevier Patient Education  2022 Elsevier Inc.  

## 2021-01-07 NOTE — Progress Notes (Signed)
BP 110/69 (BP Location: Right Arm, Cuff Size: Normal)   Pulse 73   Temp 99 F (37.2 C) (Oral)   Wt 202 lb 9.6 oz (91.9 kg)   SpO2 98%   BMI 33.35 kg/m    Subjective:    Patient ID: Tammy Stout, female    DOB: February 03, 1976, 45 y.o.   MRN: [id number]  HPI: Tammy Stout is a 45 y.o. female  Chief Complaint  Patient presents with   Rash    Patient states she has been having issues with a rash on her face for about 2 months and states it is very itchy and it is not going. Patient denies having tried any OTC medications. Patient states she has had this before and was treated with an antibiotic and and it cleared up. Patient states she was seeing Dermatology, but it has been a while.    RASH Presents to day for rash on face for 2 months.  Has had similar before, is itchy in presentation.  Has not tried any OTC medications.  Did see dermatology many years ago. Duration:  chronic  Location: face  Itching: yes Burning: no Redness: yes Oozing: no Scaling: no Blisters: no Painful: no Fevers: no Change in detergents/soaps/personal care products: no Recent illness: no Recent travel:no History of same: yes Context: fluctuating Alleviating factors: nothing Treatments attempted:nothing Shortness of breath: no  Throat/tongue swelling: no Myalgias/arthralgias: no   Relevant past medical, surgical, family and social history reviewed and updated as indicated. Interim medical history since our last visit reviewed. Allergies and medications reviewed and updated.  Review of Systems  Constitutional:  Negative for activity change, appetite change, diaphoresis, fatigue and fever.  Respiratory:  Negative for cough, chest tightness, shortness of breath and wheezing.   Cardiovascular:  Negative for chest pain, palpitations and leg swelling.  Gastrointestinal: Negative.   Skin:  Positive for rash.  Neurological: Negative.   Psychiatric/Behavioral: Negative.     Per HPI unless  specifically indicated above     Objective:    BP 110/69 (BP Location: Right Arm, Cuff Size: Normal)   Pulse 73   Temp 99 F (37.2 C) (Oral)   Wt 202 lb 9.6 oz (91.9 kg)   SpO2 98%   BMI 33.35 kg/m   Wt Readings from Last 3 Encounters:  01/07/21 202 lb 9.6 oz (91.9 kg)  11/29/20 206 lb 6.4 oz (93.6 kg)  05/03/20 201 lb 3.2 oz (91.3 kg)    Physical Exam Vitals and nursing note reviewed.  Constitutional:      General: She is awake. She is not in acute distress.    Appearance: She is well-developed and well-groomed. She is obese. She is not ill-appearing or toxic-appearing.  HENT:     Head: Normocephalic.     Right Ear: Hearing normal.     Left Ear: Hearing normal.  Eyes:     General: Lids are normal.        Right eye: No discharge.        Left eye: No discharge.     Conjunctiva/sclera: Conjunctivae normal.     Pupils: Pupils are equal, round, and reactive to light.  Neck:     Thyroid: No thyromegaly.     Vascular: No carotid bruit or JVD.  Cardiovascular:     Rate and Rhythm: Normal rate and regular rhythm.     Heart sounds: Normal heart sounds. No murmur heard.   No gallop.  Pulmonary:     Effort: Pulmonary  effort is normal. No accessory muscle usage or respiratory distress.     Breath sounds: Normal breath sounds.  Abdominal:     General: Bowel sounds are normal.     Palpations: Abdomen is soft.  Musculoskeletal:     Cervical back: Normal range of motion and neck supple.     Right lower leg: No edema.     Left lower leg: No edema.  Lymphadenopathy:     Cervical: No cervical adenopathy.  Skin:    General: Skin is warm and dry.     Findings: Rash present.       Neurological:     Mental Status: She is alert and oriented to person, place, and time.  Psychiatric:        Attention and Perception: Attention normal.        Mood and Affect: Mood normal.        Speech: Speech normal.        Behavior: Behavior normal. Behavior is cooperative.        Thought  Content: Thought content normal.    Results for orders placed or performed in visit on 11/29/20  Comprehensive metabolic panel  Result Value Ref Range   Glucose 86 65 - 99 mg/dL   BUN 12 6 - 24 mg/dL   Creatinine, Ser 0.73 0.57 - 1.00 mg/dL   eGFR 104 >59 mL/min/1.73   BUN/Creatinine Ratio 16 9 - 23   Sodium 140 134 - 144 mmol/L   Potassium 4.1 3.5 - 5.2 mmol/L   Chloride 102 96 - 106 mmol/L   CO2 23 20 - 29 mmol/L   Calcium 8.9 8.7 - 10.2 mg/dL   Total Protein 6.9 6.0 - 8.5 g/dL   Albumin 4.4 3.8 - 4.8 g/dL   Globulin, Total 2.5 1.5 - 4.5 g/dL   Albumin/Globulin Ratio 1.8 1.2 - 2.2   Bilirubin Total <0.2 0.0 - 1.2 mg/dL   Alkaline Phosphatase 76 44 - 121 IU/L   AST 16 0 - 40 IU/L   ALT 16 0 - 32 IU/L  HgB A1c  Result Value Ref Range   Hgb A1c MFr Bld 5.8 (H) 4.8 - 5.6 %   Est. average glucose Bld gHb Est-mCnc 120 mg/dL      Assessment & Plan:   Problem List Items Addressed This Visit       Digestive   Perioral dermatitis    Acute x 2 months -- with flares.  Will trial Metronidazole 0.75% cream, script sent.  Monitor area and if worsening or ongoing return to office.         Follow up plan: Return if symptoms worsen or fail to improve.  

## 2021-01-07 NOTE — Assessment & Plan Note (Signed)
Acute x 2 months -- with flares.  Will trial Metronidazole 0.75% cream, script sent.  Monitor area and if worsening or ongoing return to office.

## 2021-01-28 ENCOUNTER — Other Ambulatory Visit: Payer: Self-pay | Admitting: Nurse Practitioner

## 2021-02-27 ENCOUNTER — Other Ambulatory Visit: Payer: Self-pay | Admitting: Nurse Practitioner

## 2021-03-06 ENCOUNTER — Other Ambulatory Visit: Payer: Self-pay | Admitting: Nurse Practitioner

## 2021-04-07 ENCOUNTER — Other Ambulatory Visit: Payer: Self-pay | Admitting: Nurse Practitioner

## 2021-06-02 ENCOUNTER — Encounter: Payer: Managed Care, Other (non HMO) | Admitting: Nurse Practitioner

## 2021-06-02 ENCOUNTER — Other Ambulatory Visit: Payer: Self-pay | Admitting: Nurse Practitioner

## 2021-06-02 DIAGNOSIS — E559 Vitamin D deficiency, unspecified: Secondary | ICD-10-CM

## 2021-06-02 DIAGNOSIS — Z0000 Encounter for general adult medical examination without abnormal findings: Secondary | ICD-10-CM

## 2021-06-02 DIAGNOSIS — E6609 Other obesity due to excess calories: Secondary | ICD-10-CM

## 2021-06-02 DIAGNOSIS — F325 Major depressive disorder, single episode, in full remission: Secondary | ICD-10-CM

## 2021-06-02 DIAGNOSIS — R7301 Impaired fasting glucose: Secondary | ICD-10-CM

## 2021-06-02 DIAGNOSIS — E782 Mixed hyperlipidemia: Secondary | ICD-10-CM

## 2021-06-02 DIAGNOSIS — G43829 Menstrual migraine, not intractable, without status migrainosus: Secondary | ICD-10-CM

## 2021-06-02 DIAGNOSIS — Z124 Encounter for screening for malignant neoplasm of cervix: Secondary | ICD-10-CM

## 2021-06-02 DIAGNOSIS — G2581 Restless legs syndrome: Secondary | ICD-10-CM

## 2021-06-02 NOTE — Telephone Encounter (Signed)
Requested Prescriptions  Pending Prescriptions Disp Refills  . DULoxetine (CYMBALTA) 60 MG capsule [Pharmacy Med Name: DULOXETINE HCL 60 MG CAP] 90 capsule 0    Sig: TAKE (1) CAPSULE BY MOUTH EVERY DAY     Psychiatry: Antidepressants - SNRI Passed - 06/02/2021  8:35 AM      Passed - Completed PHQ-2 or PHQ-9 in the last 360 days      Passed - Last BP in normal range    BP Readings from Last 1 Encounters:  01/07/21 110/69         Passed - Valid encounter within last 6 months    Recent Outpatient Visits          4 months ago Perioral dermatitis   [facility] [place], Sterling T, NP   6 months ago Major depressive disorder with single episode, in full remission (HCC)   [facility] [place], Alva T, NP   1 year ago Major depressive disorder with single episode, in full remission (HCC)   [facility] [place], [place] T, NP   1 year ago Menstrual migraine without status migrainosus, not intractable   [facility] [place], [place]y T, NP   1 year ago Menstrual migraine without status migrainosus, not intractable   [facility] [place], Dorie Rank, NP      Future Appointments            Tomorrow Marjie Skiff, NP [facility]

## 2021-06-03 ENCOUNTER — Encounter: Payer: Self-pay | Admitting: Nurse Practitioner

## 2021-06-03 ENCOUNTER — Other Ambulatory Visit (HOSPITAL_COMMUNITY)
Admission: RE | Admit: 2021-06-03 | Discharge: 2021-06-03 | Disposition: A | Discharge status: Home / Self Care | Payer: Managed Care, Other (non HMO) | Source: Ambulatory Visit | Attending: Nurse Practitioner | Admitting: Nurse Practitioner

## 2021-06-03 ENCOUNTER — Ambulatory Visit (INDEPENDENT_AMBULATORY_CARE_PROVIDER_SITE_OTHER): Payer: Managed Care, Other (non HMO) | Admitting: Nurse Practitioner

## 2021-06-03 ENCOUNTER — Other Ambulatory Visit: Payer: Self-pay

## 2021-06-03 VITALS — BP 108/71 | HR 78 | Temp 98.1°F | Ht 65.75 in | Wt 201.4 lb

## 2021-06-03 DIAGNOSIS — G43829 Menstrual migraine, not intractable, without status migrainosus: Secondary | ICD-10-CM

## 2021-06-03 DIAGNOSIS — Z124 Encounter for screening for malignant neoplasm of cervix: Secondary | ICD-10-CM | POA: Insufficient documentation

## 2021-06-03 DIAGNOSIS — F325 Major depressive disorder, single episode, in full remission: Secondary | ICD-10-CM

## 2021-06-03 DIAGNOSIS — R7301 Impaired fasting glucose: Secondary | ICD-10-CM | POA: Diagnosis not present

## 2021-06-03 DIAGNOSIS — Z0000 Encounter for general adult medical examination without abnormal findings: Secondary | ICD-10-CM

## 2021-06-03 DIAGNOSIS — Z23 Encounter for immunization: Secondary | ICD-10-CM | POA: Diagnosis not present

## 2021-06-03 DIAGNOSIS — E782 Mixed hyperlipidemia: Secondary | ICD-10-CM

## 2021-06-03 DIAGNOSIS — Z1231 Encounter for screening mammogram for malignant neoplasm of breast: Secondary | ICD-10-CM | POA: Diagnosis not present

## 2021-06-03 DIAGNOSIS — E559 Vitamin D deficiency, unspecified: Secondary | ICD-10-CM

## 2021-06-03 DIAGNOSIS — Z6833 Body mass index (BMI) 33.0-33.9, adult: Secondary | ICD-10-CM

## 2021-06-03 DIAGNOSIS — Z862 Personal history of diseases of the blood and blood-forming organs and certain disorders involving the immune mechanism: Secondary | ICD-10-CM

## 2021-06-03 DIAGNOSIS — E6609 Other obesity due to excess calories: Secondary | ICD-10-CM

## 2021-06-03 DIAGNOSIS — Z803 Family history of malignant neoplasm of breast: Secondary | ICD-10-CM

## 2021-06-03 MED ORDER — METOPROLOL TARTRATE 25 MG PO TABS: ORAL_TABLET | ORAL | 4 refills | Status: DC

## 2021-06-03 MED ORDER — SUMATRIPTAN SUCCINATE 50 MG PO TABS: ORAL_TABLET | ORAL | 12 refills | Status: DC

## 2021-06-03 MED ORDER — DULOXETINE HCL 60 MG PO CPEP: ORAL_CAPSULE | ORAL | 4 refills | Status: DC

## 2021-06-03 NOTE — Assessment & Plan Note (Addendum)
Noted on past labs with ASCVD 0.6%.  Continue diet and exercise focus, lipid panel at today.

## 2021-06-03 NOTE — Patient Instructions (Signed)
[facility] # = [phone]  Mammogram A mammogram is an X-ray of the breasts. This is done to check for changes that are not normal. This test can look for changes that may be caused by breast cancer or other problems. Mammograms are regularly done on women beginning at age 44. A man may have a mammogram if he has a lump or swelling in his breast. Tell a doctor: About any allergies you have. If you have breast implants. If you have had breast disease, biopsy, or surgery. If you have a family history of breast cancer. If you are breastfeeding. Whether you are pregnant or may be pregnant. What are the risks? Generally, this is a safe procedure. But problems may occur, including: Being exposed to radiation. Radiation levels are very low with this test. The need for more tests. The results were not read properly. Trouble finding breast cancer in women with dense breasts. What happens before the test? Have this test done about 1-2 weeks after your menstrual period. This is often when your breasts are the least tender. If you are visiting a new doctor or clinic, have any past mammogram images sent to your new doctor's office. Wash your breasts and under your arms on the day of the test. Do not use deodorants, perfumes, lotions, or powders on the day of the test. Take off any jewelry from your neck. Wear clothes that you can change into and out of easily. What happens during the test?  You will take off your clothes from the waist up. You will put on a gown. You will stand in front of the X-ray machine. Each breast will be placed between two plastic or glass plates. The plates will press down on your breast for a few seconds. Try to relax. This does not cause any harm to your breasts. It may not feel comfortable, but it will be very brief. X-rays will be taken from different angles of each breast. The procedure may vary among doctors and hospitals. What can I expect after the  test? The mammogram will be read by a specialist (radiologist). You may need to do parts of the test again. This depends on the quality of the images. You may go back to your normal activities. It is up to you to get the results of your test. Ask how to get your results when they are ready. Summary A mammogram is an X-ray of the breasts. It looks for changes that may be caused by breast cancer or other problems. A man may have this test if he has a lump or swelling in his breast. Before the test, tell your doctor about any breast problems that you have had in the past. Have this test done about 1-2 weeks after your menstrual period. Ask when your test results will be ready. Make sure you get your test results. This information is not intended to replace advice given to you by your health care provider. Make sure you discuss any questions you have with your health care provider. Document Revised: 02/19/2021 Document Reviewed: 04/08/2020 Elsevier Patient Education  2022 [employer].

## 2021-06-03 NOTE — Progress Notes (Signed)
BP 108/71    Pulse 78    Temp 98.1 F (36.7 C)    Ht 5' 5.75" (1.67 m)    Wt 201 lb 6.4 oz (91.4 kg)    SpO2 97%    BMI 32.75 kg/m    Subjective:    Patient ID: Tammy Stout, female    DOB: [birth year], 45 y.o.   MRN: [id number]  HPI: Tammy Stout is a 45 y.o. female presenting on 06/03/2021 for comprehensive medical examination. Current medical complaints include:none  She currently lives with: husband Menopausal Symptoms: no   MIGRAINES Continues on Metoprolol, Imitrex and Duloxetine, this is for mood and migraines. Notices migraines 3 days a month, no missed work days.  Duration: chronic Headache status at time of visit: asymptomatic Treatments attempted: aleve, excedrin and triptans   Aura: no Nausea:  yes Vomiting: no Photophobia:  yes Phonophobia:  yes Effect on social functioning:  yes Numbers of missed days of school/work each month: none Confusion:  no Gait disturbance/ataxia:  no Behavioral changes:  no Fevers:  no  The 10-year ASCVD risk score (Arnett DK, et al., 2019) is: 0.6%   Values used to calculate the score:     Age: 45 years     Sex: Female     Is Non-Hispanic African American: No     Diabetic: No     Tobacco smoker: No     Systolic Blood Pressure: 166 mmHg     Is BP treated: No     HDL Cholesterol: 59 mg/dL     Total Cholesterol: 226 mg/dL    Depression Screen done today and results listed below:  Depression screen [facility] 2/9 06/03/2021 11/29/2020 05/03/2020 10/17/2019 08/02/2018  Decreased Interest 0 0 0 0 0  Down, Depressed, Hopeless 0 0 0 0 0  PHQ - 2 Score 0 0 0 0 0  Altered sleeping 0 0 0 0 2  Tired, decreased energy 1 0 0 0 0  Change in appetite 1 0 0 0 0  Feeling bad or failure about yourself  0 0 0 0 0  Trouble concentrating 0 0 0 0 0  Moving slowly or fidgety/restless 0 0 0 0 0  Suicidal thoughts 0 0 0 0 0  PHQ-9 Score 2 0 0 0 2  Difficult doing work/chores Not difficult at all Not difficult at all Not difficult at all Not  difficult at all Not difficult at all    Fall Risk 08/02/2018 11/08/2018 05/03/2020 11/29/2020 06/03/2021  Falls in the past year? 0 - 0 0 0  Was there an injury with Fall? 0 - 0 0 0  Fall Risk Category Calculator 0 - 0 0 0  Fall Risk Category Low - Low Low Low  Patient Fall Risk Level Low fall risk Low fall risk Low fall risk - Low fall risk  Patient at Risk for Falls Due to - - No Fall Risks No Fall Risks No Fall Risks  Fall risk Follow up - - Falls evaluation completed Falls evaluation completed Falls evaluation completed    Functional Status Survey: Is the patient deaf or have difficulty hearing?: No Does the patient have difficulty seeing, even when wearing glasses/contacts?: No Does the patient have difficulty concentrating, remembering, or making decisions?: No Does the patient have difficulty walking or climbing stairs?: No Does the patient have difficulty dressing or bathing?: No Does the patient have difficulty doing errands alone such as visiting a doctor's office or shopping?: No  Past Medical History:  Past Medical History:  Diagnosis Date   Depression    Dermatitis 2012   Eczema    Perioral dermatitis    Tendonitis    right hand    Surgical History:  Past Surgical History:  Procedure Laterality Date   CESAREAN SECTION  05/2006   SECTION DUE TO NO [place] AND CORD WAS AROUND BABY NECK   FOOT TENDON SURGERY  06/03/2019   Plantar fascitis    Medications:  Current Outpatient Medications on File Prior to Visit  Medication Sig   cetirizine (ZYRTEC) 10 MG tablet Take 10 mg by mouth daily.   ibuprofen (ADVIL) 800 MG tablet Take 1 tablet (800 mg total) by mouth 3 (three) times daily.   metroNIDAZOLE (METROCREAM) 0.75 % cream Apply topically 2 (two) times daily.   rOPINIRole (REQUIP) 0.25 MG tablet TAKE ONE TABLET BY MOUTH AT BEDTIME.   No current facility-administered medications on file prior to visit.    Allergies:  No Known Allergies  Social History:   Social History   Socioeconomic History   Marital status: Married    Spouse name: Not on file   Number of children: Not on file   Years of education: Not on file   Highest education level: Not on file  Occupational History   Not on file  Tobacco Use   Smoking status: Never   Smokeless tobacco: Never  Vaping Use   Vaping Use: Never used  Substance and Sexual Activity   Alcohol use: Yes    Comment: social   Drug use: No   Sexual activity: Yes  Other Topics Concern   Not on file  Social History Narrative   Not on file   Social Determinants of Health   Financial Resource Strain: Low Risk    Difficulty of Paying Living Expenses: Not hard at all  Food Insecurity: No Food Insecurity   Worried About Charity fundraiser in the Last Year: Never true   [place] in the Last Year: Never true  Transportation Needs: No Transportation Needs   Lack of Transportation (Medical): No   Lack of Transportation (Non-Medical): No  Physical Activity: Sufficiently Active   Days of Exercise per Week: 4 days   Minutes of Exercise per Session: 40 min  Stress: No Stress Concern Present   Feeling of Stress : Only a little  Social Connections: Moderately Isolated   Frequency of Communication with Friends and Family: More than three times a week   Frequency of Social Gatherings with Friends and Family: More than three times a week   Attends Religious Services: Never   Marine scientist or Organizations: No   Attends Music therapist: Never   Marital Status: Married  Human resources officer Violence: Not At Risk   Fear of Current or Ex-Partner: No   Emotionally Abused: No   Physically Abused: No   Sexually Abused: No   Social History   Tobacco Use  Smoking Status Never  Smokeless Tobacco Never   Social History   Substance and Sexual Activity  Alcohol Use Yes   Comment: social    Family History:  Family History  Problem Relation Age of Onset   Cancer Paternal  Grandmother 58       kidney cancer   Cancer Maternal Grandmother 80       lung cancer   Diabetes Maternal Grandmother    Diabetes Mother    Cancer Mother 85  One breast   Thyroid disease Father    Hyperlipidemia Father    Diabetes Maternal Grandfather    Thyroid disease Maternal Aunt     Past medical history, surgical history, medications, allergies, family history and social history reviewed with patient today and changes made to appropriate areas of the chart.   Review of Systems - negative All other ROS negative except what is listed above and in the HPI.      Objective:    BP 108/71    Pulse 78    Temp 98.1 F (36.7 C)    Ht 5' 5.75" (1.67 m)    Wt 201 lb 6.4 oz (91.4 kg)    SpO2 97%    BMI 32.75 kg/m   Wt Readings from Last 3 Encounters:  06/03/21 201 lb 6.4 oz (91.4 kg)  01/07/21 202 lb 9.6 oz (91.9 kg)  11/29/20 206 lb 6.4 oz (93.6 kg)    Physical Exam Vitals and nursing note reviewed. Exam conducted with a chaperone present.  Constitutional:      General: She is awake. She is not in acute distress.    Appearance: She is well-developed and well-groomed. She is obese. She is not ill-appearing.  HENT:     Head: Normocephalic and atraumatic.     Right Ear: Hearing, tympanic membrane, ear canal and external ear normal. No drainage.     Left Ear: Hearing, tympanic membrane, ear canal and external ear normal. No drainage.     Nose: Nose normal.     Right Sinus: No maxillary sinus tenderness or frontal sinus tenderness.     Left Sinus: No maxillary sinus tenderness or frontal sinus tenderness.     Mouth/Throat:     Mouth: Mucous membranes are moist.     Pharynx: Oropharynx is clear. Uvula midline. No pharyngeal swelling, oropharyngeal exudate or posterior oropharyngeal erythema.  Eyes:     General: Lids are normal.        Right eye: No discharge.        Left eye: No discharge.     Extraocular Movements: Extraocular movements intact.     Conjunctiva/sclera:  Conjunctivae normal.     Pupils: Pupils are equal, round, and reactive to light.     Visual Fields: Right eye visual fields normal and left eye visual fields normal.  Neck:     Thyroid: No thyromegaly.     Vascular: No carotid bruit.     Trachea: Trachea normal.  Cardiovascular:     Rate and Rhythm: Normal rate and regular rhythm.     Heart sounds: Normal heart sounds. No murmur heard.   No gallop.  Pulmonary:     Effort: Pulmonary effort is normal. No accessory muscle usage or respiratory distress.     Breath sounds: Normal breath sounds.  Chest:  Breasts:    Right: Normal.     Left: Normal.  Abdominal:     General: Bowel sounds are normal.     Palpations: Abdomen is soft. There is no hepatomegaly or splenomegaly.     Tenderness: There is no abdominal tenderness.     Hernia: There is no hernia in the left inguinal area or right inguinal area.  Genitourinary:    Exam position: Lithotomy position.     Labia:        Right: No rash.      Vagina: Normal.     Cervix: Normal.     Uterus: Normal.      Adnexa: Right adnexa normal  and left adnexa normal.     Comments: Cervix anterior and viewed, pap obtained. Musculoskeletal:        General: Normal range of motion.     Cervical back: Normal range of motion and neck supple.     Right lower leg: No edema.     Left lower leg: No edema.  Lymphadenopathy:     Head:     Right side of head: No submental, submandibular, tonsillar, preauricular or posterior auricular adenopathy.     Left side of head: No submental, submandibular, tonsillar, preauricular or posterior auricular adenopathy.     Cervical: No cervical adenopathy.     Upper Body:     Right upper body: No supraclavicular, axillary or pectoral adenopathy.     Left upper body: No supraclavicular, axillary or pectoral adenopathy.  Skin:    General: Skin is warm and dry.     Capillary Refill: Capillary refill takes less than 2 seconds.     Findings: No rash.  Neurological:      Mental Status: She is alert and oriented to person, place, and time.     Gait: Gait is intact.     Deep Tendon Reflexes: Reflexes are normal and symmetric.     Reflex Scores:      Brachioradialis reflexes are 2+ on the right side and 2+ on the left side.      Patellar reflexes are 2+ on the right side and 2+ on the left side. Psychiatric:        Attention and Perception: Attention normal.        Mood and Affect: Mood normal.        Speech: Speech normal.        Behavior: Behavior normal. Behavior is cooperative.        Thought Content: Thought content normal.        Judgment: Judgment normal.    Results for orders placed or performed in visit on 11/29/20  Comprehensive metabolic panel  Result Value Ref Range   Glucose 86 65 - 99 mg/dL   BUN 12 6 - 24 mg/dL   Creatinine, Ser 0.73 0.57 - 1.00 mg/dL   eGFR 104 >59 mL/min/1.73   BUN/Creatinine Ratio 16 9 - 23   Sodium 140 134 - 144 mmol/L   Potassium 4.1 3.5 - 5.2 mmol/L   Chloride 102 96 - 106 mmol/L   CO2 23 20 - 29 mmol/L   Calcium 8.9 8.7 - 10.2 mg/dL   Total Protein 6.9 6.0 - 8.5 g/dL   Albumin 4.4 3.8 - 4.8 g/dL   Globulin, Total 2.5 1.5 - 4.5 g/dL   Albumin/Globulin Ratio 1.8 1.2 - 2.2   Bilirubin Total <0.2 0.0 - 1.2 mg/dL   Alkaline Phosphatase 76 44 - 121 IU/L   AST 16 0 - 40 IU/L   ALT 16 0 - 32 IU/L  HgB A1c  Result Value Ref Range   Hgb A1c MFr Bld 5.8 (H) 4.8 - 5.6 %   Est. average glucose Bld gHb Est-mCnc 120 mg/dL      Assessment & Plan:   Problem List Items Addressed This Visit       Cardiovascular and Mediastinum   Migraine    Chronic and stable.  Continue Metoprolol + Imitrex for acute episodes only.  Recommend she continue to monitor HR and notify provider if levels <60 consistently.  Will adjust Metoprolol dose as needed based on symptoms.  Return in 12 months, sooner if any worsening symptoms.  CMP and CBC + TSH today.      Relevant Medications   DULoxetine (CYMBALTA) 60 MG capsule   SUMAtriptan  (IMITREX) 50 MG tablet   metoprolol tartrate (LOPRESSOR) 25 MG tablet   Other Relevant Orders   TSH     Endocrine   IFG (impaired fasting glucose)    Noted on past labs, check A1c today and urine ALB.      Relevant Orders   HgB A1c     Other   Depression - Primary    Chronic, ongoing.  Denies SI/HI.  Continue Duloxetine as ordered, refills up to date.  Adjust doses as needed.  Return in 12 months.      Relevant Medications   DULoxetine (CYMBALTA) 60 MG capsule   Family history of breast cancer in female    Recommend she obtain mammogram, order placed.      History of iron deficiency anemia    History of levels being low -- iron, CBC, ferritin, and B12 today.      Relevant Orders   CBC with Differential/Platelet   Iron and TIBC   Ferritin   Vitamin B12   Hyperlipidemia    Noted on past labs with ASCVD 0.6%.  Continue diet and exercise focus, lipid panel at today.      Relevant Medications   metoprolol tartrate (LOPRESSOR) 25 MG tablet   Other Relevant Orders   Comprehensive metabolic panel   Lipid Panel w/o Chol/HDL Ratio   Obesity    BMI 32.75.  Recommended eating smaller high protein, low fat meals more frequently and exercising 30 mins a day 5 times a week with a goal of 10-15lb weight loss in the next 3 months. Patient voiced their understanding and motivation to adhere to these recommendations.       Other Visit Diagnoses     Vitamin D deficiency       History of low levels, check today and start supplement as needed.   Relevant Orders   VITAMIN D 25 Hydroxy (Vit-D Deficiency, Fractures)   Flu vaccine need       Flu vaccine today.   Relevant Orders   Flu Vaccine QUAD 6+ mos PF IM (Fluarix Quad PF)   Encounter for screening mammogram for malignant neoplasm of breast       Mammogram ordered today and provided number to schedule.   Relevant Orders   MM 3D SCREEN BREAST BILATERAL   Cervical cancer screening       Pap testing performed today and sent to  lab.   Relevant Orders   Cytology - PAP   Encounter for annual physical exam       Annual physical today with labs and health maintenance reviewed.        Follow up plan: Return in about 1 year (around 06/03/2022) for Annual physical.   LABORATORY TESTING:  - Pap smear: performed today  IMMUNIZATIONS:   - Tdap: Tetanus vaccination status reviewed: last tetanus booster within 10 years. - Influenza: Up to date - Pneumovax: Not applicable - Prevnar: Not applicable - HPV: Not applicable - Zostavax vaccine: Not applicable - COVID == up to date  SCREENING: -Mammogram: Ordered today - Colonoscopy: Not applicable  - Bone Density: Not applicable  -Hearing Test: Not applicable  -Spirometry: Not applicable   PATIENT COUNSELING:   Advised to take 1 mg of folate supplement per day if capable of pregnancy.   Sexuality: Discussed sexually transmitted diseases, partner selection, use of condoms, avoidance of unintended  pregnancy  and contraceptive alternatives.   Advised to avoid cigarette smoking.  I discussed with the patient that most people either abstain from alcohol or drink within safe limits (<=14/week and <=4 drinks/occasion for males, <=7/weeks and <= 3 drinks/occasion for females) and that the risk for alcohol disorders and other health effects rises proportionally with the number of drinks per week and how often a drinker exceeds daily limits.  Discussed cessation/primary prevention of drug use and availability of treatment for abuse.   Diet: Encouraged to adjust caloric intake to maintain  or achieve ideal body weight, to reduce intake of dietary saturated fat and total fat, to limit sodium intake by avoiding high sodium foods and not adding table salt, and to maintain adequate dietary potassium and calcium preferably from fresh fruits, vegetables, and low-fat dairy products.    Stressed the importance of regular exercise  Injury prevention: Discussed safety belts, safety  helmets, smoke detector, smoking near bedding or upholstery.   Dental health: Discussed importance of regular tooth brushing, flossing, and dental visits.    NEXT PREVENTATIVE PHYSICAL DUE IN 1 YEAR. Return in about 1 year (around 06/03/2022) for Annual physical.

## 2021-06-03 NOTE — Assessment & Plan Note (Signed)
Recommend she obtain mammogram, order placed.

## 2021-06-03 NOTE — Assessment & Plan Note (Signed)
History of levels being low -- iron, CBC, ferritin, and B12 today.

## 2021-06-03 NOTE — Assessment & Plan Note (Signed)
Chronic, ongoing.  Denies SI/HI.  Continue Duloxetine as ordered, refills up to date.  Adjust doses as needed.  Return in 12 months. 

## 2021-06-03 NOTE — Assessment & Plan Note (Addendum)
Chronic and stable.  Continue Metoprolol + Imitrex for acute episodes only.  Recommend she continue to monitor HR and notify provider if levels <60 consistently.  Will adjust Metoprolol dose as needed based on symptoms.  Return in 12 months, sooner if any worsening symptoms.  CMP and CBC + TSH today.

## 2021-06-03 NOTE — Assessment & Plan Note (Signed)
Noted on past labs, check A1c today and urine ALB.

## 2021-06-03 NOTE — Assessment & Plan Note (Signed)
BMI 32.75.  Recommended eating smaller high protein, low fat meals more frequently and exercising 30 mins a day 5 times a week with a goal of 10-15lb weight loss in the next 3 months. Patient voiced their understanding and motivation to adhere to these recommendations. 

## 2021-06-04 LAB — COMPREHENSIVE METABOLIC PANEL
ALT: 18 IU/L (ref 0–32)
AST: 18 IU/L (ref 0–40)
Albumin/Globulin Ratio: 1.9 (ref 1.2–2.2)
Albumin: 4.5 g/dL (ref 3.8–4.8)
Alkaline Phosphatase: 82 IU/L (ref 44–121)
BUN/Creatinine Ratio: 19 (ref 9–23)
BUN: 13 mg/dL (ref 6–24)
Bilirubin Total: <0.2 mg/dL (ref 0.0–1.2)
CO2: 25 mmol/L (ref 20–29)
Calcium: 8.6 mg/dL — ABNORMAL LOW (ref 8.7–10.2)
Chloride: 103 mmol/L (ref 96–106)
Creatinine, Ser: 0.69 mg/dL (ref 0.57–1.00)
Globulin, Total: 2.4 g/dL (ref 1.5–4.5)
Glucose: 92 mg/dL (ref 70–99)
Potassium: 3.8 mmol/L (ref 3.5–5.2)
Sodium: 141 mmol/L (ref 134–144)
Total Protein: 6.9 g/dL (ref 6.0–8.5)
eGFR: 110 mL/min/{1.73_m2} (ref >59)

## 2021-06-04 LAB — CBC WITH DIFFERENTIAL/PLATELET
Basophils Absolute: 0 10*3/uL (ref 0.0–0.2)
Basos: 1 %
EOS (ABSOLUTE): 0.4 10*3/uL (ref 0.0–0.4)
Eos: 5 %
Hematocrit: 33.7 % — ABNORMAL LOW (ref 34.0–46.6)
Hemoglobin: 11.3 g/dL (ref 11.1–15.9)
Immature Grans (Abs): 0 10*3/uL (ref 0.0–0.1)
Immature Granulocytes: 0 %
Lymphocytes Absolute: 1.8 10*3/uL (ref 0.7–3.1)
Lymphs: 25 %
MCH: 29.7 pg (ref 26.6–33.0)
MCHC: 33.5 g/dL (ref 31.5–35.7)
MCV: 89 fL (ref 79–97)
Monocytes Absolute: 0.7 10*3/uL (ref 0.1–0.9)
Monocytes: 9 %
Neutrophils Absolute: 4.2 10*3/uL (ref 1.4–7.0)
Neutrophils: 60 %
Platelets: 263 10*3/uL (ref 150–450)
RBC: 3.8 x10E6/uL (ref 3.77–5.28)
RDW: 12.5 % (ref 11.7–15.4)
WBC: 7.1 10*3/uL (ref 3.4–10.8)

## 2021-06-04 LAB — IRON AND TIBC
Iron Saturation: 19 % (ref 15–55)
Iron: 64 ug/dL (ref 27–159)
Total Iron Binding Capacity: 343 ug/dL (ref 250–450)
UIBC: 279 ug/dL (ref 131–425)

## 2021-06-04 LAB — LIPID PANEL W/O CHOL/HDL RATIO
Cholesterol, Total: 223 mg/dL — ABNORMAL HIGH (ref 100–199)
HDL: 63 mg/dL (ref >39)
LDL Chol Calc (NIH): 134 mg/dL — ABNORMAL HIGH (ref 0–99)
Triglycerides: 149 mg/dL (ref 0–149)
VLDL Cholesterol Cal: 26 mg/dL (ref 5–40)

## 2021-06-04 LAB — HEMOGLOBIN A1C
Est. average glucose Bld gHb Est-mCnc: 123 mg/dL
Hgb A1c MFr Bld: 5.9 % — ABNORMAL HIGH (ref 4.8–5.6)

## 2021-06-04 LAB — FERRITIN: Ferritin: 40 ng/mL (ref 15–150)

## 2021-06-04 LAB — TSH: TSH: 2.77 u[IU]/mL (ref 0.450–4.500)

## 2021-06-04 LAB — VITAMIN B12: Vitamin B-12: 472 pg/mL (ref 232–1245)

## 2021-06-04 LAB — VITAMIN D 25 HYDROXY (VIT D DEFICIENCY, FRACTURES): Vit D, 25-Hydroxy: 31.7 ng/mL (ref 30.0–100.0)

## 2021-06-04 NOTE — Progress Notes (Signed)
Contacted via MyChart The 10-year ASCVD risk score (Arnett DK, et al., 2019) is: 0.5%   Values used to calculate the score:     Age: 45 years     Sex: Female     Is Non-Hispanic African American: No     Diabetic: No     Tobacco smoker: No     Systolic Blood Pressure: 108 mmHg     Is BP treated: No     HDL Cholesterol: 63 mg/dL     Total Cholesterol: 223 mg/dL   Good afternoon Tammy Stout, your labs have returned and overall remain stable with exception of mildly low calcium, remember to get lots of calcium in diet for bone health.   - The A1C is the diabetes testing we talked about, this looks at your blood sugars over the past 3 months and turns the average into a number.  Your number is 5.9%, meaning you are prediabetic.  Any number 5.7 to 6.4 is considered prediabetes and any number 6.5 or greater is considered diabetes.   I would recommend heavy focus on decreasing foods high in sugar and your intake of things like bread products, pasta, and rice.  The American Diabetes Association online has a large amount of information on diet changes to make.  We will recheck this number in 6-12 months to ensure you are not continuing to trend upwards and move into diabetes.   - Your cholesterol is still high, but continued recommendations to make lifestyle changes. Your LDL is above normal. The LDL is the bad cholesterol. Over time and in combination with inflammation and other factors, this contributes to plaque which in turn may lead to stroke and/or heart attack down the road. Sometimes high LDL is primarily genetic, and people might be eating all the right foods but still have high numbers. Other times, there is room for improvement in one's diet and eating healthier can bring this number down and potentially reduce one's risk of heart attack and/or stroke.   To reduce your LDL, Remember - more fruits and vegetables, more fish, and limit red meat and dairy products. More soy, nuts, beans, barley,  lentils, oats and plant sterol ester enriched margarine instead of butter. I also encourage eliminating sugar and processed food. Remember, shop on the outside of the grocery store and visit your [employer]. If you would like to talk with me about dietary changes plus or minus medications for your cholesterol, please let me know. We should recheck your cholesterol in 12 months.  Any questions for me? Keep being amazing!!  Thank you for allowing me to participate in your care.  I appreciate you. Kindest regards, Larina Lieurance

## 2021-06-06 LAB — CYTOLOGY - PAP
Comment: NEGATIVE
Diagnosis: NEGATIVE
High risk HPV: NEGATIVE

## 2021-06-06 NOTE — Progress Notes (Signed)
Contacted via MyChart   Good morning Tammy Stout, good news, your pap returned all negative!!  We can repeat in 5 years:)

## 2021-10-03 ENCOUNTER — Ambulatory Visit (INDEPENDENT_AMBULATORY_CARE_PROVIDER_SITE_OTHER): Payer: BC Managed Care – PPO | Admitting: Physician Assistant

## 2021-10-03 ENCOUNTER — Encounter: Payer: Self-pay | Admitting: Physician Assistant

## 2021-10-03 VITALS — BP 120/73 | HR 97 | Temp 98.7°F | Wt 196.0 lb

## 2021-10-03 DIAGNOSIS — H1132 Conjunctival hemorrhage, left eye: Secondary | ICD-10-CM

## 2021-10-03 NOTE — Patient Instructions (Addendum)
If needed you can use a lubricating eye drop such as Blink or Rephresh eye drops ?Try not to rub or touch your eyes too much to avoid further trauma to the area ?If you like you can use a cool wash cloth as a compress for relief ? ?Follow up if you have any concerns for worsening symptoms ? ?

## 2021-10-03 NOTE — Progress Notes (Signed)
? ? ?  ?    Acute Office Visit ? ? ?Patient: Tammy Stout   DOB: 07/22/1975   46 y.o. Female  MRN: [id number] ?Visit Date: 10/03/2021 ? ?Today's healthcare provider: Oswaldo Conroy Reade Trefz, PA-C  ?Introduced myself to the patient as a Secondary school teacher and provided education on APPs in clinical practice.  ? ? ?Chief Complaint  ?Patient presents with  ? Eye Problem  ?  L eye severe redness, ?broken blood vessel. Recent migraine yesterday, worse last night .  Pt is concerned that she has gotten broken blood vessels back to back. She states that has never happened before.   ? ?Subjective  ?  ?Eye Problem  ?Associated symptoms include eye redness and nausea (with migraine). Pertinent negatives include no eye discharge or photophobia.  ?HPI   ? ? Eye Problem   ? Additional comments: L eye severe redness, ?broken blood vessel. Recent migraine yesterday, worse last night .  Pt is concerned that she has gotten broken blood vessels back to back. She states that has never happened before.  ? ?  ?  ?Last edited by Junius Finner, Tammy Stout, CMA on 10/03/2021  2:10 PM.  ?  ?  ?States she had a broken blood vessel in the right eye 2 days ago ?Reports she had a bad migraine yesterday and the night before  ?Last night the left eye developed a hemorrhage and she was concerned due to presence of migraine and back to back hemorrhages ? ? ?Denies vision acuity changes, blind spots, blurry vision,  ?Reports pain behind her eye from migraines but no pain in eye itself.  ?Reports mildly increased eye drainage - clear in nature  ? ? ? ?Medications: ?Outpatient Medications Prior to Visit  ?Medication Sig  ? cetirizine (ZYRTEC) 10 MG tablet Take 10 mg by mouth daily.  ? DULoxetine (CYMBALTA) 60 MG capsule TAKE (1) CAPSULE BY MOUTH EVERY DAY  ? metoprolol tartrate (LOPRESSOR) 25 MG tablet TAKE (1) TABLET BY MOUTH TWICE DAILY  ? rOPINIRole (REQUIP) 0.25 MG tablet TAKE ONE TABLET BY MOUTH AT BEDTIME.  ? SUMAtriptan (IMITREX) 50 MG tablet TAKE 1 TABLET ONCE AS  NEEDED FOR MIGRAINE FOR 1 DOSE...  MAY REPEAT A SECOND AFTER 2 HOURS IF NEEDED.Marland Kitchen.  ? ibuprofen (ADVIL) 800 MG tablet Take 1 tablet (800 mg total) by mouth 3 (three) times daily. (Patient not taking: Reported on 10/03/2021)  ? metroNIDAZOLE (METROCREAM) 0.75 % cream Apply topically 2 (two) times daily. (Patient not taking: Reported on 10/03/2021)  ? ?No facility-administered medications prior to visit.  ? ? ?Review of Systems  ?Eyes:  Positive for redness. Negative for photophobia, pain, discharge and visual disturbance.  ?Gastrointestinal:  Positive for nausea (with migraine).  ?Neurological:  Positive for dizziness (with migraine) and headaches (migraines). Negative for light-headedness.  ? ? ?  Objective  ?  ?BP 120/73   Pulse 97   Temp 98.7 ?F (37.1 ?C) (Oral)   Wt 196 lb (88.9 kg)   SpO2 96%   BMI 31.88 kg/m?  ? ? ?Physical Exam ?Constitutional:   ?   Appearance: Normal appearance. She is obese.  ?HENT:  ?   Head: Normocephalic and atraumatic.  ?Eyes:  ?   General: Lids are normal. Vision grossly intact.     ?   Right eye: No foreign body or discharge.     ?   Left eye: No foreign body or discharge.  ?   Extraocular Movements:  ?   Right eye:  Normal extraocular motion and no nystagmus.  ?   Left eye: Normal extraocular motion and no nystagmus.  ?   Conjunctiva/sclera:  ?   Left eye: Left conjunctiva is not injected. Hemorrhage present. No chemosis or exudate. ?   Pupils: Pupils are equal, round, and reactive to light.  ?Neurological:  ?   Mental Status: She is alert.  ?Psychiatric:     ?   Attention and Perception: Attention and perception normal.     ?   Mood and Affect: Mood and affect normal.     ?   Speech: Speech normal.     ?   Behavior: Behavior normal. Behavior is cooperative.  ?  ? ? ?No results found for any visits on 10/03/21. ? Assessment & Plan  ?  ? ?Problem List Items Addressed This Visit   ?None ?Visit Diagnoses   ? ? Subconjunctival hemorrhage of left eye    -  Primary ?Acute, new  problem ?Patient concerned as she has had two subconjunctival hemorrhages over the past few days in setting of migraines ?Reassurance provided that these are typically benign ?Physical exam reassuring for uncomplicated subconjunctival hemorrhage at this time  ?Recommend using lubricating eye drops as needed for eye irritation, avoiding rubbing eyes or pressure to eyes to prevent recurrence ?Follow up as needed for persistent or worsening symptoms   ? ?  ? ? ? ?No follow-ups on file. ? ? ?I, Akaash Vandewater E Brunilda Eble, PA-C, have reviewed all documentation for this visit. The documentation on 10/03/21 for the exam, diagnosis, procedures, and orders are all accurate and complete. ? ?Ares Tegtmeyer, MHS, PA-C ?[facility] ?[facility]  ? ? ? ? ?

## 2021-10-07 ENCOUNTER — Telehealth: Payer: Self-pay | Admitting: Podiatry

## 2021-10-07 NOTE — Telephone Encounter (Signed)
Sent via our website contact form:  ?Hello, ?I am a patient of Dr. Gala Lewandowsky. My employer recently changed their policy about shoes, and they now require a doctor's note if I want to wear certain types of shoes. I am requesting a medical note from Dr. Logan Bores that says that I should be allowed to wear shoes that are most comfortable for my foot condition. ?Please email this to me at [email or web address] ?Thank you, ?Tammy Stout  ? ?

## 2021-10-08 NOTE — Telephone Encounter (Signed)
That's fine. Ammie could you please provide the note and email it?  Thanks- Dr. Logan Bores

## 2021-10-16 NOTE — Telephone Encounter (Signed)
Can you write this note for patient and email?

## 2022-02-16 DIAGNOSIS — F3289 Other specified depressive episodes: Secondary | ICD-10-CM | POA: Diagnosis not present

## 2022-02-25 DIAGNOSIS — F3289 Other specified depressive episodes: Secondary | ICD-10-CM | POA: Diagnosis not present

## 2022-03-11 DIAGNOSIS — F3289 Other specified depressive episodes: Secondary | ICD-10-CM | POA: Diagnosis not present

## 2022-04-01 DIAGNOSIS — F3289 Other specified depressive episodes: Secondary | ICD-10-CM | POA: Diagnosis not present

## 2022-04-13 DIAGNOSIS — F3289 Other specified depressive episodes: Secondary | ICD-10-CM | POA: Diagnosis not present

## 2022-05-03 IMAGING — MR MR FOOT*R* W/O CM
5 series · 40 of 40 positions shown · non-contrast
Comparison: X-ray 01/23/2020

CLINICAL DATA: Right heel pain, plantar fasciitis

EXAM:
MRI OF THE RIGHT FOOT WITHOUT CONTRAST
TECHNIQUE: Multiplanar, multisequence MR imaging of the right hindfoot was
performed. No intravenous contrast was administered.

[Series 4: T2 fat-sat · axial · 3.0mm · 0.56mm/px · z∈[-80,+49]mm · 11 of 34 slices shown (1 of 2)]
[im 1/34]
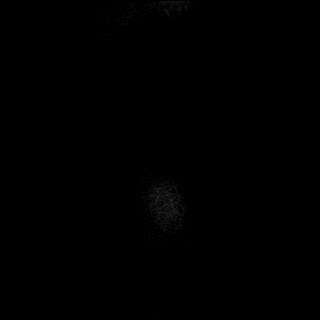
[im 4/34]
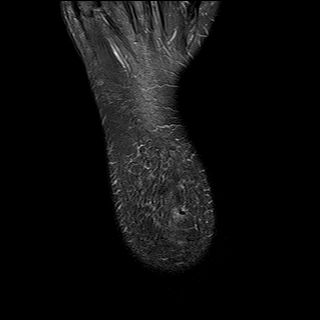
[im 7/34]
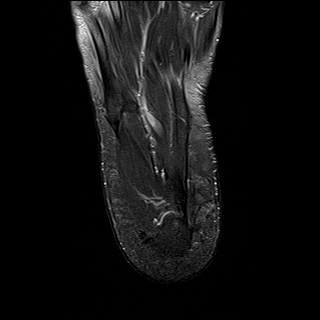
[im 10/34]
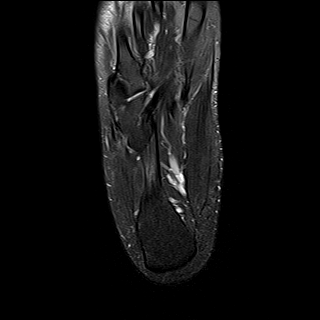
[im 14/34]
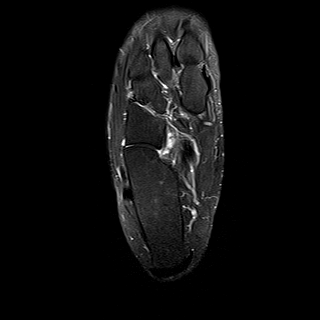
[im 17/34]
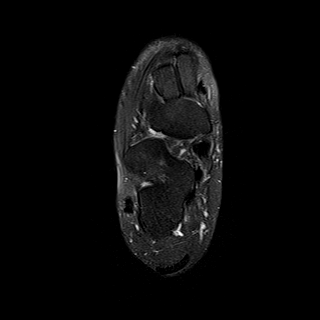
[im 20/34]
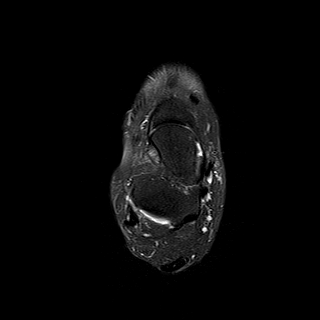
[im 24/34]
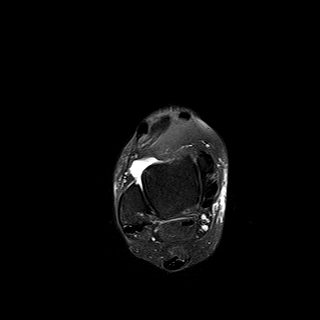
[im 27/34]
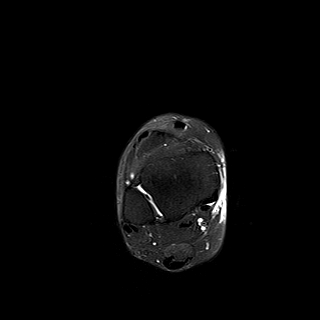
[im 30/34]
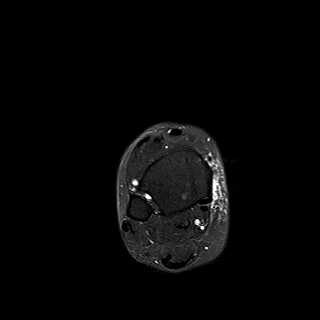
[im 34/34]
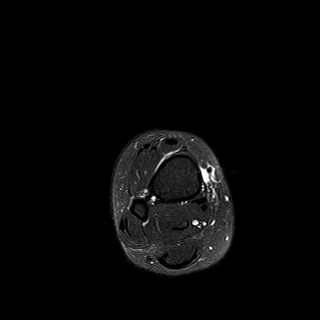

[Series 5: PD fat-sat · axial · 3.0mm · 0.56mm/px · z∈[-76,+45]mm · 9 of 32 slices shown]
[im 1/32]
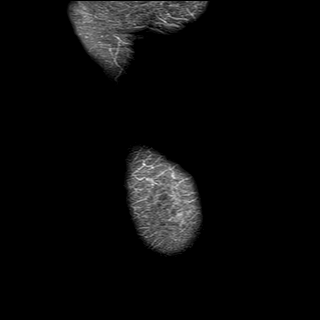
[im 4/32]
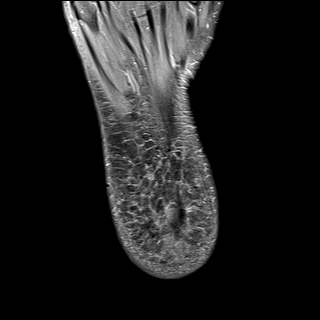
[im 8/32]
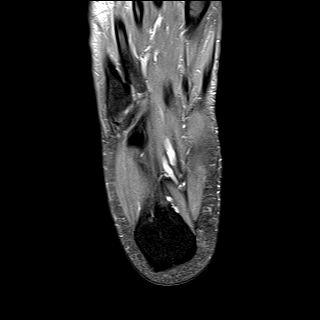
[im 12/32]
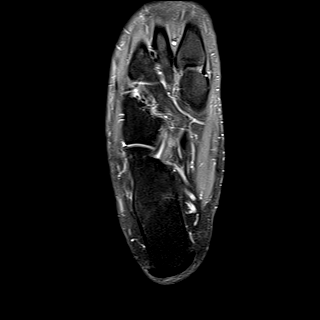
[im 16/32]
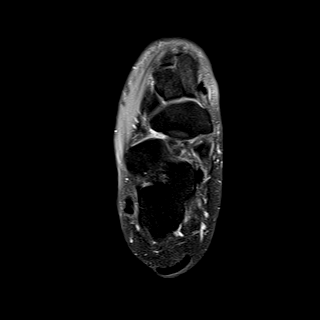
[im 20/32]
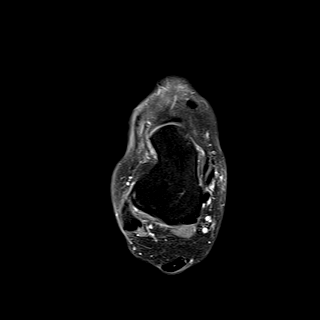
[im 24/32]
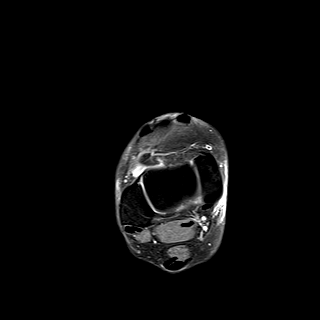
[im 28/32]
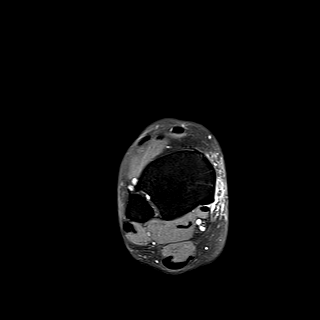
[im 32/32]
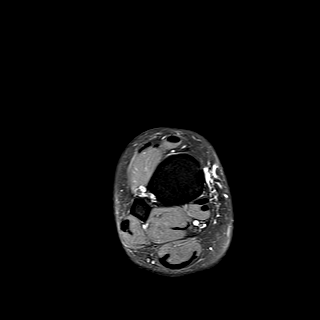

[Series 6: T1 · sagittal · 4.0mm · 0.56mm/px · 5 of 18 slices shown]
[im 1/18]
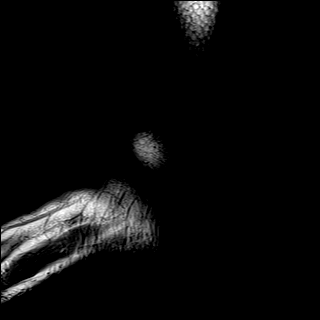
[im 5/18]
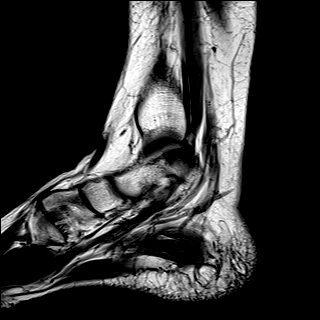
[im 9/18]
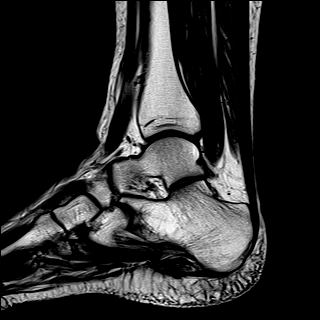
[im 13/18]
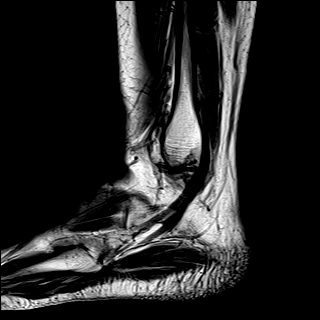
[im 18/18]
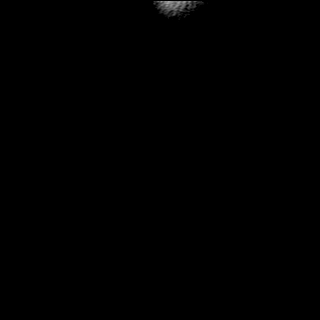

[Series 7: STIR · sagittal · 4.0mm · 0.35mm/px · 5 of 18 slices shown]
[im 1/18]
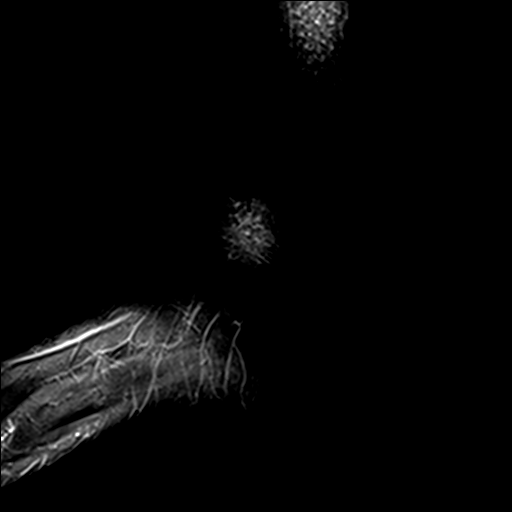
[im 5/18]
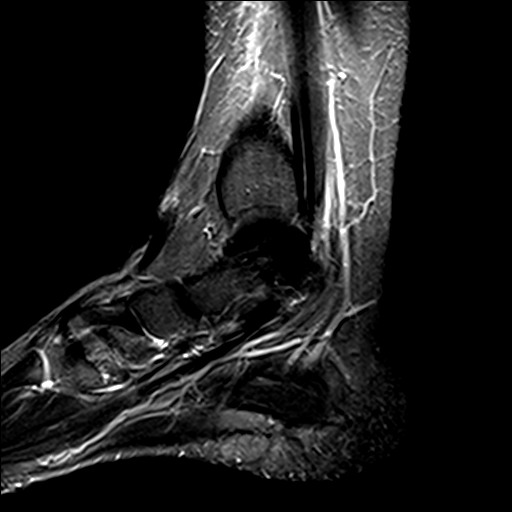
[im 9/18]
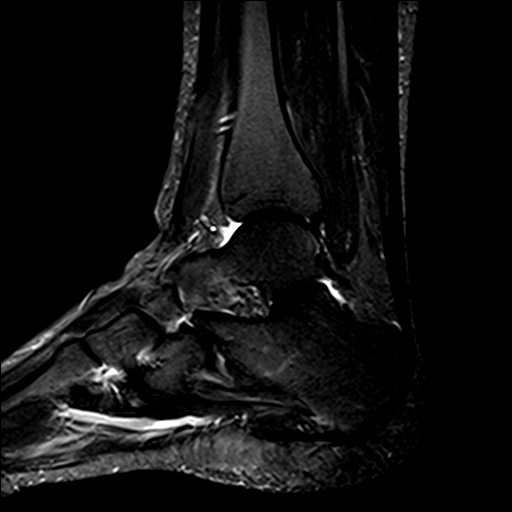
[im 13/18]
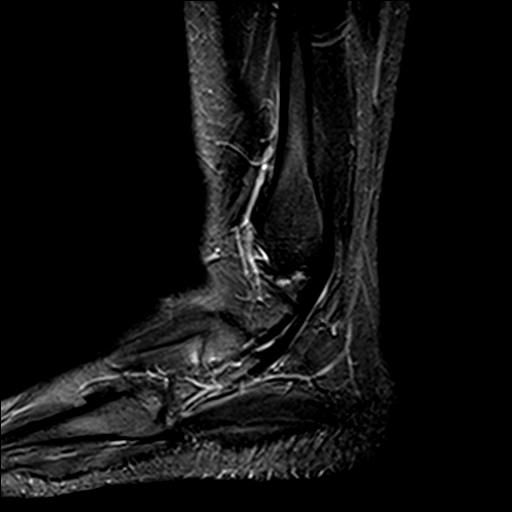
[im 18/18]
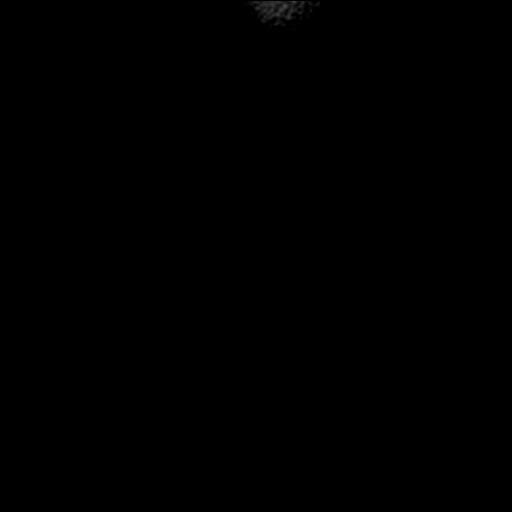

[Series 8: T2 fat-sat · coronal · 3.0mm · 0.50mm/px · 10 of 35 slices shown (2 of 2)]
[im 1/35]
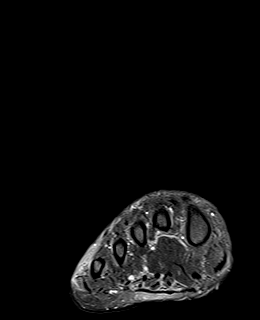
[im 4/35]
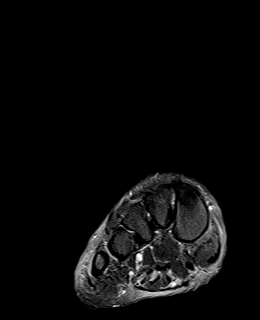
[im 8/35]
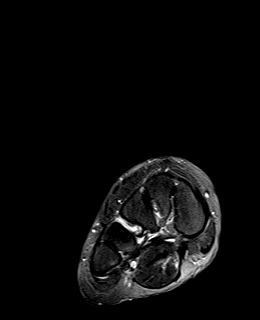
[im 12/35]
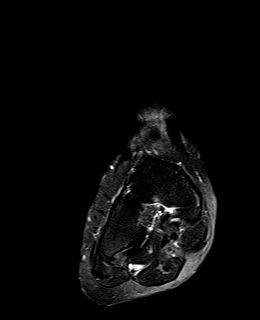
[im 16/35]
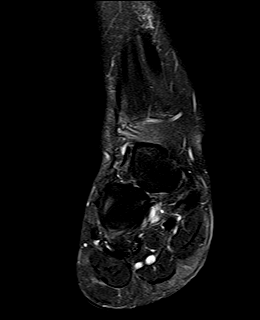
[im 19/35]
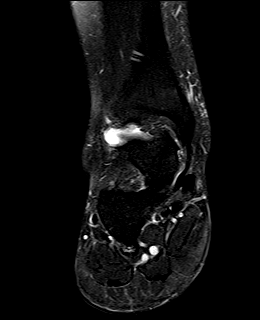
[im 23/35]
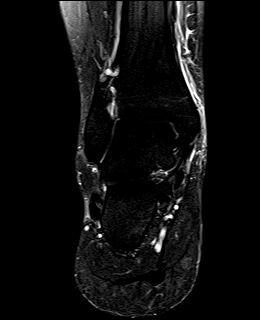
[im 27/35]
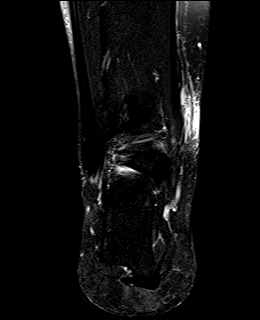
[im 31/35]
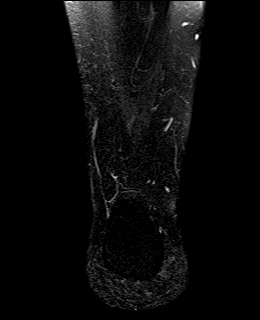
[im 35/35]
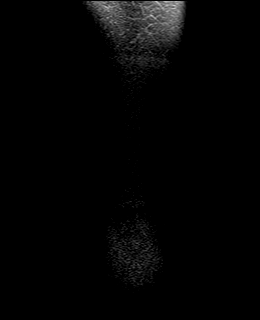

[40 of 40 positions shown; findings below may reference images not displayed]

FINDINGS: TENDONS

Peroneal: Intact peroneus longus and peroneus brevis tendons.

Posteromedial: Intact tibialis posterior, flexor hallucis longus and
flexor digitorum longus tendons.

Anterior: Intact tibialis anterior, extensor hallucis longus and
extensor digitorum longus tendons.

Achilles: Intact.

Plantar Fascia: Fusiform thickening of the central band of the
plantar fascia with minimal perifascial edema. No plantar fascial
tear.

LIGAMENTS

Lateral: The anterior and posterior tibiofibular ligaments are
intact. The anterior and posterior talofibular ligaments are intact.
Intact calcaneofibular ligament.

Medial: Deltoid ligament and spring ligament complex intact.

CARTILAGE

Ankle Joint: No joint effusion or chondral defect.

Subtalar Joints/Sinus Tarsi: No joint effusion or chondral defect.
Preservation of the anatomic fat within the sinus tarsi.

Bones: No marrow signal abnormality. No fracture or dislocation.

Other: Subcutaneous edema overlies the medial malleolus. No fluid
collection.
IMPRESSION: Mild plantar fasciitis. No plantar fascial tear.

## 2022-05-11 DIAGNOSIS — F3289 Other specified depressive episodes: Secondary | ICD-10-CM | POA: Diagnosis not present

## 2022-06-01 ENCOUNTER — Other Ambulatory Visit: Payer: Self-pay | Admitting: Nurse Practitioner

## 2022-06-01 NOTE — Telephone Encounter (Unsigned)
Copied from CRM [id number]. Topic: General - Other >> Jun 01, 2022 10:22 AM Everette C wrote: Reason for CRM: Medication Refill - Medication: DULoxetine (CYMBALTA) 60 MG capsule [[id number]]  Has the patient contacted their pharmacy? Yes.  The patient's husband has been directed to contact their PCP  (Agent: If no, request that the patient contact the pharmacy for the refill. If patient does not wish to contact the pharmacy document the reason why and proceed with request.) (Agent: If yes, when and what did the pharmacy advise?)  Preferred Pharmacy (with phone number or street name): [id number], [place] - [address] [place] [address] Phone: [phone] Fax: [phone] Hours: Not open 24 hours  Has the patient been seen for an appointment in the last year OR does the patient have an upcoming appointment? Yes.    Agent: Please be advised that RX refills may take up to 3 business days. We ask that you follow-up with your pharmacy.

## 2022-06-02 MED ORDER — DULOXETINE HCL 60 MG PO CPEP: ORAL_CAPSULE | ORAL | 0 refills | Status: DC

## 2022-06-07 NOTE — Patient Instructions (Incomplete)
Restless Legs Syndrome Restless legs syndrome is a condition that causes uncomfortable feelings or sensations in the legs, especially while sitting or lying down. The sensations usually cause an overwhelming urge to move the legs. The arms can also sometimes be affected. The condition can range from mild to severe. The symptoms often interfere with a person's ability to sleep. What are the causes? The cause of this condition is not known. What increases the risk? The following factors may make you more likely to develop this condition: Being older than 50. Pregnancy. Being a woman. In general, the condition is more common in women than in men. A family history of the condition. Having iron deficiency. Overuse of caffeine, nicotine, or alcohol. Certain medical conditions, such as kidney disease, Parkinson's disease, or nerve damage. Certain medicines, such as those for high blood pressure, nausea, colds, allergies, depression, and some heart conditions. What are the signs or symptoms? The main symptom of this condition is uncomfortable sensations in the legs, such as: Pulling. Tingling. Prickling. Throbbing. Crawling. Burning. Usually, the sensations: Affect both sides of the body. Are worse when you sit or lie down. Are worse at night. These may make it difficult to fall asleep. Make you have a strong urge to move your legs. Are temporarily relieved by moving your legs or standing. The arms can also be affected, but this is rare. People who have this condition often have tiredness during the day because of their lack of sleep at night. How is this diagnosed? This condition may be diagnosed based on: Your symptoms. Blood tests. In some cases, you may be monitored in a sleep lab by a specialist (a sleep study). This can detect any disruptions in your sleep. How is this treated? This condition is treated by managing the symptoms. This may include: Lifestyle changes, such as  exercising, using relaxation techniques, and avoiding caffeine, alcohol, or tobacco. Iron supplements. Medicines. Parkinson's medications may be tried first. Anti-seizure medications can also be helpful. Follow these instructions at home: General instructions Take over-the-counter and prescription medicines only as told by your health care provider. Use methods to help relieve the uncomfortable sensations, such as: Massaging your legs. Walking or stretching. Taking a cold or hot bath. Keep all follow-up visits. This is important. Lifestyle     Practice good sleep habits. For example, go to bed and get up at the same time every day. Most adults should get 7-9 hours of sleep each night. Exercise regularly. Try to get at least 30 minutes of exercise most days of the week. Practice ways of relaxing, such as yoga or meditation. Avoid caffeine and alcohol. Do not use any products that contain nicotine or tobacco. These products include cigarettes, chewing tobacco, and vaping devices, such as e-cigarettes. If you need help quitting, ask your health care provider. Where to find more information National Institute of Neurological Disorders and Stroke: www.ninds.nih.gov Contact a health care provider if: Your symptoms get worse or they do not improve with treatment. Summary Restless legs syndrome is a condition that causes uncomfortable feelings or sensations in the legs, especially while sitting or lying down. The symptoms often interfere with your ability to sleep. This condition is treated by managing the symptoms. You may need to make lifestyle changes or take medicines. This information is not intended to replace advice given to you by your health care provider. Make sure you discuss any questions you have with your health care provider. Document Revised: 01/19/2021 Document Reviewed: 01/19/2021 Elsevier Patient Education    2023 [employer]  

## 2022-06-08 ENCOUNTER — Encounter: Payer: Self-pay | Admitting: Nurse Practitioner

## 2022-06-08 ENCOUNTER — Ambulatory Visit (INDEPENDENT_AMBULATORY_CARE_PROVIDER_SITE_OTHER): Payer: BC Managed Care – PPO | Admitting: Nurse Practitioner

## 2022-06-08 VITALS — BP 115/76 | HR 79 | Temp 98.5°F | Ht 65.75 in | Wt 205.9 lb

## 2022-06-08 DIAGNOSIS — E782 Mixed hyperlipidemia: Secondary | ICD-10-CM

## 2022-06-08 DIAGNOSIS — R7301 Impaired fasting glucose: Secondary | ICD-10-CM

## 2022-06-08 DIAGNOSIS — E559 Vitamin D deficiency, unspecified: Secondary | ICD-10-CM | POA: Diagnosis not present

## 2022-06-08 DIAGNOSIS — F325 Major depressive disorder, single episode, in full remission: Secondary | ICD-10-CM | POA: Diagnosis not present

## 2022-06-08 DIAGNOSIS — E66811 Obesity, class 1: Secondary | ICD-10-CM

## 2022-06-08 DIAGNOSIS — G43829 Menstrual migraine, not intractable, without status migrainosus: Secondary | ICD-10-CM | POA: Diagnosis not present

## 2022-06-08 DIAGNOSIS — Z0000 Encounter for general adult medical examination without abnormal findings: Secondary | ICD-10-CM

## 2022-06-08 DIAGNOSIS — Z23 Encounter for immunization: Secondary | ICD-10-CM | POA: Diagnosis not present

## 2022-06-08 DIAGNOSIS — Z803 Family history of malignant neoplasm of breast: Secondary | ICD-10-CM

## 2022-06-08 DIAGNOSIS — Z1211 Encounter for screening for malignant neoplasm of colon: Secondary | ICD-10-CM

## 2022-06-08 DIAGNOSIS — Z1231 Encounter for screening mammogram for malignant neoplasm of breast: Secondary | ICD-10-CM

## 2022-06-08 DIAGNOSIS — G2581 Restless legs syndrome: Secondary | ICD-10-CM

## 2022-06-08 DIAGNOSIS — Z6833 Body mass index (BMI) 33.0-33.9, adult: Secondary | ICD-10-CM

## 2022-06-08 DIAGNOSIS — E6609 Other obesity due to excess calories: Secondary | ICD-10-CM

## 2022-06-08 MED ORDER — METOPROLOL TARTRATE 25 MG PO TABS: ORAL_TABLET | ORAL | 4 refills | Status: DC

## 2022-06-08 MED ORDER — SUMATRIPTAN SUCCINATE 50 MG PO TABS: ORAL_TABLET | ORAL | 12 refills | Status: AC

## 2022-06-08 MED ORDER — DULOXETINE HCL 60 MG PO CPEP: ORAL_CAPSULE | ORAL | 4 refills | Status: DC

## 2022-06-08 NOTE — Assessment & Plan Note (Addendum)
Noted on past labs, check A1c today. 

## 2022-06-08 NOTE — Progress Notes (Signed)
BP 115/76   Pulse 79   Temp 98.5 F (36.9 C) (Oral)   Ht 5' 5.75" (1.67 m)   Wt 205 lb 14.4 oz (93.4 kg)   SpO2 99%   BMI 33.49 kg/m    Subjective:    Patient ID: Tammy Stout, female    DOB: 09-Jul-1975, 46 y.o.   MRN: [id number]  HPI: Tammy Stout is a 46 y.o. female presenting on 06/08/2022 for comprehensive medical examination. Current medical complaints include:none  She currently lives with: husband Menopausal Symptoms: no   MIGRAINES Continues on Metoprolol, Imitrex and Duloxetine. Has not been eating well recently and has noticed more migraines and mood changes with this.  Currently having migraines once every 2 weeks.  Has restless leg syndrome -- rarely takes Requip. Duration: chronic Headache status at time of visit: symptomatic -- Imitrex helped Treatments attempted: aleve, excedrin and triptans   Aura: no Nausea:  yes Vomiting: no Photophobia:  yes Phonophobia:  yes Effect on social functioning:  yes Numbers of missed days of school/work each month: none Confusion:  no Gait disturbance/ataxia:  no Behavioral changes:  no Fevers:  no  The 10-year ASCVD risk score (Arnett DK, et al., 2019) is: 0.6%   Values used to calculate the score:     Age: 2 years     Sex: Female     Is Non-Hispanic African American: No     Diabetic: No     Tobacco smoker: No     Systolic Blood Pressure: 557 mmHg     Is BP treated: No     HDL Cholesterol: 63 mg/dL     Total Cholesterol: 223 mg/dL  Depression Screen done today and results listed below:     06/08/2022   10:38 AM 06/03/2021    4:19 PM 11/29/2020    9:06 AM 05/03/2020    2:14 PM 10/17/2019    2:47 PM  Depression screen PHQ 2/9  Decreased Interest 0 0 0 0 0  Down, Depressed, Hopeless 0 0 0 0 0  PHQ - 2 Score 0 0 0 0 0  Altered sleeping 0 0 0 0 0  Tired, decreased energy 0 1 0 0 0  Change in appetite 0 1 0 0 0  Feeling bad or failure about yourself  0 0 0 0 0  Trouble concentrating 0 0 0 0 0   Moving slowly or fidgety/restless 0 0 0 0 0  Suicidal thoughts 0 0 0 0 0  PHQ-9 Score 0 2 0 0 0  Difficult doing work/chores Not difficult at all Not difficult at all Not difficult at all Not difficult at all Not difficult at all      06/08/2022   10:38 AM 06/03/2021    4:19 PM 08/02/2018    4:28 PM  GAD 7 : Generalized Anxiety Score  Nervous, Anxious, on Edge 0 0 0  Control/stop worrying 0 0 0  Worry too much - different things 0 0 0  Trouble relaxing 0 0 0  Restless 0 0 0  Easily annoyed or irritable 1 0 0  Afraid - awful might happen 0 0 0  Total GAD 7 Score 1 0 0  Anxiety Difficulty Not difficult at all Not difficult at all       11/08/2018    8:12 AM 05/03/2020    2:14 PM 11/29/2020    9:06 AM 06/03/2021    4:18 PM 06/08/2022   10:38 AM  Fall Risk  Falls in  the past year?  0 0 0 0  Was there an injury with Fall?  0 0 0 0  Fall Risk Category Calculator  0 0 0 0  Fall Risk Category  Low Low Low Low  Patient Fall Risk Level Low fall risk Low fall risk  Low fall risk   Patient at Risk for Falls Due to  No Fall Risks No Fall Risks No Fall Risks No Fall Risks  Fall risk Follow up  Falls evaluation completed Falls evaluation completed Falls evaluation completed Falls evaluation completed    Functional Status Survey: Is the patient deaf or have difficulty hearing?: No Does the patient have difficulty seeing, even when wearing glasses/contacts?: No Does the patient have difficulty concentrating, remembering, or making decisions?: No Does the patient have difficulty walking or climbing stairs?: No Does the patient have difficulty dressing or bathing?: No Does the patient have difficulty doing errands alone such as visiting a doctor's office or shopping?: No    Past Medical History:  Past Medical History:  Diagnosis Date   Depression    Dermatitis 2012   Eczema    Perioral dermatitis    Tendonitis    right hand    Surgical History:  Past Surgical History:   Procedure Laterality Date   CESAREAN SECTION  05/2006   SECTION DUE TO NO [place] AND CORD WAS AROUND BABY NECK   FOOT TENDON SURGERY  06/03/2019   Plantar fascitis    Medications:  Current Outpatient Medications on File Prior to Visit  Medication Sig   cetirizine (ZYRTEC) 10 MG tablet Take 10 mg by mouth daily.   rOPINIRole (REQUIP) 0.25 MG tablet TAKE ONE TABLET BY MOUTH AT BEDTIME.   No current facility-administered medications on file prior to visit.    Allergies:  No Known Allergies  Social History:  Social History   Socioeconomic History   Marital status: Married    Spouse name: Not on file   Number of children: Not on file   Years of education: Not on file   Highest education level: Not on file  Occupational History   Not on file  Tobacco Use   Smoking status: Never   Smokeless tobacco: Never  Vaping Use   Vaping Use: Never used  Substance and Sexual Activity   Alcohol use: Yes    Comment: social   Drug use: No   Sexual activity: Yes    Birth control/protection: I.U.D.  Other Topics Concern   Not on file  Social History Narrative   Not on file   Social Determinants of Health   Financial Resource Strain: Low Risk  (06/03/2021)   Overall Financial Resource Strain (CARDIA)    Difficulty of Paying Living Expenses: Not hard at all  Food Insecurity: No Food Insecurity (06/03/2021)   Hunger Vital Sign    Worried About Running Out of Food in the Last Year: Never true    Ran Out of Food in the Last Year: Never true  Transportation Needs: No Transportation Needs (06/03/2021)   PRAPARE - Hydrologist (Medical): No    Lack of Transportation (Non-Medical): No  Physical Activity: Sufficiently Active (06/03/2021)   Exercise Vital Sign    Days of Exercise per Week: 4 days    Minutes of Exercise per Session: 40 min  Stress: No Stress Concern Present (06/03/2021)   [place]    Feeling of Stress : Only a little  Social Connections: Moderately Isolated (06/03/2021)   Social Connection and Isolation Panel [NHANES]    Frequency of Communication with Friends and Family: More than three times a week    Frequency of Social Gatherings with Friends and Family: More than three times a week    Attends Religious Services: Never    Marine scientist or Organizations: No    Attends Archivist Meetings: Never    Marital Status: Married  Human resources officer Violence: Not At Risk (06/03/2021)   Humiliation, Afraid, Rape, and Kick questionnaire    Fear of Current or Ex-Partner: No    Emotionally Abused: No    Physically Abused: No    Sexually Abused: No   Social History   Tobacco Use  Smoking Status Never  Smokeless Tobacco Never   Social History   Substance and Sexual Activity  Alcohol Use Yes   Comment: social    Family History:  Family History  Problem Relation Age of Onset   Cancer Paternal Grandmother 13       kidney cancer   Cancer Maternal Grandmother 30       lung cancer   Diabetes Maternal Grandmother    Diabetes Mother    Cancer Mother 48       One breast   Thyroid disease Father    Hyperlipidemia Father    Diabetes Maternal Grandfather    Thyroid disease Maternal Aunt     Past medical history, surgical history, medications, allergies, family history and social history reviewed with patient today and changes made to appropriate areas of the chart.   Review of Systems - negative All other ROS negative except what is listed above and in the HPI.      Objective:    BP 115/76   Pulse 79   Temp 98.5 F (36.9 C) (Oral)   Ht 5' 5.75" (1.67 m)   Wt 205 lb 14.4 oz (93.4 kg)   SpO2 99%   BMI 33.49 kg/m   Wt Readings from Last 3 Encounters:  06/08/22 205 lb 14.4 oz (93.4 kg)  10/03/21 196 lb (88.9 kg)  06/03/21 201 lb 6.4 oz (91.4 kg)    Physical Exam Vitals and nursing note reviewed.  Constitutional:       General: She is awake. She is not in acute distress.    Appearance: She is well-developed and well-groomed. She is obese. She is not ill-appearing.  HENT:     Head: Normocephalic and atraumatic.     Right Ear: Hearing, tympanic membrane, ear canal and external ear normal. No drainage.     Left Ear: Hearing, tympanic membrane, ear canal and external ear normal. No drainage.     Nose: Nose normal.     Right Sinus: No maxillary sinus tenderness or frontal sinus tenderness.     Left Sinus: No maxillary sinus tenderness or frontal sinus tenderness.     Mouth/Throat:     Mouth: Mucous membranes are moist.     Pharynx: Oropharynx is clear. Uvula midline. No pharyngeal swelling, oropharyngeal exudate or posterior oropharyngeal erythema.  Eyes:     General: Lids are normal.        Right eye: No discharge.        Left eye: No discharge.     Extraocular Movements: Extraocular movements intact.     Conjunctiva/sclera: Conjunctivae normal.     Pupils: Pupils are equal, round, and reactive to light.     Visual Fields: Right eye visual fields normal and  left eye visual fields normal.  Neck:     Thyroid: No thyromegaly.     Vascular: No carotid bruit.     Trachea: Trachea normal.  Cardiovascular:     Rate and Rhythm: Normal rate and regular rhythm.     Heart sounds: Normal heart sounds. No murmur heard.    No gallop.  Pulmonary:     Effort: Pulmonary effort is normal. No accessory muscle usage or respiratory distress.     Breath sounds: Normal breath sounds.  Chest:  Breasts:    Right: Normal.     Left: Normal.  Abdominal:     General: Bowel sounds are normal.     Palpations: Abdomen is soft. There is no hepatomegaly or splenomegaly.     Tenderness: There is no abdominal tenderness.  Musculoskeletal:        General: Normal range of motion.     Cervical back: Normal range of motion and neck supple.     Right lower leg: No edema.     Left lower leg: No edema.  Lymphadenopathy:     Head:      Right side of head: No submental, submandibular, tonsillar, preauricular or posterior auricular adenopathy.     Left side of head: No submental, submandibular, tonsillar, preauricular or posterior auricular adenopathy.     Cervical: No cervical adenopathy.     Upper Body:     Right upper body: No supraclavicular, axillary or pectoral adenopathy.     Left upper body: No supraclavicular, axillary or pectoral adenopathy.  Skin:    General: Skin is warm and dry.     Capillary Refill: Capillary refill takes less than 2 seconds.     Findings: No rash.  Neurological:     Mental Status: She is alert and oriented to person, place, and time.     Gait: Gait is intact.     Deep Tendon Reflexes: Reflexes are normal and symmetric.     Reflex Scores:      Brachioradialis reflexes are 2+ on the right side and 2+ on the left side.      Patellar reflexes are 2+ on the right side and 2+ on the left side. Psychiatric:        Attention and Perception: Attention normal.        Mood and Affect: Mood normal.        Speech: Speech normal.        Behavior: Behavior normal. Behavior is cooperative.        Thought Content: Thought content normal.        Judgment: Judgment normal.    Results for orders placed or performed in visit on 06/03/21  CBC with Differential/Platelet  Result Value Ref Range   WBC 7.1 3.4 - 10.8 x10E3/uL   RBC 3.80 3.77 - 5.28 x10E6/uL   Hemoglobin 11.3 11.1 - 15.9 g/dL   Hematocrit 33.7 (L) 34.0 - 46.6 %   MCV 89 79 - 97 fL   MCH 29.7 26.6 - 33.0 pg   MCHC 33.5 31.5 - 35.7 g/dL   RDW 12.5 11.7 - 15.4 %   Platelets 263 150 - 450 x10E3/uL   Neutrophils 60 Not Estab. %   Lymphs 25 Not Estab. %   Monocytes 9 Not Estab. %   Eos 5 Not Estab. %   Basos 1 Not Estab. %   Neutrophils Absolute 4.2 1.4 - 7.0 x10E3/uL   Lymphocytes Absolute 1.8 0.7 - 3.1 x10E3/uL   Monocytes Absolute 0.7  0.1 - 0.9 x10E3/uL   EOS (ABSOLUTE) 0.4 0.0 - 0.4 x10E3/uL   Basophils Absolute 0.0 0.0 - 0.2  x10E3/uL   Immature Granulocytes 0 Not Estab. %   Immature Grans (Abs) 0.0 0.0 - 0.1 x10E3/uL  Comprehensive metabolic panel  Result Value Ref Range   Glucose 92 70 - 99 mg/dL   BUN 13 6 - 24 mg/dL   Creatinine, Ser 0.69 0.57 - 1.00 mg/dL   eGFR 110 >59 mL/min/1.73   BUN/Creatinine Ratio 19 9 - 23   Sodium 141 134 - 144 mmol/L   Potassium 3.8 3.5 - 5.2 mmol/L   Chloride 103 96 - 106 mmol/L   CO2 25 20 - 29 mmol/L   Calcium 8.6 (L) 8.7 - 10.2 mg/dL   Total Protein 6.9 6.0 - 8.5 g/dL   Albumin 4.5 3.8 - 4.8 g/dL   Globulin, Total 2.4 1.5 - 4.5 g/dL   Albumin/Globulin Ratio 1.9 1.2 - 2.2   Bilirubin Total <0.2 0.0 - 1.2 mg/dL   Alkaline Phosphatase 82 44 - 121 IU/L   AST 18 0 - 40 IU/L   ALT 18 0 - 32 IU/L  Lipid Panel w/o Chol/HDL Ratio  Result Value Ref Range   Cholesterol, Total 223 (H) 100 - 199 mg/dL   Triglycerides 149 0 - 149 mg/dL   HDL 63 >39 mg/dL   VLDL Cholesterol Cal 26 5 - 40 mg/dL   LDL Chol Calc (NIH) 134 (H) 0 - 99 mg/dL  TSH  Result Value Ref Range   TSH 2.770 0.450 - 4.500 uIU/mL  HgB A1c  Result Value Ref Range   Hgb A1c MFr Bld 5.9 (H) 4.8 - 5.6 %   Est. average glucose Bld gHb Est-mCnc 123 mg/dL  Iron and TIBC  Result Value Ref Range   Total Iron Binding Capacity 343 250 - 450 ug/dL   UIBC 279 131 - 425 ug/dL   Iron 64 27 - 159 ug/dL   Iron Saturation 19 15 - 55 %  Ferritin  Result Value Ref Range   Ferritin 40 15 - 150 ng/mL  VITAMIN D 25 Hydroxy (Vit-D Deficiency, Fractures)  Result Value Ref Range   Vit D, 25-Hydroxy 31.7 30.0 - 100.0 ng/mL  Vitamin B12  Result Value Ref Range   Vitamin B-12 472 232 - 1,245 pg/mL  Cytology - PAP  Result Value Ref Range   High risk HPV Negative    Adequacy      Satisfactory for evaluation; transformation zone component PRESENT.   Diagnosis      - Negative for intraepithelial lesion or malignancy (NILM)   Comment Normal Reference Range HPV - Negative       Assessment & Plan:   Problem List Items  Addressed This Visit       Cardiovascular and Mediastinum   Migraine    Chronic, stable.  Continue Metoprolol + Imitrex for acute episodes only.  Recommend she continue to monitor HR and notify provider if levels <60 consistently.  Will adjust Metoprolol dose as needed based on symptoms.  Return in 12 months, sooner if any worsening symptoms.  CMP and CBC + TSH today.      Relevant Medications   metoprolol tartrate (LOPRESSOR) 25 MG tablet   DULoxetine (CYMBALTA) 60 MG capsule   SUMAtriptan (IMITREX) 50 MG tablet   Other Relevant Orders   CBC with Differential/Platelet     Endocrine   IFG (impaired fasting glucose)    Noted on past labs, check A1c  today.      Relevant Orders   Comprehensive metabolic panel   HgB H2D     Other   Depression - Primary    Chronic, ongoing.  Denies SI/HI.  Continue Duloxetine as ordered, refills up to date.  Adjust doses as needed.  Return in 12 months.      Relevant Medications   DULoxetine (CYMBALTA) 60 MG capsule   Other Relevant Orders   CBC with Differential/Platelet   TSH   Family history of breast cancer in female    Mammogram ordered.      Hyperlipidemia    Chronic.  Noted on past labs with ASCVD 0.6%.  Continue diet and exercise focus, lipid panel at today.      Relevant Medications   metoprolol tartrate (LOPRESSOR) 25 MG tablet   Other Relevant Orders   Comprehensive metabolic panel   Lipid Panel w/o Chol/HDL Ratio   Obesity    BMI 33.49.  Recommended eating smaller high protein, low fat meals more frequently and exercising 30 mins a day 5 times a week with a goal of 10-15lb weight loss in the next 3 months. Patient voiced their understanding and motivation to adhere to these recommendations.       Restless leg    Chronic, improved.  Rarely uses medicine.        Other Visit Diagnoses     Vitamin D deficiency       History of low levels reported, check today and start supplement as needed.   Relevant Orders   VITAMIN  D 25 Hydroxy (Vit-D Deficiency, Fractures)   Flu vaccine need       Flu vaccine in office today.   Relevant Orders   Flu Vaccine QUAD 6+ mos PF IM (Fluarix Quad PF) (Completed)   Colon cancer screening       GI order placed.   Relevant Orders   Ambulatory referral to Gastroenterology   Encounter for screening mammogram for malignant neoplasm of breast       Mammogram ordered today.   Relevant Orders   MM 3D SCREEN BREAST BILATERAL   Encounter for annual physical exam       Annual physical today with labs and health maintenance reviewed, discussed with patient.        Follow up plan: Return in about 1 year (around 06/09/2023) for Annual physical.   LABORATORY TESTING:  - Pap smear: Up To Date  IMMUNIZATIONS:   - Tdap: Tetanus vaccination status reviewed: last tetanus booster within 10 years. - Influenza: Up to date - Pneumovax: Not applicable - Prevnar: Not applicable - HPV: Not applicable - Zostavax vaccine: Not applicable - COVID == up to date   SCREENING: -Mammogram: Ordered today - Colonoscopy: Ordered today - Bone Density: Not applicable  -Hearing Test: Not applicable  -Spirometry: Not applicable   PATIENT COUNSELING:   Advised to take 1 mg of folate supplement per day if capable of pregnancy.   Sexuality: Discussed sexually transmitted diseases, partner selection, use of condoms, avoidance of unintended pregnancy  and contraceptive alternatives.   Advised to avoid cigarette smoking.  I discussed with the patient that most people either abstain from alcohol or drink within safe limits (<=14/week and <=4 drinks/occasion for males, <=7/weeks and <= 3 drinks/occasion for females) and that the risk for alcohol disorders and other health effects rises proportionally with the number of drinks per week and how often a drinker exceeds daily limits.  Discussed cessation/primary prevention of drug use and availability  of treatment for abuse.   Diet: Encouraged to adjust  caloric intake to maintain  or achieve ideal body weight, to reduce intake of dietary saturated fat and total fat, to limit sodium intake by avoiding high sodium foods and not adding table salt, and to maintain adequate dietary potassium and calcium preferably from fresh fruits, vegetables, and low-fat dairy products.    Stressed the importance of regular exercise  Injury prevention: Discussed safety belts, safety helmets, smoke detector, smoking near bedding or upholstery.   Dental health: Discussed importance of regular tooth brushing, flossing, and dental visits.    NEXT PREVENTATIVE PHYSICAL DUE IN 1 YEAR. Return in about 1 year (around 06/09/2023) for Annual physical.

## 2022-06-08 NOTE — Assessment & Plan Note (Signed)
Chronic, stable.  Continue Metoprolol + Imitrex for acute episodes only.  Recommend she continue to monitor HR and notify provider if levels <60 consistently.  Will adjust Metoprolol dose as needed based on symptoms.  Return in 12 months, sooner if any worsening symptoms.  CMP and CBC + TSH today.

## 2022-06-08 NOTE — Assessment & Plan Note (Signed)
BMI 33.49.  Recommended eating smaller high protein, low fat meals more frequently and exercising 30 mins a day 5 times a week with a goal of 10-15lb weight loss in the next 3 months. Patient voiced their understanding and motivation to adhere to these recommendations.

## 2022-06-08 NOTE — Assessment & Plan Note (Signed)
Mammogram ordered

## 2022-06-08 NOTE — Assessment & Plan Note (Signed)
Chronic.  Noted on past labs with ASCVD 0.6%.  Continue diet and exercise focus, lipid panel at today.

## 2022-06-08 NOTE — Assessment & Plan Note (Signed)
Chronic, improved.  Rarely uses medicine.

## 2022-06-08 NOTE — Assessment & Plan Note (Signed)
Chronic, ongoing.  Denies SI/HI.  Continue Duloxetine as ordered, refills up to date.  Adjust doses as needed.  Return in 12 months.

## 2022-06-09 LAB — COMPREHENSIVE METABOLIC PANEL
ALT: 16 IU/L (ref 0–32)
AST: 19 IU/L (ref 0–40)
Albumin/Globulin Ratio: 1.9 (ref 1.2–2.2)
Albumin: 4.6 g/dL (ref 3.9–4.9)
Alkaline Phosphatase: 79 IU/L (ref 44–121)
BUN/Creatinine Ratio: 14 (ref 9–23)
BUN: 12 mg/dL (ref 6–24)
Bilirubin Total: 0.2 mg/dL (ref 0.0–1.2)
CO2: 28 mmol/L (ref 20–29)
Calcium: 9.6 mg/dL (ref 8.7–10.2)
Chloride: 101 mmol/L (ref 96–106)
Creatinine, Ser: 0.85 mg/dL (ref 0.57–1.00)
Globulin, Total: 2.4 g/dL (ref 1.5–4.5)
Glucose: 88 mg/dL (ref 70–99)
Potassium: 4.3 mmol/L (ref 3.5–5.2)
Sodium: 142 mmol/L (ref 134–144)
Total Protein: 7 g/dL (ref 6.0–8.5)
eGFR: 86 mL/min/{1.73_m2} (ref >59)

## 2022-06-09 LAB — CBC WITH DIFFERENTIAL/PLATELET
Basophils Absolute: 0 10*3/uL (ref 0.0–0.2)
Basos: 1 %
EOS (ABSOLUTE): 0.4 10*3/uL (ref 0.0–0.4)
Eos: 6 %
Hematocrit: 38.8 % (ref 34.0–46.6)
Hemoglobin: 12.6 g/dL (ref 11.1–15.9)
Immature Grans (Abs): 0 10*3/uL (ref 0.0–0.1)
Immature Granulocytes: 0 %
Lymphocytes Absolute: 1.5 10*3/uL (ref 0.7–3.1)
Lymphs: 24 %
MCH: 29.9 pg (ref 26.6–33.0)
MCHC: 32.5 g/dL (ref 31.5–35.7)
MCV: 92 fL (ref 79–97)
Monocytes Absolute: 0.5 10*3/uL (ref 0.1–0.9)
Monocytes: 9 %
Neutrophils Absolute: 3.8 10*3/uL (ref 1.4–7.0)
Neutrophils: 60 %
Platelets: 289 10*3/uL (ref 150–450)
RBC: 4.21 x10E6/uL (ref 3.77–5.28)
RDW: 12.8 % (ref 11.7–15.4)
WBC: 6.3 10*3/uL (ref 3.4–10.8)

## 2022-06-09 LAB — LIPID PANEL W/O CHOL/HDL RATIO
Cholesterol, Total: 248 mg/dL — ABNORMAL HIGH (ref 100–199)
HDL: 71 mg/dL (ref >39)
LDL Chol Calc (NIH): 156 mg/dL — ABNORMAL HIGH (ref 0–99)
Triglycerides: 120 mg/dL (ref 0–149)
VLDL Cholesterol Cal: 21 mg/dL (ref 5–40)

## 2022-06-09 LAB — HEMOGLOBIN A1C
Est. average glucose Bld gHb Est-mCnc: 123 mg/dL
Hgb A1c MFr Bld: 5.9 % — ABNORMAL HIGH (ref 4.8–5.6)

## 2022-06-09 LAB — VITAMIN D 25 HYDROXY (VIT D DEFICIENCY, FRACTURES): Vit D, 25-Hydroxy: 27 ng/mL — ABNORMAL LOW (ref 30.0–100.0)

## 2022-06-09 LAB — TSH: TSH: 2.37 u[IU]/mL (ref 0.450–4.500)

## 2022-06-09 NOTE — Progress Notes (Signed)
Contacted via MyChart The 10-year ASCVD risk score (Arnett DK, et al., 2019) is: 0.6%   Values used to calculate the score:     Age: 46 years     Sex: Female     Is Non-Hispanic African American: No     Diabetic: No     Tobacco smoker: No     Systolic Blood Pressure: 586 mmHg     Is BP treated: No     HDL Cholesterol: 71 mg/dL     Total Cholesterol: 248 mg/dL   Good morning Remo Lipps, your labs have returned: - Vitamin D level remains on lower side, recommend ensure you are taking your supplement Vitamin D3 2000 units daily. - A1c remains in prediabetic range, no worsening -- continue diet and exercise focus. - Kidney function, creatinine and eGFR, remains normal, as is liver function, AST and ALT.  - Your cholesterol is still high, but continued recommendations to make lifestyle changes. Your LDL is above normal. The LDL is the bad cholesterol. Over time and in combination with inflammation and other factors, this contributes to plaque which in turn may lead to stroke and/or heart attack down the road. Sometimes high LDL is primarily genetic, and people might be eating all the right foods but still have high numbers. Other times, there is room for improvement in one's diet and eating healthier can bring this number down and potentially reduce one's risk of heart attack and/or stroke.   To reduce your LDL, Remember - more fruits and vegetables, more fish, and limit red meat and dairy products. More soy, nuts, beans, barley, lentils, oats and plant sterol ester enriched margarine instead of butter. I also encourage eliminating sugar and processed food. Remember, shop on the outside of the grocery store and visit your [employer]. If you would like to talk with me about dietary changes for your cholesterol, please let me know. We should recheck your cholesterol in 12 months. - Remainder of labs stable.  Any questions? Keep being amazing!!  Thank you for allowing me to participate in your  care.  I appreciate you. Kindest regards, Adyn Serna

## 2022-06-10 DIAGNOSIS — F3289 Other specified depressive episodes: Secondary | ICD-10-CM | POA: Diagnosis not present

## 2022-06-23 ENCOUNTER — Other Ambulatory Visit: Payer: Self-pay | Admitting: *Deleted

## 2022-06-23 ENCOUNTER — Telehealth: Payer: Self-pay | Admitting: *Deleted

## 2022-06-23 DIAGNOSIS — Z1211 Encounter for screening for malignant neoplasm of colon: Secondary | ICD-10-CM

## 2022-06-23 MED ORDER — NA SULFATE-K SULFATE-MG SULF 17.5-3.13-1.6 GM/177ML PO SOLN: 1.0000 | Freq: Once | ORAL | 0 refills | Status: AC

## 2022-06-23 NOTE — Telephone Encounter (Signed)
Gastroenterology Pre-Procedure Review  Request Date: 07/24/2022 Requesting Physician: Dr. Allen Norris  PATIENT REVIEW QUESTIONS: The patient responded to the following health history questions as indicated:    1. Are you having any GI issues? no 2. Do you have a personal history of Polyps? no 3. Do you have a family history of Colon Cancer or Polyps? no 4. Diabetes Mellitus? no 5. Joint replacements in the past 12 months?no 6. Major health problems in the past 3 months?no 7. Any artificial heart valves, MVP, or defibrillator?no    MEDICATIONS & ALLERGIES:    Patient reports the following regarding taking any anticoagulation/antiplatelet therapy:   Plavix, Coumadin, Eliquis, Xarelto, Lovenox, Pradaxa, Brilinta, or Effient? no Aspirin? no  Patient confirms/reports the following medications:  Current Outpatient Medications  Medication Sig Dispense Refill   cetirizine (ZYRTEC) 10 MG tablet Take 10 mg by mouth daily.     DULoxetine (CYMBALTA) 60 MG capsule TAKE (1) CAPSULE BY MOUTH EVERY DAY 90 capsule 4   metoprolol tartrate (LOPRESSOR) 25 MG tablet TAKE (1) TABLET BY MOUTH TWICE DAILY 180 tablet 4   rOPINIRole (REQUIP) 0.25 MG tablet TAKE ONE TABLET BY MOUTH AT BEDTIME. 90 tablet 1   SUMAtriptan (IMITREX) 50 MG tablet TAKE 1 TABLET ONCE AS NEEDED FOR MIGRAINE FOR 1 DOSE...  MAY REPEAT A SECOND AFTER 2 HOURS IF NEEDED... 10 tablet 12   No current facility-administered medications for this visit.    Patient confirms/reports the following allergies:  No Known Allergies  No orders of the defined types were placed in this encounter.   AUTHORIZATION INFORMATION Primary Insurance: 1D#: Group #:  Secondary Insurance: 1D#: Group #:  SCHEDULE INFORMATION: Date: 07/24/2022 Time: Location: [facility]

## 2022-06-25 ENCOUNTER — Ambulatory Visit
Admission: RE | Admit: 2022-06-25 | Discharge: 2022-06-25 | Disposition: A | Discharge status: Home / Self Care | Payer: BC Managed Care – PPO | Source: Ambulatory Visit | Attending: Nurse Practitioner | Admitting: Nurse Practitioner

## 2022-06-25 DIAGNOSIS — Z1231 Encounter for screening mammogram for malignant neoplasm of breast: Secondary | ICD-10-CM | POA: Insufficient documentation

## 2022-07-01 ENCOUNTER — Inpatient Hospital Stay
Admission: RE | Admit: 2022-07-01 | Discharge: 2022-07-01 | Disposition: A | Discharge status: Home / Self Care | Payer: Self-pay | Source: Ambulatory Visit | Attending: *Deleted | Admitting: *Deleted

## 2022-07-01 ENCOUNTER — Other Ambulatory Visit: Payer: Self-pay | Admitting: *Deleted

## 2022-07-01 ENCOUNTER — Encounter: Payer: Self-pay | Admitting: Nurse Practitioner

## 2022-07-01 ENCOUNTER — Other Ambulatory Visit: Payer: Self-pay | Admitting: Nurse Practitioner

## 2022-07-01 DIAGNOSIS — Z1231 Encounter for screening mammogram for malignant neoplasm of breast: Secondary | ICD-10-CM

## 2022-07-01 DIAGNOSIS — N63 Unspecified lump in unspecified breast: Secondary | ICD-10-CM

## 2022-07-01 DIAGNOSIS — R928 Other abnormal and inconclusive findings on diagnostic imaging of breast: Secondary | ICD-10-CM

## 2022-07-01 NOTE — Progress Notes (Signed)
Contacted via [place] morning Zaylei, your imaging has returned and looks like you need a repeat mammogram and ultrasound to further assess right breast.  Please reach out to [facility] at # below to schedule these:) Please call to schedule your mammogram and/or bone density: [facility] at [place]: [address], [place], [place] [address] Phone: [phone]  [place] [facility] [address] [place],  [place]  [address] Phone: [phone]

## 2022-07-13 ENCOUNTER — Ambulatory Visit
Admission: RE | Admit: 2022-07-13 | Discharge: 2022-07-13 | Disposition: A | Discharge status: Home / Self Care | Payer: BC Managed Care – PPO | Source: Ambulatory Visit | Attending: Nurse Practitioner | Admitting: Nurse Practitioner

## 2022-07-13 DIAGNOSIS — R928 Other abnormal and inconclusive findings on diagnostic imaging of breast: Secondary | ICD-10-CM | POA: Diagnosis not present

## 2022-07-13 DIAGNOSIS — N63 Unspecified lump in unspecified breast: Secondary | ICD-10-CM | POA: Diagnosis not present

## 2022-07-13 DIAGNOSIS — N6311 Unspecified lump in the right breast, upper outer quadrant: Secondary | ICD-10-CM | POA: Diagnosis not present

## 2022-07-22 DIAGNOSIS — F3289 Other specified depressive episodes: Secondary | ICD-10-CM | POA: Diagnosis not present

## 2022-07-23 ENCOUNTER — Encounter: Payer: Self-pay | Admitting: Gastroenterology

## 2022-07-24 ENCOUNTER — Other Ambulatory Visit: Payer: Self-pay

## 2022-07-24 ENCOUNTER — Ambulatory Visit: Payer: BC Managed Care – PPO | Admitting: Anesthesiology

## 2022-07-24 ENCOUNTER — Encounter
Admission: RE | Disposition: A | Discharge status: Home / Self Care | Payer: Self-pay | Source: Home / Self Care | Attending: Gastroenterology

## 2022-07-24 ENCOUNTER — Encounter: Payer: Self-pay | Admitting: Gastroenterology

## 2022-07-24 ENCOUNTER — Ambulatory Visit
Admission: RE | Admit: 2022-07-24 | Discharge: 2022-07-24 | Disposition: A | Discharge status: Home / Self Care | Payer: BC Managed Care – PPO | Attending: Gastroenterology | Admitting: Gastroenterology

## 2022-07-24 DIAGNOSIS — Z1211 Encounter for screening for malignant neoplasm of colon: Secondary | ICD-10-CM | POA: Diagnosis not present

## 2022-07-24 DIAGNOSIS — F32A Depression, unspecified: Secondary | ICD-10-CM | POA: Diagnosis not present

## 2022-07-24 DIAGNOSIS — K5730 Diverticulosis of large intestine without perforation or abscess without bleeding: Secondary | ICD-10-CM | POA: Insufficient documentation

## 2022-07-24 DIAGNOSIS — E785 Hyperlipidemia, unspecified: Secondary | ICD-10-CM | POA: Diagnosis not present

## 2022-07-24 HISTORY — PX: COLONOSCOPY WITH PROPOFOL: SHX5780

## 2022-07-24 LAB — POCT PREGNANCY, URINE: Preg Test, Ur: NEGATIVE

## 2022-07-24 SURGERY — COLONOSCOPY WITH PROPOFOL
Anesthesia: General | Site: Rectum

## 2022-07-24 MED ORDER — LACTATED RINGERS IV SOLN: INTRAVENOUS | Status: DC

## 2022-07-24 MED ORDER — PROPOFOL 10 MG/ML IV BOLUS
INTRAVENOUS | Status: DC | PRN
  Administered 2022-07-24: 40 mg via INTRAVENOUS
  Administered 2022-07-24: 80 mg via INTRAVENOUS
  Administered 2022-07-24: 40 mg via INTRAVENOUS
  Administered 2022-07-24: 60 mg via INTRAVENOUS
  Administered 2022-07-24: 40 mg via INTRAVENOUS

## 2022-07-24 MED ORDER — STERILE WATER FOR IRRIGATION IR SOLN
Status: DC | PRN
  Administered 2022-07-24: 150 mL

## 2022-07-24 MED ORDER — SODIUM CHLORIDE 0.9 % IV SOLN: INTRAVENOUS | Status: DC

## 2022-07-24 MED ORDER — LIDOCAINE HCL (CARDIAC) PF 100 MG/5ML IV SOSY
PREFILLED_SYRINGE | INTRAVENOUS | Status: DC | PRN
  Administered 2022-07-24: 80 mg via INTRAVENOUS

## 2022-07-24 MED ORDER — ACETAMINOPHEN 160 MG/5ML PO SOLN: 325.0000 mg | ORAL | Status: DC | PRN

## 2022-07-24 MED ORDER — ACETAMINOPHEN 325 MG PO TABS: 650.0000 mg | ORAL_TABLET | Freq: Once | ORAL | Status: DC | PRN

## 2022-07-24 MED ORDER — ONDANSETRON HCL 4 MG/2ML IJ SOLN: 4.0000 mg | Freq: Once | INTRAMUSCULAR | Status: DC | PRN

## 2022-07-24 SURGICAL SUPPLY — 21 items

## 2022-07-24 NOTE — H&P (Signed)
Tammy Lame, MD [facility] [address], [place], [place] [address] Phone: [phone] Fax : [phone]  Primary Care Physician:  Venita Lick, NP Primary Gastroenterologist:  Dr. Allen Norris  Pre-Procedure History & Physical: HPI:  Tammy Stout is a 47 y.o. female is here for a screening colonoscopy.   Past Medical History:  Diagnosis Date   Depression    Dermatitis 2012   Eczema    Perioral dermatitis    Tendonitis    right hand    Past Surgical History:  Procedure Laterality Date   CESAREAN SECTION  05/2006   SECTION DUE TO NO DIALATIONA AND CORD WAS AROUND BABY NECK   FOOT TENDON SURGERY  06/03/2019   Plantar fascitis    Prior to Admission medications   Medication Sig Start Date End Date Taking? Authorizing Provider  cetirizine (ZYRTEC) 10 MG tablet Take 10 mg by mouth daily.   Yes [provider]  DULoxetine (CYMBALTA) 60 MG capsule TAKE (1) CAPSULE BY MOUTH EVERY DAY 06/08/22  Yes Cannady, Jolene T, NP  metoprolol tartrate (LOPRESSOR) 25 MG tablet TAKE (1) TABLET BY MOUTH TWICE DAILY 06/08/22  Yes Cannady, Jolene T, NP  rOPINIRole (REQUIP) 0.25 MG tablet TAKE ONE TABLET BY MOUTH AT BEDTIME. 01/26/20  Yes Johnson, Megan P, DO  SUMAtriptan (IMITREX) 50 MG tablet TAKE 1 TABLET ONCE AS NEEDED FOR MIGRAINE FOR 1 DOSE...  MAY REPEAT A SECOND AFTER 2 HOURS IF NEEDED... 06/08/22  Yes Cannady, Jolene T, NP  VITAMIN D PO Take by mouth daily.   Yes [provider]    Allergies as of 06/23/2022   (No Known Allergies)    Family History  Problem Relation Age of Onset   Breast cancer Mother 29   Diabetes Mother    Cancer Mother 25       One breast   Thyroid disease Father    Hyperlipidemia Father    Thyroid disease Maternal Aunt    Cancer Maternal Grandmother 68       lung cancer   Diabetes Maternal Grandmother    Diabetes Maternal Grandfather    Cancer Paternal Grandmother 51       kidney cancer    Social History   Socioeconomic  History   Marital status: Married    Spouse name: Not on file   Number of children: Not on file   Years of education: Not on file   Highest education level: Not on file  Occupational History   Not on file  Tobacco Use   Smoking status: Never   Smokeless tobacco: Never  Vaping Use   Vaping Use: Never used  Substance and Sexual Activity   Alcohol use: Yes    Comment: social   Drug use: No   Sexual activity: Yes    Birth control/protection: I.U.D.  Other Topics Concern   Not on file  Social History Narrative   Not on file   Social Determinants of Health   Financial Resource Strain: Low Risk  (06/03/2021)   Overall Financial Resource Strain (CARDIA)    Difficulty of Paying Living Expenses: Not hard at all  Food Insecurity: No Food Insecurity (06/03/2021)   Hunger Vital Sign    Worried About Running Out of Food in the Last Year: Never true    Ran Out of Food in the Last Year: Never true  Transportation Needs: No Transportation Needs (06/03/2021)   PRAPARE - Hydrologist (Medical): No    Lack  of Transportation (Non-Medical): No  Physical Activity: Sufficiently Active (06/03/2021)   Exercise Vital Sign    Days of Exercise per Week: 4 days    Minutes of Exercise per Session: 40 min  Stress: No Stress Concern Present (06/03/2021)   [facility]    Feeling of Stress : Only a little  Social Connections: Moderately Isolated (06/03/2021)   Social Connection and Isolation Panel [NHANES]    Frequency of Communication with Friends and Family: More than three times a week    Frequency of Social Gatherings with Friends and Family: More than three times a week    Attends Religious Services: Never    Marine scientist or Organizations: No    Attends Archivist Meetings: Never    Marital Status: Married  Human resources officer Violence: Not At Risk (06/03/2021)   Humiliation, Afraid,  Rape, and Kick questionnaire    Fear of Current or Ex-Partner: No    Emotionally Abused: No    Physically Abused: No    Sexually Abused: No    Review of Systems: See HPI, otherwise negative ROS  Physical Exam: BP (!) 125/58   Temp 98.6 F (37 C) (Tympanic)   Resp (!) 8   Ht 5' 5.75" (1.67 m)   Wt 93.9 kg   LMP 04/22/2022 (Approximate) Comment: Perimenopausal  SpO2 99%   BMI 33.67 kg/m  General:   Alert,  pleasant and cooperative in NAD Head:  Normocephalic and atraumatic. Neck:  Supple; no masses or thyromegaly. Lungs:  Clear throughout to auscultation.    Heart:  Regular rate and rhythm. Abdomen:  Soft, nontender and nondistended. Normal bowel sounds, without guarding, and without rebound.   Neurologic:  Alert and  oriented x4;  grossly normal neurologically.  Impression/Plan: [name] is now here to undergo a screening colonoscopy.  Risks, benefits, and alternatives regarding colonoscopy have been reviewed with the patient.  Questions have been answered.  All parties agreeable.

## 2022-07-24 NOTE — Transfer of Care (Signed)
Immediate Anesthesia Transfer of Care Note  Patient: Tammy Stout  Procedure(s) Performed: COLONOSCOPY WITH PROPOFOL (Rectum)  Patient Location: PACU  Anesthesia Type: General  Level of Consciousness: awake, alert  and patient cooperative  Airway and Oxygen Therapy: Patient Spontanous Breathing and Patient connected to supplemental oxygen  Post-op Assessment: Post-op Vital signs reviewed, Patient's Cardiovascular Status Stable, Respiratory Function Stable, Patent Airway and No signs of Nausea or vomiting  Post-op Vital Signs: Reviewed and stable  Complications: No notable events documented.

## 2022-07-24 NOTE — Op Note (Signed)
[facility] Patient Name: Tammy Stout Procedure Date: 07/24/2022 9:39 AM MRN: [id number] Account #: [id number] Date of Birth: April 02, 1976 Admit Type: Outpatient Age: 47 Room: [facility] OR ROOM 01 Gender: Female Note Status: Finalized Instrument Name: [id number] Procedure:             Colonoscopy Indications:           Screening for colorectal malignant neoplasm Providers:             [name] MD, MD Referring MD:          Barbaraann Faster. Cannady (Referring MD) Medicines:             Propofol per Anesthesia Complications:         No immediate complications. Procedure:             Pre-Anesthesia Assessment:                        - Prior to the procedure, a History and Physical was                         performed, and patient medications and allergies were                         reviewed. The patient's tolerance of previous                         anesthesia was also reviewed. The risks and benefits                         of the procedure and the sedation options and risks                         were discussed with the patient. All questions were                         answered, and informed consent was obtained. Prior                         Anticoagulants: The patient has taken no anticoagulant                         or antiplatelet agents. ASA Grade Assessment: II - A                         patient with mild systemic disease. After reviewing                         the risks and benefits, the patient was deemed in                         satisfactory condition to undergo the procedure.                        After obtaining informed consent, the colonoscope was                         passed under direct vision. Throughout the procedure,  the patient's blood pressure, pulse, and oxygen                         saturations were monitored continuously. The                         Colonoscope was introduced through the anus and                          advanced to the the cecum, identified by appendiceal                         orifice and ileocecal valve. The colonoscopy was                         performed without difficulty. The patient tolerated                         the procedure well. The quality of the bowel                         preparation was excellent. Findings:      The perianal and digital rectal examinations were normal.      A few small-mouthed diverticula were found in the sigmoid colon. Impression:            - Diverticulosis in the sigmoid colon.                        - No specimens collected. Recommendation:        - Discharge patient to home.                        - Resume previous diet.                        - Continue present medications.                        - Repeat colonoscopy in 10 years for screening                         purposes. Procedure Code(s):     --- Professional ---                        [id number], Colonoscopy, flexible; diagnostic, including                         collection of specimen(s) by brushing or washing, when                         performed (separate procedure) Diagnosis Code(s):     --- Professional ---                        Z12.11, Encounter for screening for malignant neoplasm                         of colon CPT copyright 2022 American Medical Association. All rights reserved. The codes documented in this report are preliminary and upon coder review may  be revised to meet current compliance requirements. [name] MD, MD 07/24/2022 10:38:39 AM This report has been signed electronically. Number of Addenda: 0 Note Initiated On: 07/24/2022 9:39 AM Scope Withdrawal Time: 0 hours 8 minutes 46 seconds  Total Procedure Duration: 0 hours 12 minutes 28 seconds  Estimated Blood Loss:  Estimated blood loss: none.      [facility]

## 2022-07-24 NOTE — Anesthesia Preprocedure Evaluation (Addendum)
Anesthesia Evaluation  Patient identified by MRN, date of birth, ID band Patient awake    Reviewed: Allergy & Precautions, NPO status , Patient's Chart, lab work & pertinent test results  History of Anesthesia Complications Negative for: history of anesthetic complications  Airway Mallampati: I   Neck ROM: Full    Dental no notable dental hx.    Pulmonary neg pulmonary ROS   Pulmonary exam normal breath sounds clear to auscultation       Cardiovascular Exercise Tolerance: Good negative cardio ROS Normal cardiovascular exam Rhythm:Regular Rate:Normal     Neuro/Psych  PSYCHIATRIC DISORDERS  Depression    negative neurological ROS     GI/Hepatic negative GI ROS,,,  Endo/Other  Obesity   Renal/GU negative Renal ROS     Musculoskeletal   Abdominal   Peds  Hematology negative hematology ROS (+)   Anesthesia Other Findings   Reproductive/Obstetrics                             Anesthesia Physical Anesthesia Plan  ASA: 2  Anesthesia Plan: General   Post-op Pain Management:    Induction: Intravenous  PONV Risk Score and Plan: 3 and Propofol infusion, TIVA and Treatment may vary due to age or medical condition  Airway Management Planned: Natural Airway  Additional Equipment:   Intra-op Plan:   Post-operative Plan:   Informed Consent: I have reviewed the patients History and Physical, chart, labs and discussed the procedure including the risks, benefits and alternatives for the proposed anesthesia with the patient or authorized representative who has indicated his/her understanding and acceptance.       Plan Discussed with: CRNA  Anesthesia Plan Comments: (LMA/GETA backup discussed.  Patient consented for risks of anesthesia including but not limited to:  - adverse reactions to medications - damage to eyes, teeth, lips or other oral mucosa - nerve damage due to positioning  -  sore throat or hoarseness - damage to heart, brain, nerves, lungs, other parts of body or loss of life  Informed patient about role of CRNA in peri- and intra-operative care.  Patient voiced understanding.)        Anesthesia Quick Evaluation

## 2022-07-24 NOTE — Anesthesia Postprocedure Evaluation (Signed)
Anesthesia Post Note  Patient: Tammy Stout  Procedure(s) Performed: COLONOSCOPY WITH PROPOFOL (Rectum)  Patient location during evaluation: PACU Anesthesia Type: General Level of consciousness: awake and alert, oriented and patient cooperative Pain management: pain level controlled Vital Signs Assessment: post-procedure vital signs reviewed and stable Respiratory status: spontaneous breathing, nonlabored ventilation and respiratory function stable Cardiovascular status: blood pressure returned to baseline and stable Postop Assessment: adequate PO intake Anesthetic complications: no   No notable events documented.   Last Vitals:  Vitals:   07/24/22 1039 07/24/22 1045  BP: 118/76 128/69  Pulse: 100 89  Resp: 15 11  Temp: (!) 36.4 C (!) 36.4 C  SpO2: 99% 98%    Last Pain:  Vitals:   07/24/22 1045  TempSrc:   PainSc: 0-No pain                 Darrin Nipper

## 2022-07-27 ENCOUNTER — Encounter: Payer: Self-pay | Admitting: Gastroenterology

## 2022-08-17 DIAGNOSIS — F3289 Other specified depressive episodes: Secondary | ICD-10-CM | POA: Diagnosis not present

## 2022-09-03 ENCOUNTER — Telehealth: Payer: Self-pay | Admitting: Nurse Practitioner

## 2022-09-03 DIAGNOSIS — F325 Major depressive disorder, single episode, in full remission: Secondary | ICD-10-CM

## 2022-09-03 NOTE — Telephone Encounter (Signed)
Copied from [place] [id number]. Topic: Referral - Request for Referral >> Sep 03, 2022 10:58 AM Everette C wrote: Has patient seen PCP for this complaint? Yes.   *If NO, is insurance requiring patient see PCP for this issue before PCP can refer them? Referral for which specialty: Psychiatry  Preferred provider/office: [place], [place]  Reason for referral: behavioral health care / medication maintenance   Phone [phone] Fax [phone]

## 2022-09-07 DIAGNOSIS — F3289 Other specified depressive episodes: Secondary | ICD-10-CM | POA: Diagnosis not present

## 2022-10-05 DIAGNOSIS — F3289 Other specified depressive episodes: Secondary | ICD-10-CM | POA: Diagnosis not present

## 2022-12-21 DIAGNOSIS — F3289 Other specified depressive episodes: Secondary | ICD-10-CM | POA: Diagnosis not present

## 2022-12-28 ENCOUNTER — Ambulatory Visit: Payer: Self-pay

## 2022-12-28 NOTE — Telephone Encounter (Signed)
      Chief Complaint: Rash to both arms, small red dots.Itchy Symptoms: Above Frequency: 1 month ago Pertinent Negatives: Patient denies any new soaps, lotions Disposition: [] ED /[] Urgent Care (no appt availability in office) / [x] Appointment(In office/virtual)/ []  [place] [facility]/ [] Home Care/ [] Refused Recommended Disposition /[] [facility]/ []  Follow-up with PCP Additional Notes:   Reason for Disposition  Localized rash present > 7 days  Answer Assessment - Initial Assessment Questions 1. APPEARANCE of RASH: "Describe the rash."      Tiny red bumps 2. LOCATION: "Where is the rash located?"      Both arms 3. NUMBER: "How many spots are there?"      Many 4. SIZE: "How big are the spots?" (Inches, centimeters or compare to size of a coin)      Tip of pen 5. ONSET: "When did the rash start?"      1 month ago 6. ITCHING: "Does the rash itch?" If Yes, ask: "How bad is the itch?"  (Scale 0-10; or none, mild, moderate, severe)     Itchy 7. PAIN: "Does the rash hurt?" If Yes, ask: "How bad is the pain?"  (Scale 0-10; or none, mild, moderate, severe)    - NONE (0): no pain    - MILD (1-3): doesn't interfere with normal activities     - MODERATE (4-7): interferes with normal activities or awakens from sleep     - SEVERE (8-10): excruciating pain, unable to do any normal activities     No 8. OTHER SYMPTOMS: "Do you have any other symptoms?" (e.g., fever)     No 9. PREGNANCY: "Is there any chance you are pregnant?" "When was your last menstrual period?"     No  Protocols used: Rash or Redness - Localized-A-AH

## 2022-12-31 ENCOUNTER — Ambulatory Visit: Payer: BC Managed Care – PPO | Admitting: Physician Assistant

## 2022-12-31 ENCOUNTER — Encounter: Payer: Self-pay | Admitting: Physician Assistant

## 2022-12-31 VITALS — BP 89/56 | HR 72 | Temp 97.9°F | Ht 65.0 in | Wt 192.0 lb

## 2022-12-31 DIAGNOSIS — B86 Scabies: Secondary | ICD-10-CM | POA: Diagnosis not present

## 2022-12-31 DIAGNOSIS — R21 Rash and other nonspecific skin eruption: Secondary | ICD-10-CM

## 2022-12-31 MED ORDER — TRIAMCINOLONE ACETONIDE 0.5 % EX OINT: 1.0000 | TOPICAL_OINTMENT | Freq: Two times a day (BID) | CUTANEOUS | 0 refills | Status: DC

## 2022-12-31 MED ORDER — PERMETHRIN 5 % EX CREA: TOPICAL_CREAM | CUTANEOUS | 1 refills | Status: DC

## 2022-12-31 NOTE — Progress Notes (Signed)
Acute Office Visit   Patient: Tammy Stout   DOB: 13-May-1976   47 y.o. Female  MRN: [id number] Visit Date: 12/31/2022  Today's healthcare provider: Oswaldo Conroy Ricahrd Schwager, PA-C  Introduced myself to the patient as a Secondary school teacher and provided education on APPs in clinical practice.    Chief Complaint  Patient presents with   Rash    Patient says she notices a rash that comes and goes and says she only notices it on the back of her knees and arms. Patient says she has tried over the counter Gold Bond cream. Patient says it helps a little with the itching. Patient says she has also Benadryl cream and capsule and that is not helping.    Subjective    HPI HPI     Rash    Additional comments: Patient says she notices a rash that comes and goes and says she only notices it on the back of her knees and arms. Patient says she has tried over the counter Gold Bond cream. Patient says it helps a little with the itching. Patient says she has also Benadryl cream and capsule and that is not helping.       Last edited by Malen Gauze, CMA on 12/31/2022  9:39 AM.      Addison Bailey Duration:  weeks - ongoing for about a month - intermittent flares  Location: trunk, hands, arms, and legs  Itching: yes Burning: no Redness: yes Oozing: no Scaling: no Blisters: no- but appear mildly vesicular  Painful: no Fevers: no Change in detergents/soaps/personal care products: no Recent illness: no Recent travel:no History of same: no Context: fluctuating Alleviating factors: nothing Treatments attempted:hydrocortisone cream, benadryl, and lotion/moisturizer Shortness of breath: no  Throat/tongue swelling: no Myalgias/arthralgias: no   Medications: Outpatient Medications Prior to Visit  Medication Sig   cetirizine (ZYRTEC) 10 MG tablet Take 10 mg by mouth daily.   DULoxetine (CYMBALTA) 60 MG capsule TAKE (1) CAPSULE BY MOUTH EVERY DAY   metoprolol tartrate (LOPRESSOR) 25 MG tablet TAKE (1) TABLET BY  MOUTH TWICE DAILY   rOPINIRole (REQUIP) 0.25 MG tablet TAKE ONE TABLET BY MOUTH AT BEDTIME.   SUMAtriptan (IMITREX) 50 MG tablet TAKE 1 TABLET ONCE AS NEEDED FOR MIGRAINE FOR 1 DOSE...  MAY REPEAT A SECOND AFTER 2 HOURS IF NEEDED...   VITAMIN D PO Take by mouth daily.   No facility-administered medications prior to visit.    Review of Systems  Constitutional:  Negative for chills and fever.  HENT:  Negative for facial swelling.   Respiratory:  Negative for cough, shortness of breath and wheezing.   Cardiovascular:  Negative for chest pain.  Musculoskeletal:  Negative for myalgias.  Skin:  Positive for rash.       Objective    BP (!) 89/56 (BP Location: Left Arm, Cuff Size: Normal)   Pulse 72   Temp 97.9 F (36.6 C) (Oral)   Ht 5\' 5"  (1.651 m)   Wt 192 lb (87.1 kg)   SpO2 97%   BMI 31.95 kg/m    Physical Exam Vitals reviewed.  Constitutional:      General: She is awake.     Appearance: Normal appearance. She is well-developed and well-groomed.  HENT:     Head: Normocephalic and atraumatic.  Pulmonary:     Effort: Pulmonary effort is normal.  Skin:    General: Skin is warm and dry.     Coloration: Skin is not ashen.  Findings: Rash present. Rash is macular and papular.     Comments: Patient demonstrated  several small (1 mm) lesions that are maculopapular in nature along her forearms and behind her knees   Neurological:     General: No focal deficit present.     Mental Status: She is alert and oriented to person, place, and time.     GCS: GCS eye subscore is 4. GCS verbal subscore is 5. GCS motor subscore is 6.  Psychiatric:        Attention and Perception: Attention and perception normal.        Mood and Affect: Mood and affect normal.        Speech: Speech normal.        Behavior: Behavior normal. Behavior is cooperative.       No results found for any visits on 12/31/22.  Assessment & Plan      No follow-ups on file.      Problem List Items  Addressed This Visit   None Visit Diagnoses     Scabies    -  Primary Acute, new concern Rash appears consistent with scabies- small maculopapular lesions with tunneling on right forearm Will send in Permethrin cream and discussed administration instructions Reviewed treating home and linens  Will also provide kenalog cream to assist with itching  She voiced understanding and agreement with plan Follow up as needed for persistent or progressing symptoms     Relevant Medications   permethrin (ELIMITE) 5 % cream   Rash and nonspecific skin eruption       Relevant Medications   triamcinolone ointment (KENALOG) 0.5 %        No follow-ups on file.   I, Makyiah Lie E Rayla Pember, PA-C, have reviewed all documentation for this visit. The documentation on 12/31/22 for the exam, diagnosis, procedures, and orders are all accurate and complete.   Jacquelin Hawking, MHS, PA-C [facility]

## 2023-01-05 ENCOUNTER — Other Ambulatory Visit: Payer: Self-pay | Admitting: Nurse Practitioner

## 2023-01-05 DIAGNOSIS — N63 Unspecified lump in unspecified breast: Secondary | ICD-10-CM

## 2023-01-05 DIAGNOSIS — R928 Other abnormal and inconclusive findings on diagnostic imaging of breast: Secondary | ICD-10-CM

## 2023-01-15 ENCOUNTER — Ambulatory Visit
Admission: RE | Admit: 2023-01-15 | Discharge: 2023-01-15 | Disposition: A | Discharge status: Home / Self Care | Payer: BC Managed Care – PPO | Source: Ambulatory Visit | Attending: Nurse Practitioner | Admitting: Nurse Practitioner

## 2023-01-15 DIAGNOSIS — N63 Unspecified lump in unspecified breast: Secondary | ICD-10-CM | POA: Insufficient documentation

## 2023-01-15 DIAGNOSIS — N6311 Unspecified lump in the right breast, upper outer quadrant: Secondary | ICD-10-CM | POA: Diagnosis not present

## 2023-01-15 DIAGNOSIS — R92321 Mammographic fibroglandular density, right breast: Secondary | ICD-10-CM | POA: Diagnosis not present

## 2023-01-15 DIAGNOSIS — R928 Other abnormal and inconclusive findings on diagnostic imaging of breast: Secondary | ICD-10-CM | POA: Insufficient documentation

## 2023-01-16 NOTE — Progress Notes (Signed)
Contacted via MyChart   Good morning Tammy Stout, the area of concern on imaging to right breast appears to be a benign (non cancerous) cyst.  They recommend you return for your annual screening in January 2025.:)

## 2023-01-22 ENCOUNTER — Ambulatory Visit (INDEPENDENT_AMBULATORY_CARE_PROVIDER_SITE_OTHER): Payer: BC Managed Care – PPO | Admitting: Family Medicine

## 2023-01-22 ENCOUNTER — Ambulatory Visit: Payer: Self-pay

## 2023-01-22 ENCOUNTER — Encounter: Payer: Self-pay | Admitting: Family Medicine

## 2023-01-22 VITALS — BP 101/68 | HR 91 | Ht 65.0 in | Wt 190.4 lb

## 2023-01-22 DIAGNOSIS — R252 Cramp and spasm: Secondary | ICD-10-CM | POA: Insufficient documentation

## 2023-01-22 NOTE — Patient Instructions (Signed)
Drink more water at least 80 oz daily Try 1 glass of tonic water daily

## 2023-01-22 NOTE — Progress Notes (Signed)
BP 101/68   Pulse 91   Ht 5\' 5"  (1.651 m)   Wt 190 lb 6.4 oz (86.4 kg)   SpO2 97%   BMI 31.68 kg/m    Subjective:    Patient ID: Tammy Stout, female    DOB: Oct 29, 1975, 47 y.o.   MRN: [id number]  HPI: Tammy Stout is a 47 y.o. female  Chief Complaint  Patient presents with   Leg Problem    Patient says she has been having cramps in her calf muscles for about a month now. Patient says she has been using Advil to help after she notices a bad one. Patient says she notices the cramps mainly at night. Patient says the only changes she has had is recently starting Vitamin D and starting the Keto diet back in April.    LEG CRAMPS Happens mostly at night but sometimes during the day. She is drinking 3 cups of water daily, consuming low carb diet, more vegetables than fruits. She is not physically active. Duration:1 months Pain: yes Severity: 10/10  Quality:  cramping Location:  calves Bilateral:  yes, sometimes the left calf, mostly the right calf Onset: sudden Frequency: x2 weekly Time of  day:    Morning before getting out of bed  and at night Sudden unintentional leg jerking:   no Paresthesias:   no Decreased sensation:  no Weakness:   no Insomnia:   no Fatigue:   no Alleviating factors: Pressure Aggravating factors: none Status: stable Treatments attempted: Advil for soreness   Relevant past medical, surgical, family and social history reviewed and updated as indicated. Interim medical history since our last visit reviewed. Allergies and medications reviewed and updated.  Review of Systems  Respiratory: Negative.    Cardiovascular: Negative.   Musculoskeletal:  Positive for myalgias. Negative for arthralgias, gait problem and joint swelling.  Hematological:  Does not bruise/bleed easily.    Per HPI unless specifically indicated above     Objective:    BP 101/68   Pulse 91   Ht 5\' 5"  (1.651 m)   Wt 190 lb 6.4 oz (86.4 kg)   SpO2 97%   BMI 31.68  kg/m   Wt Readings from Last 3 Encounters:  01/22/23 190 lb 6.4 oz (86.4 kg)  12/31/22 192 lb (87.1 kg)  07/24/22 207 lb (93.9 kg)    Physical Exam Vitals and nursing note reviewed.  Constitutional:      General: She is awake. She is not in acute distress.    Appearance: Normal appearance. She is well-developed and well-groomed. She is not ill-appearing.  HENT:     Head: Normocephalic and atraumatic.     Right Ear: Hearing and external ear normal. No drainage.     Left Ear: Hearing and external ear normal. No drainage.     Nose: Nose normal.  Eyes:     General: Lids are normal.        Right eye: No discharge.        Left eye: No discharge.     Conjunctiva/sclera: Conjunctivae normal.  Cardiovascular:     Rate and Rhythm: Normal rate and regular rhythm.     Pulses:          Radial pulses are 2+ on the right side and 2+ on the left side.       Femoral pulses are 2+ on the right side and 2+ on the left side.      Dorsalis pedis pulses are 2+ on the  right side and 2+ on the left side.       Posterior tibial pulses are 2+ on the right side and 2+ on the left side.     Heart sounds: Normal heart sounds, S1 normal and S2 normal. No murmur heard.    No gallop.  Pulmonary:     Effort: Pulmonary effort is normal. No accessory muscle usage or respiratory distress.     Breath sounds: Normal breath sounds.  Musculoskeletal:        General: Normal range of motion.     Cervical back: Full passive range of motion without pain and normal range of motion.     Right lower leg: No tenderness. No edema.     Left lower leg: No tenderness. No edema.  Skin:    General: Skin is warm and dry.     Capillary Refill: Capillary refill takes less than 2 seconds.  Neurological:     Mental Status: She is alert and oriented to person, place, and time.  Psychiatric:        Attention and Perception: Attention normal.        Mood and Affect: Mood normal.        Speech: Speech normal.        Behavior:  Behavior normal. Behavior is cooperative.        Thought Content: Thought content normal.     Results for orders placed or performed during the hospital encounter of 07/24/22  Pregnancy, urine POC  Result Value Ref Range   Preg Test, Ur NEGATIVE NEGATIVE      Assessment & Plan:   Problem List Items Addressed This Visit     Leg cramps - Primary    Acute, stable. Checking CMP, Mg, Phos,and CBC today. Will treat based on results. Recommend increase water intake to 80 oz daily and 1 glass of tonic water. Return if symptoms worsen.       Relevant Orders   Comp Met (CMET)   CBC w/Diff   Magnesium   Phosphorus     Follow up plan: Return in about 1 month (around 02/22/2023) for Flu shot.

## 2023-01-22 NOTE — Assessment & Plan Note (Signed)
Acute, stable. Checking CMP, Mg, Phos,and CBC today. Will treat based on results. Recommend increase water intake to 80 oz daily and 1 glass of tonic water. Return if symptoms worsen.

## 2023-01-22 NOTE — Telephone Encounter (Signed)
     Chief Complaint: Bilateral leg cramps, worse on right calf. Comes and goes. Symptoms: Above Frequency: 1 month Pertinent Negatives: Patient denies swelling or redness. Disposition: [] ED /[] Urgent Care (no appt availability in office) / [x] Appointment(In office/virtual)/ []  [place] [facility]/ [] Home Care/ [] Refused Recommended Disposition /[] [place] [facility]/ []  Follow-up with PCP Additional Notes: Pt. Agrees with appointment.  Reason for Disposition  [1] SEVERE pain (e.g., excruciating, unable to do any normal activities) AND [2] not improved after 2 hours of pain medicine  Answer Assessment - Initial Assessment Questions 1. ONSET: "When did the pain start?"      1 month ago 2. LOCATION: "Where is the pain located?"      Both calves 3. PAIN: "How bad is the pain?"    (Scale 1-10; or mild, moderate, severe)   -  MILD (1-3): doesn't interfere with normal activities    -  MODERATE (4-7): interferes with normal activities (e.g., work or school) or awakens from sleep, limping    -  SEVERE (8-10): excruciating pain, unable to do any normal activities, unable to walk     Varies 4. WORK OR EXERCISE: "Has there been any recent work or exercise that involved this part of the body?"      No 5. CAUSE: "What do you think is causing the leg pain?"     Cramps 6. OTHER SYMPTOMS: "Do you have any other symptoms?" (e.g., chest pain, back pain, breathing difficulty, swelling, rash, fever, numbness, weakness)     No 7. PREGNANCY: "Is there any chance you are pregnant?" "When was your last menstrual period?"     No  Protocols used: Leg Pain-A-AH

## 2023-01-25 DIAGNOSIS — F3289 Other specified depressive episodes: Secondary | ICD-10-CM | POA: Diagnosis not present

## 2023-01-25 NOTE — Progress Notes (Signed)
Hi Tammy Stout, your electrolytes, kidney function, liver function, phosphorous, and magnesium came back normal. There are no signs of anemia. Continue drinking plenty of water. Thank you for allowing me to participate in your care.

## 2023-02-23 ENCOUNTER — Ambulatory Visit: Payer: BC Managed Care – PPO

## 2023-03-18 DIAGNOSIS — F3289 Other specified depressive episodes: Secondary | ICD-10-CM | POA: Diagnosis not present

## 2023-03-25 ENCOUNTER — Other Ambulatory Visit: Payer: Self-pay | Admitting: Nurse Practitioner

## 2023-03-25 ENCOUNTER — Other Ambulatory Visit: Payer: Self-pay

## 2023-03-25 MED ORDER — METOPROLOL TARTRATE 25 MG PO TABS: ORAL_TABLET | ORAL | 4 refills | Status: DC

## 2023-03-25 MED ORDER — DULOXETINE HCL 60 MG PO CPEP: ORAL_CAPSULE | ORAL | 1 refills | Status: DC

## 2023-03-25 MED ORDER — DULOXETINE HCL 60 MG PO CPEP: ORAL_CAPSULE | ORAL | 0 refills | Status: DC

## 2023-03-25 MED ORDER — DULOXETINE HCL 60 MG PO CPEP: ORAL_CAPSULE | ORAL | 4 refills | Status: DC

## 2023-03-25 NOTE — Telephone Encounter (Signed)
Medication Refill - Medication:  DULoxetine (CYMBALTA) 60 MG capsule   Patient's husband states that this refill is supposed to be sent through the mail order pharmacy that they were supposed to request this prescription but patient doesn't have enough to wait for the mail order, needs a short supply like 7-10 day supply until mail order comes.  *Not completely out, has 3 days left  Has the patient contacted their pharmacy? Yes  Preferred Pharmacy (with phone number or street name):  [id number] - [place],  - [address] AT [facility] [address] [facility] [[id number]]    Has the patient been seen for an appointment in the last year OR does the patient have an upcoming appointment? Yes. F/U on 12.23.2024

## 2023-03-25 NOTE — Telephone Encounter (Signed)
Requested Prescriptions  Pending Prescriptions Disp Refills   DULoxetine (CYMBALTA) 60 MG capsule 90 capsule 0    Sig: TAKE (1) CAPSULE BY MOUTH EVERY DAY     Psychiatry: Antidepressants - SNRI - duloxetine Passed - 03/25/2023  4:27 PM      Passed - Cr in normal range and within 360 days    Creatinine  Date Value Ref Range Status  06/25/2012 0.67 0.60 - 1.30 mg/dL Final   Creatinine, Ser  Date Value Ref Range Status  01/22/2023 0.77 0.57 - 1.00 mg/dL Final         Passed - eGFR is 30 or above and within 360 days    EGFR (African American)  Date Value Ref Range Status  06/25/2012 >60  Final   GFR calc Af Amer  Date Value Ref Range Status  05/03/2020 115 >59 mL/min/1.73 Final    Comment:    **In accordance with recommendations from the NKF-ASN Task force,**   [facility] is in the process of updating its eGFR calculation to the   2021 CKD-EPI creatinine equation that estimates kidney function   without a race variable.    EGFR (Non-African Amer.)  Date Value Ref Range Status  06/25/2012 >60  Final    Comment:    eGFR values <80mL/min/1.73 m2 may be an indication of chronic kidney disease (CKD). Calculated eGFR is useful in patients with stable renal function. The eGFR calculation will not be reliable in acutely ill patients when serum creatinine is changing rapidly. It is not useful in  patients on dialysis. The eGFR calculation may not be applicable to patients at the low and high extremes of body sizes, pregnant women, and vegetarians.    GFR calc non Af Amer  Date Value Ref Range Status  05/03/2020 100 >59 mL/min/1.73 Final   eGFR  Date Value Ref Range Status  01/22/2023 96 >59 mL/min/1.73 Final         Passed - Completed PHQ-2 or PHQ-9 in the last 360 days      Passed - Last BP in normal range    BP Readings from Last 1 Encounters:  01/22/23 101/68         Passed - Valid encounter within last 6 months    Recent Outpatient Visits           2 months ago  Leg cramps   [facility] [place], Sherran Needs, NP   2 months ago Scabies   [place] [facility] Mecum, Erin E, PA-C   9 months ago Major depressive disorder with single episode, in full remission (HCC)   [place] [facility] [place], Corrie Dandy T, NP   1 year ago Subconjunctival hemorrhage of left eye   [place] [facility] Mecum, Erin E, PA-C   1 year ago Major depressive disorder with single episode, in full remission (HCC)   [place] [facility] [place], Dorie Rank, NP       Future Appointments             In 2 months Cannady, Dorie Rank, NP [place] [facility], [facility]

## 2023-03-25 NOTE — Telephone Encounter (Signed)
Patient called, no answer, mailbox full. Patient's husband, on DPR, called to get clarity on the previous request. He says he needs duloxetine sent to [facility] and a short supply to [place]. Advised this will be done.

## 2023-03-25 NOTE — Addendum Note (Signed)
Addended by: Wilford Corner on: 03/25/2023 04:33 PM   Modules accepted: Orders

## 2023-03-25 NOTE — Addendum Note (Signed)
Addended by: Wilford Corner on: 03/25/2023 04:30 PM   Modules accepted: Orders

## 2023-03-26 ENCOUNTER — Other Ambulatory Visit: Payer: Self-pay | Admitting: Nurse Practitioner

## 2023-03-26 MED ORDER — DULOXETINE HCL 60 MG PO CPEP: ORAL_CAPSULE | ORAL | 0 refills | Status: DC

## 2023-03-26 NOTE — Telephone Encounter (Signed)
Pt's husband stated the short supply of medication DULoxetine (CYMBALTA) 60 MG capsule sent to [facility] needs to be [id number] [place], [place], as insurance will not cover it.  Husband mentioned he did not care for a callback; he just wants this taken care of before the weekend.  [id number] [place], [place] - [address]  [address] [place] [address]  Phone: [phone] Fax: [phone]  Hours: Not open 24 hours   Please advise.

## 2023-03-26 NOTE — Telephone Encounter (Signed)
Requested Prescriptions  Pending Prescriptions Disp Refills   DULoxetine (CYMBALTA) 60 MG capsule 10 capsule 0    Sig: TAKE (1) CAPSULE BY MOUTH EVERY DAY     Psychiatry: Antidepressants - SNRI - duloxetine Passed - 03/26/2023  5:21 PM      Passed - Cr in normal range and within 360 days    Creatinine  Date Value Ref Range Status  06/25/2012 0.67 0.60 - 1.30 mg/dL Final   Creatinine, Ser  Date Value Ref Range Status  01/22/2023 0.77 0.57 - 1.00 mg/dL Final         Passed - eGFR is 30 or above and within 360 days    EGFR (African American)  Date Value Ref Range Status  06/25/2012 >60  Final   GFR calc Af Amer  Date Value Ref Range Status  05/03/2020 115 >59 mL/min/1.73 Final    Comment:    **In accordance with recommendations from the NKF-ASN Task force,**   [facility] is in the process of updating its eGFR calculation to the   2021 CKD-EPI creatinine equation that estimates kidney function   without a race variable.    EGFR (Non-African Amer.)  Date Value Ref Range Status  06/25/2012 >60  Final    Comment:    eGFR values <38mL/min/1.73 m2 may be an indication of chronic kidney disease (CKD). Calculated eGFR is useful in patients with stable renal function. The eGFR calculation will not be reliable in acutely ill patients when serum creatinine is changing rapidly. It is not useful in  patients on dialysis. The eGFR calculation may not be applicable to patients at the low and high extremes of body sizes, pregnant women, and vegetarians.    GFR calc non Af Amer  Date Value Ref Range Status  05/03/2020 100 >59 mL/min/1.73 Final   eGFR  Date Value Ref Range Status  01/22/2023 96 >59 mL/min/1.73 Final         Passed - Completed PHQ-2 or PHQ-9 in the last 360 days      Passed - Last BP in normal range    BP Readings from Last 1 Encounters:  01/22/23 101/68         Passed - Valid encounter within last 6 months    Recent Outpatient Visits           2 months ago  Leg cramps   [place] [facility] [place], Sherran Needs, NP   2 months ago Scabies   [place] [facility] Mecum, Erin E, PA-C   9 months ago Major depressive disorder with single episode, in full remission (HCC)   [place] [facility] [place], Corrie Dandy T, NP   1 year ago Subconjunctival hemorrhage of left eye   [place] [facility] Mecum, Erin E, PA-C   1 year ago Major depressive disorder with single episode, in full remission (HCC)   [place] [facility] [place], Dorie Rank, NP       Future Appointments             In 2 months Cannady, Dorie Rank, NP [facility], [facility]

## 2023-03-30 ENCOUNTER — Other Ambulatory Visit: Payer: Self-pay

## 2023-03-30 DIAGNOSIS — F411 Generalized anxiety disorder: Secondary | ICD-10-CM | POA: Diagnosis not present

## 2023-03-30 DIAGNOSIS — F330 Major depressive disorder, recurrent, mild: Secondary | ICD-10-CM | POA: Diagnosis not present

## 2023-03-30 MED ORDER — METOPROLOL TARTRATE 25 MG PO TABS: ORAL_TABLET | ORAL | 4 refills | Status: DC

## 2023-03-30 NOTE — Telephone Encounter (Signed)
Requesting RX be sent via mail order.

## 2023-04-12 DIAGNOSIS — F3289 Other specified depressive episodes: Secondary | ICD-10-CM | POA: Diagnosis not present

## 2023-04-19 ENCOUNTER — Encounter: Payer: Self-pay | Admitting: Physician Assistant

## 2023-04-19 ENCOUNTER — Ambulatory Visit (INDEPENDENT_AMBULATORY_CARE_PROVIDER_SITE_OTHER): Payer: BC Managed Care – PPO | Admitting: Physician Assistant

## 2023-04-19 VITALS — BP 108/74 | HR 90 | Temp 98.2°F | Resp 16 | Ht 65.0 in | Wt 185.3 lb

## 2023-04-19 DIAGNOSIS — B029 Zoster without complications: Secondary | ICD-10-CM | POA: Diagnosis not present

## 2023-04-19 DIAGNOSIS — L0100 Impetigo, unspecified: Secondary | ICD-10-CM | POA: Diagnosis not present

## 2023-04-19 MED ORDER — MUPIROCIN 2 % EX OINT: 1.0000 | TOPICAL_OINTMENT | Freq: Two times a day (BID) | CUTANEOUS | 0 refills | Status: DC

## 2023-04-19 NOTE — Progress Notes (Signed)
Acute Office Visit   Patient: Tammy Stout   DOB: October 06, 1975   47 y.o. Female  MRN: [id number] Visit Date: 04/19/2023  Today's healthcare provider: Oswaldo Conroy Norma Montemurro, PA-C  Introduced myself to the patient as a Secondary school teacher and provided education on APPs in clinical practice.    Chief Complaint  Patient presents with   Rash    Near right side of mouth, onset for 5 weeks still sore and itchy. Has been seen twice through virtual visits and given treatment no help   Subjective    HPI HPI     Rash    Additional comments: Near right side of mouth, onset for 5 weeks still sore and itchy. Has been seen twice through virtual visits and given treatment no help      Last edited by Forde Radon, CMA on 04/19/2023  1:34 PM.      Patient reports persistent rash along the right side of her mouth for the past 5 weeks She reports she was seen virtually on two different occasions and prescribed Valtrex oral 2 times and acyclovir as a topical treatment but rash is still present   She reports she did have fatigue, nerve pain when she initially had rash She reports nerve pain and fatigue have resolved but she is still having other symptoms  She reports she is still having some pain,itching, soreness and the rash does not appear to be improving despite 2 rounds of antiviral medications She thinks blisters/vesicles are still sometimes forming but she is not completely sure   She reports there is still some crusting of the rash- white to yellowish in appearance She states she has a hx of perioral dermatitis as well   Medications: Outpatient Medications Prior to Visit  Medication Sig   cetirizine (ZYRTEC) 10 MG tablet Take 10 mg by mouth daily.   DULoxetine (CYMBALTA) 60 MG capsule TAKE (1) CAPSULE BY MOUTH EVERY DAY   DULoxetine (CYMBALTA) 60 MG capsule TAKE (1) CAPSULE BY MOUTH EVERY DAY   metoprolol tartrate (LOPRESSOR) 25 MG tablet TAKE (1) TABLET BY MOUTH TWICE DAILY    permethrin (ELIMITE) 5 % cream Apply cream whole body, from head to toe at bedtime; leave on for 8 to 12 hours (overnight), wash off next day. Repeat in 14 days if needed.   rOPINIRole (REQUIP) 0.25 MG tablet TAKE ONE TABLET BY MOUTH AT BEDTIME.   SUMAtriptan (IMITREX) 50 MG tablet TAKE 1 TABLET ONCE AS NEEDED FOR MIGRAINE FOR 1 DOSE...  MAY REPEAT A SECOND AFTER 2 HOURS IF NEEDED...   triamcinolone ointment (KENALOG) 0.5 % Apply 1 Application topically 2 (two) times daily.   VITAMIN D PO Take by mouth daily.   No facility-administered medications prior to visit.    Review of Systems  Skin:  Positive for rash.        Objective    BP 108/74   Pulse 90   Temp 98.2 F (36.8 C) (Oral)   Resp 16   Ht 5\' 5"  (1.651 m)   Wt 185 lb 4.8 oz (84.1 kg)   SpO2 98%   BMI 30.84 kg/m     Physical Exam Vitals reviewed.  Constitutional:      General: She is awake.     Appearance: Normal appearance. She is well-developed and well-groomed.  HENT:     Head: Normocephalic and atraumatic.      Mouth/Throat:     Lips: Pink.  Mouth: Mucous membranes are moist.  Neurological:     Mental Status: She is alert.  Psychiatric:        Behavior: Behavior is cooperative.       No results found for any visits on 04/19/23.  Assessment & Plan      No follow-ups on file.      Problem List Items Addressed This Visit   None Visit Diagnoses     Impetigo    -  Primary Acute, ongoing Patient has been treated with several rounds of Valtrex for herpes zoster along with topical acyclovir. She does not appear to have active vesicles but there is still a raised erythematous rash along the right side of her mouth that remains painful and itching She does report some intermittent yellowish discharge and crusting that might correspond to impetigo infection Will try mupirocin ointment to assist with resolution.  Also recommend using occlusive moisturizers such as Aquaphor or Vaseline to further  assist with itching and moisture improvement If this does not lead to improvement in symptoms will refer to dermatology.  I have already placed referral with hopes that she will not have to wait as long to be seen. Reviewed return precautions Follow-up as needed for progressing or persistent symptoms   Relevant Medications   mupirocin ointment (BACTROBAN) 2 %   Herpes zoster without complication       Relevant Medications   mupirocin ointment (BACTROBAN) 2 %   Other Relevant Orders   Ambulatory referral to Dermatology        No follow-ups on file.   I, Kegan Mckeithan E Kurstin Dimarzo, PA-C, have reviewed all documentation for this visit. The documentation on 04/19/23 for the exam, diagnosis, procedures, and orders are all accurate and complete.   Jacquelin Hawking, MHS, PA-C [facility]

## 2023-04-19 NOTE — Patient Instructions (Signed)
I recommend using the Mupirocin ointment to assist with potential bacterial infection of the area  You can also use thick ointments like Aquaphor or Vaseline to assist with moisturizing the area Please keep the area clean with gentle soap and warm water and pat to dry  If your symptoms are not improving I recommend that we try referring you to Dermatology for further evaluation. I have placed a referral for this so we can have it ready should it be needed.

## 2023-04-21 NOTE — Addendum Note (Signed)
Addended by: Jacquelin Hawking on: 04/21/2023 09:24 AM   Modules accepted: Orders

## 2023-04-26 DIAGNOSIS — F330 Major depressive disorder, recurrent, mild: Secondary | ICD-10-CM | POA: Diagnosis not present

## 2023-04-26 DIAGNOSIS — F411 Generalized anxiety disorder: Secondary | ICD-10-CM | POA: Diagnosis not present

## 2023-05-11 ENCOUNTER — Encounter: Payer: Self-pay | Admitting: Physician Assistant

## 2023-05-11 ENCOUNTER — Ambulatory Visit (INDEPENDENT_AMBULATORY_CARE_PROVIDER_SITE_OTHER): Payer: BC Managed Care – PPO | Admitting: Physician Assistant

## 2023-05-11 VITALS — BP 102/64 | HR 99 | Temp 97.5°F | Resp 16 | Ht 65.0 in | Wt 183.7 lb

## 2023-05-11 DIAGNOSIS — R21 Rash and other nonspecific skin eruption: Secondary | ICD-10-CM

## 2023-05-11 DIAGNOSIS — L0100 Impetigo, unspecified: Secondary | ICD-10-CM | POA: Diagnosis not present

## 2023-05-11 MED ORDER — PREDNISONE 20 MG PO TABS: ORAL_TABLET | ORAL | 0 refills | Status: DC

## 2023-05-11 MED ORDER — SULFAMETHOXAZOLE-TRIMETHOPRIM 800-160 MG PO TABS: 1.0000 | ORAL_TABLET | Freq: Two times a day (BID) | ORAL | 0 refills | Status: AC

## 2023-05-11 NOTE — Progress Notes (Unsigned)
Acute Office Visit   Patient: Tammy Stout   DOB: May 15, 1976   47 y.o. Female  MRN: [id number] Visit Date: 05/11/2023  Today's healthcare provider: Oswaldo Conroy Malkia Nippert, PA-C  Introduced myself to the patient as a Secondary school teacher and provided education on APPs in clinical practice.    Chief Complaint  Patient presents with   Rash    Face/legs onset for weeks, red and itchy   Subjective    HPI HPI     Rash    Additional comments: Face/legs onset for weeks, red and itchy      Last edited by Forde Radon, CMA on 05/11/2023  2:07 PM.      Ongoing rash Was treated for Impetigo with mupirocin - reports marked improvement but no resolution while using topical  States she completed full 10 days of ointment  She is interested in trying oral abx to assist with resolution at this time  She reports itching was better while using mupirocin but has returned and is back to the baseline from previous   She has now developed rash on her lower legs She states the rash on legs is almost unbearably itchy   She has apt with Derm in Jan    Medications: Outpatient Medications Prior to Visit  Medication Sig   cetirizine (ZYRTEC) 10 MG tablet Take 10 mg by mouth daily.   DULoxetine (CYMBALTA) 60 MG capsule TAKE (1) CAPSULE BY MOUTH EVERY DAY   DULoxetine (CYMBALTA) 60 MG capsule TAKE (1) CAPSULE BY MOUTH EVERY DAY   metoprolol tartrate (LOPRESSOR) 25 MG tablet TAKE (1) TABLET BY MOUTH TWICE DAILY   mupirocin ointment (BACTROBAN) 2 % Apply 1 Application topically 2 (two) times daily.   permethrin (ELIMITE) 5 % cream Apply cream whole body, from head to toe at bedtime; leave on for 8 to 12 hours (overnight), wash off next day. Repeat in 14 days if needed.   rOPINIRole (REQUIP) 0.25 MG tablet TAKE ONE TABLET BY MOUTH AT BEDTIME.   SUMAtriptan (IMITREX) 50 MG tablet TAKE 1 TABLET ONCE AS NEEDED FOR MIGRAINE FOR 1 DOSE...  MAY REPEAT A SECOND AFTER 2 HOURS IF NEEDED...   triamcinolone  ointment (KENALOG) 0.5 % Apply 1 Application topically 2 (two) times daily.   VITAMIN D PO Take by mouth daily.   No facility-administered medications prior to visit.    Review of Systems  Skin:  Positive for rash.    {Insert previous labs (optional):23779} {See past labs  Heme  Chem  Endocrine  Serology  Results Review (optional):1}   Objective    BP 102/64   Pulse 99   Temp (!) 97.5 F (36.4 C) (Temporal)   Resp 16   Ht 5\' 5"  (1.651 m)   Wt 183 lb 11.2 oz (83.3 kg)   SpO2 99%   BMI 30.57 kg/m  {Insert last BP/Wt (optional):23777}{See vitals history (optional):1}   Physical Exam Vitals reviewed.  Constitutional:      General: She is awake.     Appearance: Normal appearance. She is well-developed and well-groomed.  HENT:     Head: Normocephalic and atraumatic.  Eyes:     General: Lids are normal. Gaze aligned appropriately.     Extraocular Movements: Extraocular movements intact.     Conjunctiva/sclera: Conjunctivae normal.  Pulmonary:     Effort: Pulmonary effort is normal.  Musculoskeletal:     Cervical back: Normal range of motion.  Skin:    General: Skin  is warm and dry.     Findings: Rash present. Rash is papular.     Comments: Multiple papular lesions present along bilateral ankles - no tracts or tunneling appreciated on exam, no swelling, erythematous base or drainage noted   Papular lesions with erythematous base  noted along right side of mouth - mild fluctuant appearance for some lesions but no active drainage or crusts   Neurological:     Mental Status: She is alert.  Psychiatric:        Mood and Affect: Mood normal.        Behavior: Behavior normal. Behavior is cooperative.        Thought Content: Thought content normal.        Judgment: Judgment normal.       No results found for any visits on 05/11/23.  Assessment & Plan      No follow-ups on file.       Problem List Items Addressed This Visit   None Visit Diagnoses      Impetigo    -  Primary   Relevant Medications   predniSONE (DELTASONE) 20 MG tablet   sulfamethoxazole-trimethoprim (BACTRIM DS) 800-160 MG tablet   Rash and other nonspecific skin eruption       Relevant Medications   sulfamethoxazole-trimethoprim (BACTRIM DS) 800-160 MG tablet        No follow-ups on file.   I, Sanyia Dini E Reedy Biernat, PA-C, have reviewed all documentation for this visit. The documentation on 05/11/23 for the exam, diagnosis, procedures, and orders are all accurate and complete.   Jacquelin Hawking, MHS, PA-C [facility]

## 2023-05-13 DIAGNOSIS — F3289 Other specified depressive episodes: Secondary | ICD-10-CM | POA: Diagnosis not present

## 2023-06-12 DIAGNOSIS — E559 Vitamin D deficiency, unspecified: Secondary | ICD-10-CM | POA: Insufficient documentation

## 2023-06-14 ENCOUNTER — Other Ambulatory Visit: Payer: Self-pay | Admitting: Nurse Practitioner

## 2023-06-14 ENCOUNTER — Telehealth: Payer: Self-pay | Admitting: Nurse Practitioner

## 2023-06-14 ENCOUNTER — Encounter: Payer: BC Managed Care – PPO | Admitting: Nurse Practitioner

## 2023-06-14 DIAGNOSIS — Z862 Personal history of diseases of the blood and blood-forming organs and certain disorders involving the immune mechanism: Secondary | ICD-10-CM

## 2023-06-14 DIAGNOSIS — F325 Major depressive disorder, single episode, in full remission: Secondary | ICD-10-CM

## 2023-06-14 DIAGNOSIS — E782 Mixed hyperlipidemia: Secondary | ICD-10-CM

## 2023-06-14 DIAGNOSIS — G43829 Menstrual migraine, not intractable, without status migrainosus: Secondary | ICD-10-CM

## 2023-06-14 DIAGNOSIS — Z23 Encounter for immunization: Secondary | ICD-10-CM

## 2023-06-14 DIAGNOSIS — G2581 Restless legs syndrome: Secondary | ICD-10-CM

## 2023-06-14 DIAGNOSIS — R7301 Impaired fasting glucose: Secondary | ICD-10-CM

## 2023-06-14 DIAGNOSIS — E559 Vitamin D deficiency, unspecified: Secondary | ICD-10-CM

## 2023-06-14 DIAGNOSIS — Z0000 Encounter for general adult medical examination without abnormal findings: Secondary | ICD-10-CM

## 2023-06-14 DIAGNOSIS — E6609 Other obesity due to excess calories: Secondary | ICD-10-CM

## 2023-06-14 MED ORDER — METOPROLOL TARTRATE 25 MG PO TABS: ORAL_TABLET | ORAL | 0 refills | Status: DC

## 2023-06-14 NOTE — Telephone Encounter (Signed)
Pt has an appointment scheduled on 07/20/2023 @ 1:20 pm.

## 2023-06-14 NOTE — Telephone Encounter (Signed)
Noted  

## 2023-06-14 NOTE — Telephone Encounter (Signed)
metoprolol tartrate (LOPRESSOR) 25 MG tablet    Medication was sent to wrong pharmacy, it needs to be sent to:  [facility]

## 2023-06-14 NOTE — Telephone Encounter (Signed)
Requested Prescriptions  Pending Prescriptions Disp Refills   metoprolol tartrate (LOPRESSOR) 25 MG tablet 180 tablet 0    Sig: TAKE (1) TABLET BY MOUTH TWICE DAILY     Cardiovascular:  Beta Blockers Passed - 06/14/2023  2:47 PM      Passed - Last BP in normal range    BP Readings from Last 1 Encounters:  05/11/23 102/64         Passed - Last Heart Rate in normal range    Pulse Readings from Last 1 Encounters:  05/11/23 99         Passed - Valid encounter within last 6 months    Recent Outpatient Visits           1 month ago Impetigo   [place] [facility] Mecum, Oswaldo Conroy, PA-C   1 month ago Impetigo   [facility] [facility] Mecum, Oswaldo Conroy, PA-C   4 months ago Leg cramps   [place] [facility] [place], Sherran Needs, NP   5 months ago Scabies   [facility] Mecum, Oswaldo Conroy, PA-C   1 year ago Major depressive disorder with single episode, in full remission [facility])   [place] [facility] [place], Dorie Rank, NP       Future Appointments             In 3 weeks Elie Goody, MD [facility]   In 1 month [place], Dorie Rank, NP [place]

## 2023-06-14 NOTE — Telephone Encounter (Signed)
Prescription Request  06/14/2023  LOV: Visit date not found  What is the name of the medication or equipment? metoprolol tartrate (LOPRESSOR) 25 MG tablet   Have you contacted your pharmacy to request a refill? No   Which pharmacy would you like this sent to?    [facility] - [place], [place] - [address] [place] [address] Phone: [phone] Fax: [phone]   Patient notified that their request is being sent to the clinical staff for review and that they should receive a response within 2 business days.   Please advise at Mobile [phone] (mobile)

## 2023-06-24 DIAGNOSIS — F3289 Other specified depressive episodes: Secondary | ICD-10-CM | POA: Diagnosis not present

## 2023-07-05 ENCOUNTER — Ambulatory Visit: Payer: BC Managed Care – PPO | Admitting: Dermatology

## 2023-07-17 NOTE — Patient Instructions (Signed)
Be Involved in Caring For Your Health:  Taking Medications When medications are taken as directed, they can greatly improve your health. But if they are not taken as prescribed, they may not work. In some cases, not taking them correctly can be harmful. To help ensure your treatment remains effective and safe, understand your medications and how to take them. Bring your medications to each visit for review by your provider.  Your lab results, notes, and after visit summary will be available on My Chart. We strongly encourage you to use this feature. If lab results are abnormal the clinic will contact you with the appropriate steps. If the clinic does not contact you assume the results are satisfactory. You can always view your results on My Chart. If you have questions regarding your health or results, please contact the clinic during office hours. You can also ask questions on My Chart.  We at [facility] are grateful that you chose [place] to provide your care. We strive to provide evidence-based and compassionate care and are always looking for feedback. If you get a survey from the clinic please complete this so we can hear your opinions.  Healthy Eating, Adult Healthy eating may help you get and keep a healthy body weight, reduce the risk of chronic disease, and live a long and productive life. It is important to follow a healthy eating pattern. Your nutritional and calorie needs should be met mainly by different nutrient-rich foods. What are tips for following this plan? Reading food labels Read labels and choose the following: Reduced or low sodium products. Juices with 100% fruit juice. Foods with low saturated fats (<3 g per serving) and high polyunsaturated and monounsaturated fats. Foods with whole grains, such as whole wheat, cracked wheat, brown rice, and wild rice. Whole grains that are fortified with folic acid. This is recommended for females who are pregnant or who want  to become pregnant. Read labels and do not eat or drink the following: Foods or drinks with added sugars. These include foods that contain brown sugar, corn sweetener, corn syrup, dextrose, fructose, glucose, high-fructose corn syrup, honey, invert sugar, lactose, malt syrup, maltose, molasses, raw sugar, sucrose, trehalose, or turbinado sugar. Limit your intake of added sugars to less than 10% of your total daily calories. Do not eat more than the following amounts of added sugar per day: 6 teaspoons (25 g) for females. 9 teaspoons (38 g) for males. Foods that contain processed or refined starches and grains. Refined grain products, such as white flour, degermed cornmeal, white bread, and white rice. Shopping Choose nutrient-rich snacks, such as vegetables, whole fruits, and nuts. Avoid high-calorie and high-sugar snacks, such as potato chips, fruit snacks, and candy. Use oil-based dressings and spreads on foods instead of solid fats such as butter, margarine, sour cream, or cream cheese. Limit pre-made sauces, mixes, and "instant" products such as flavored rice, instant noodles, and ready-made pasta. Try more plant-protein sources, such as tofu, tempeh, black beans, edamame, lentils, nuts, and seeds. Explore eating plans such as the Mediterranean diet or vegetarian diet. Try heart-healthy dips made with beans and healthy fats like hummus and guacamole. Vegetables go great with these. Cooking Use oil to saut or stir-fry foods instead of solid fats such as butter, margarine, or lard. Try baking, boiling, grilling, or broiling instead of frying. Remove the fatty part of meats before cooking. Steam vegetables in water or broth. Meal planning  At meals, imagine dividing your plate into fourths: One-half of  your plate is fruits and vegetables. One-fourth of your plate is whole grains. One-fourth of your plate is protein, especially lean meats, poultry, eggs, tofu, beans, or nuts. Include  low-fat dairy as part of your daily diet. Lifestyle Choose healthy options in all settings, including home, work, school, restaurants, or stores. Prepare your food safely: Wash your hands after handling raw meats. Where you prepare food, keep surfaces clean by regularly washing with hot, soapy water. Keep raw meats separate from ready-to-eat foods, such as fruits and vegetables. Cook seafood, meat, poultry, and eggs to the recommended temperature. Get a food thermometer. Store foods at safe temperatures. In general: Keep cold foods at 76F (4.4C) or below. Keep hot foods at 176F (60C) or above. Keep your freezer at [facility] (-17.8C) or below. Foods are not safe to eat if they have been between the temperatures of 40-176F (4.4-60C) for more than 2 hours. What foods should I eat? Fruits Aim to eat 1-2 cups of fresh, canned (in natural juice), or frozen fruits each day. One cup of fruit equals 1 small apple, 1 large banana, 8 large strawberries, 1 cup (237 g) canned fruit,  cup (82 g) dried fruit, or 1 cup (240 mL) 100% juice. Vegetables Aim to eat 2-4 cups of fresh and frozen vegetables each day, including different varieties and colors. One cup of vegetables equals 1 cup (91 g) broccoli or cauliflower florets, 2 medium carrots, 2 cups (150 g) raw, leafy greens, 1 large tomato, 1 large bell pepper, 1 large sweet potato, or 1 medium white potato. Grains Aim to eat 5-10 ounce-equivalents of whole grains each day. Examples of 1 ounce-equivalent of grains include 1 slice of bread, 1 cup (40 g) ready-to-eat cereal, 3 cups (24 g) popcorn, or  cup (93 g) cooked rice. Meats and other proteins Try to eat 5-7 ounce-equivalents of protein each day. Examples of 1 ounce-equivalent of protein include 1 egg,  oz nuts (12 almonds, 24 pistachios, or 7 walnut halves), 1/4 cup (90 g) cooked beans, 6 tablespoons (90 g) hummus or 1 tablespoon (16 g) peanut butter. A cut of meat or fish that is the size of a deck  of cards is about 3-4 ounce-equivalents (85 g). Of the protein you eat each week, try to have at least 8 sounce (227 g) of seafood. This is about 2 servings per week. This includes salmon, trout, herring, sardines, and anchovies. Dairy Aim to eat 3 cup-equivalents of fat-free or low-fat dairy each day. Examples of 1 cup-equivalent of dairy include 1 cup (240 mL) milk, 8 ounces (250 g) yogurt, 1 ounces (44 g) natural cheese, or 1 cup (240 mL) fortified soy milk. Fats and oils Aim for about 5 teaspoons (21 g) of fats and oils per day. Choose monounsaturated fats, such as canola and olive oils, mayonnaise made with olive oil or avocado oil, avocados, peanut butter, and most nuts, or polyunsaturated fats, such as sunflower, corn, and soybean oils, walnuts, pine nuts, sesame seeds, sunflower seeds, and flaxseed. Beverages Aim for 6 eight-ounce glasses of water per day. Limit coffee to 3-5 eight-ounce cups per day. Limit caffeinated beverages that have added calories, such as soda and energy drinks. If you drink alcohol: Limit how much you have to: 0-1 drink a day if you are female. 0-2 drinks a day if you are female. Know how much alcohol is in your drink. In the [place] one drink is one 12 oz bottle of beer (355 mL), one 5 oz glass of wine (  148 mL), or one 1 oz glass of hard liquor (44 mL). Seasoning and other foods Try not to add too much salt to your food. Try using herbs and spices instead of salt. Try not to add sugar to food. This information is based on [place] nutrition guidelines. To learn more, visit DisposableNylon.be. Exact amounts may vary. You may need different amounts. This information is not intended to replace advice given to you by your health care provider. Make sure you discuss any questions you have with your health care provider. Document Revised: 03/09/2022 Document Reviewed: 03/09/2022 Elsevier Patient Education  2024 [employer].

## 2023-07-19 ENCOUNTER — Ambulatory Visit (INDEPENDENT_AMBULATORY_CARE_PROVIDER_SITE_OTHER): Payer: BC Managed Care – PPO | Admitting: Dermatology

## 2023-07-19 ENCOUNTER — Encounter: Payer: Self-pay | Admitting: Dermatology

## 2023-07-19 DIAGNOSIS — L2989 Other pruritus: Secondary | ICD-10-CM | POA: Diagnosis not present

## 2023-07-19 DIAGNOSIS — L299 Pruritus, unspecified: Secondary | ICD-10-CM

## 2023-07-19 DIAGNOSIS — L710 Perioral dermatitis: Secondary | ICD-10-CM | POA: Diagnosis not present

## 2023-07-19 DIAGNOSIS — D2372 Other benign neoplasm of skin of left lower limb, including hip: Secondary | ICD-10-CM

## 2023-07-19 DIAGNOSIS — D239 Other benign neoplasm of skin, unspecified: Secondary | ICD-10-CM

## 2023-07-19 MED ORDER — DOXYCYCLINE MONOHYDRATE 100 MG PO CAPS: 100.0000 mg | ORAL_CAPSULE | Freq: Two times a day (BID) | ORAL | 1 refills | Status: AC

## 2023-07-19 NOTE — Patient Instructions (Addendum)
Start doxycycline 100 mg twice daily with food x 2 months.   Doxycycline should be taken with food to prevent nausea. Do not lay down for 30 minutes after taking. Be cautious with sun exposure and use good sun protection while on this medication. Pregnant women should not take this medication.     Due to recent changes in healthcare laws, you may see results of your pathology and/or laboratory studies on MyChart before the doctors have had a chance to review them. We understand that in some cases there may be results that are confusing or concerning to you. Please understand that not all results are received at the same time and often the doctors may need to interpret multiple results in order to provide you with the best plan of care or course of treatment. Therefore, we ask that you please give [place] 2 business days to thoroughly review all your results before contacting the office for clarification. Should we see a critical lab result, you will be contacted sooner.   If You Need Anything After Your Visit  If you have any questions or concerns for your doctor, please call our main line at [phone] and press option 4 to reach your doctor's medical assistant. If no one answers, please leave a voicemail as directed and we will return your call as soon as possible. Messages left after 4 pm will be answered the following business day.   You may also send [place] a message via MyChart. We typically respond to MyChart messages within 1-2 business days.  For prescription refills, please ask your pharmacy to contact our office. Our fax number is [phone].  If you have an urgent issue when the clinic is closed that cannot wait until the next business day, you can page your doctor at the number below.    Please note that while we do our best to be available for urgent issues outside of office hours, we are not available 24/7.   If you have an urgent issue and are unable to reach [place], you may choose to  seek medical care at your doctor's office, retail clinic, urgent care center, or emergency room.  If you have a medical emergency, please immediately call 911 or go to the emergency department.  Pager Numbers  - Dr. Gwen Pounds: [phone]  - Dr. Roseanne Reno: [phone]  - Dr. Katrinka Blazing: [phone]   In the event of inclement weather, please call our main line at [phone] for an update on the status of any delays or closures.  Dermatology Medication Tips: Please keep the boxes that topical medications come in in order to help keep track of the instructions about where and how to use these. Pharmacies typically print the medication instructions only on the boxes and not directly on the medication tubes.   If your medication is too expensive, please contact our office at [phone] option 4 or send [place] a message through MyChart.   We are unable to tell what your co-pay for medications will be in advance as this is different depending on your insurance coverage. However, we may be able to find a substitute medication at lower cost or fill out paperwork to get insurance to cover a needed medication.   If a prior authorization is required to get your medication covered by your insurance company, please allow [place] 1-2 business days to complete this process.  Drug prices often vary depending on where the prescription is filled and some pharmacies may offer cheaper prices.  The website  www.goodrx.com contains coupons for medications through different pharmacies. The prices here do not account for what the cost may be with help from insurance (it may be cheaper with your insurance), but the website can give you the price if you did not use any insurance.  - You can print the associated coupon and take it with your prescription to the pharmacy.  - You may also stop by our office during regular business hours and pick up a GoodRx coupon card.  - If you need your prescription sent electronically to a  different pharmacy, notify our office through [facility] or by phone at [phone] option 4.     Si Usted Necesita Algo Despus de Su Visita  Tambin puede enviarnos un mensaje a travs de Clinical cytogeneticist. Por lo general respondemos a los mensajes de MyChart en el transcurso de 1 a 2 das hbiles.  Para renovar recetas, por favor pida a su farmacia que se ponga en contacto con nuestra oficina. Annie Sable de fax es [place] [phone].  Si tiene un asunto urgente cuando la clnica est cerrada y que no puede esperar hasta el siguiente da hbil, puede llamar/localizar a su doctor(a) al nmero que aparece a continuacin.   Por favor, tenga en cuenta que aunque hacemos todo lo posible para estar disponibles para asuntos urgentes fuera del horario de [place], no estamos disponibles las 24 horas del da, los 7 [address]  [address] de la [place].   Si tiene un problema urgente y no puede comunicarse con nosotros, puede optar por buscar atencin mdica  en el consultorio de su doctor(a), en una clnica privada, en un centro de atencin urgente o en una sala de emergencias.  Si tiene Engineer, drilling, por favor llame inmediatamente al 911 o vaya a la sala de emergencias.  Nmeros de bper  - Dr. Gwen Pounds: [phone]  - Dra. Roseanne Reno: [phone]  - Dr. Katrinka Blazing: [phone]   En caso de inclemencias del tiempo, por favor llame a Lacy Duverney principal al [phone] para una actualizacin sobre el [place] de cualquier retraso o cierre.  Consejos para la medicacin en dermatologa: Por favor, guarde las cajas en las que vienen los medicamentos de uso tpico para ayudarle a seguir las instrucciones sobre dnde y cmo usarlos. Las farmacias generalmente imprimen las instrucciones del medicamento slo en las cajas y no directamente en los tubos del [place].   Si su medicamento es muy caro, por favor, pngase en contacto con Rolm Gala llamando al [phone] y presione la opcin 4 o envenos un  mensaje a travs de Clinical cytogeneticist.   No podemos decirle cul ser su copago por los medicamentos por adelantado ya que esto es diferente dependiendo de la cobertura de su seguro. Sin embargo, es posible que podamos encontrar un medicamento sustituto a Audiological scientist un formulario para que el seguro cubra el medicamento que se considera necesario.   Si se requiere una autorizacin previa para que su compaa de seguros [place] su medicamento, por favor permtanos de 1 a 2 das hbiles para completar [address]  Los precios de los medicamentos varan con frecuencia dependiendo del Environmental consultant de dnde se surte la receta y alguna farmacias pueden ofrecer precios ms baratos.  El sitio web www.goodrx.com tiene cupones para medicamentos de Health and safety inspector. Los precios aqu no tienen en cuenta lo que podra costar con la ayuda del seguro (puede ser ms barato con su seguro), pero el sitio web puede darle el precio si no utiliz Tourist information centre manager.  - Puede  imprimir el cupn correspondiente y llevarlo con su receta a la farmacia.  - Tambin puede pasar por nuestra oficina durante el horario de atencin regular y Education officer, museum una tarjeta de cupones de GoodRx.  - Si necesita que su receta se enve electrnicamente a una farmacia diferente, informe a nuestra oficina a travs de MyChart de [place] o por telfono llamando al [phone] y presione la opcin 4.

## 2023-07-19 NOTE — Progress Notes (Signed)
   New Patient Visit   Subjective  Tammy Stout is a 48 y.o. female who presents for the following: probable perioral dermatitis. She was originally diagnosed with shingles and impetigo on 04/19/23 and was given mupirocin. Patient went back to PCP a few weeks later and was then given Bactrim and Prednisone. She had some improvement but once she finished oral treatment, rash came back.   Patient saw PCP 7/24 for itching and rash at left arm that came up after a bug bite and was given TMC 0.5% cr and permethrin. She has also used Benadryl topically and orally without improvment. Rash went away and itching improved but has not fully resolved. Nobody else in the home is itching.   Patient also has a spot at left lower leg that came up about 3 months ago and she would like it checked.   The patient has spots, moles and lesions to be evaluated, some may be new or changing and the patient may have concern these could be cancer.   The following portions of the chart were reviewed this encounter and updated as appropriate: medications, allergies, medical history  Review of Systems:  No other skin or systemic complaints except as noted in HPI or Assessment and Plan.  Objective  Well appearing patient in no apparent distress; mood and affect are within normal limits.   A focused examination was performed of the following areas: Left leg, face  Relevant exam findings are noted in the Assessment and Plan.  perioral Pink inflamed papules and pustules on perioral skin  Left Forearm Violaceous macule from arthropod bite on left distal forearm. No rash  Assessment & Plan   DERMATOFIBROMA Exam: Firm pink/brown papulenodule with dimple sign on left lower leg Treatment Plan: A dermatofibroma is a benign growth possibly related to trauma, such as an insect bite, cut from shaving, or inflamed acne-type bump.  Treatment options to remove include shave or excision with resulting scar and risk of  recurrence.  Since benign-appearing and not bothersome, will observe for now.    PERIORAL DERMATITIS perioral Start doxycycline 100 mg twice daily with food x 2 months.   Doxycycline should be taken with food to prevent nausea. Do not lay down for 30 minutes after taking. Be cautious with sun exposure and use good sun protection while on this medication. Pregnant women should not take this medication.   BRACHIORADIAL PRURITUS Left Forearm Failed permethrin TMC  Recommend OTC Sarna Calm & Cool Anti-Itch PRN  Patient will let us know if itching continues and Dr. Katrinka Blazing will consider adding gabapentin.  DERMATOFIBROMA    Return in about 2 months (around 09/16/2023) for Dermatitis.  Anise Salvo, RMA, am acting as scribe for Elie Goody, MD .   Documentation: I have reviewed the above documentation for accuracy and completeness, and I agree with the above.  Elie Goody, MD

## 2023-07-20 ENCOUNTER — Ambulatory Visit (INDEPENDENT_AMBULATORY_CARE_PROVIDER_SITE_OTHER): Payer: BC Managed Care – PPO | Admitting: Nurse Practitioner

## 2023-07-20 ENCOUNTER — Encounter: Payer: Self-pay | Admitting: Nurse Practitioner

## 2023-07-20 VITALS — BP 100/65 | HR 81 | Temp 97.5°F | Ht 65.0 in | Wt 186.2 lb

## 2023-07-20 DIAGNOSIS — E559 Vitamin D deficiency, unspecified: Secondary | ICD-10-CM

## 2023-07-20 DIAGNOSIS — G43829 Menstrual migraine, not intractable, without status migrainosus: Secondary | ICD-10-CM

## 2023-07-20 DIAGNOSIS — R7301 Impaired fasting glucose: Secondary | ICD-10-CM | POA: Diagnosis not present

## 2023-07-20 DIAGNOSIS — F325 Major depressive disorder, single episode, in full remission: Secondary | ICD-10-CM

## 2023-07-20 DIAGNOSIS — Z0000 Encounter for general adult medical examination without abnormal findings: Secondary | ICD-10-CM

## 2023-07-20 DIAGNOSIS — G2581 Restless legs syndrome: Secondary | ICD-10-CM

## 2023-07-20 DIAGNOSIS — E6609 Other obesity due to excess calories: Secondary | ICD-10-CM

## 2023-07-20 DIAGNOSIS — Z803 Family history of malignant neoplasm of breast: Secondary | ICD-10-CM

## 2023-07-20 DIAGNOSIS — E782 Mixed hyperlipidemia: Secondary | ICD-10-CM

## 2023-07-20 DIAGNOSIS — Z30432 Encounter for removal of intrauterine contraceptive device: Secondary | ICD-10-CM

## 2023-07-20 DIAGNOSIS — Z23 Encounter for immunization: Secondary | ICD-10-CM | POA: Diagnosis not present

## 2023-07-20 DIAGNOSIS — E66811 Obesity, class 1: Secondary | ICD-10-CM

## 2023-07-20 NOTE — Assessment & Plan Note (Signed)
Noted on past labs, continue supplement and adjust as needed.

## 2023-07-20 NOTE — Assessment & Plan Note (Signed)
Chronic.  Noted on past labs with ASCVD 0.6%.  Continue diet and exercise focus, lipid panel at today.

## 2023-07-20 NOTE — Assessment & Plan Note (Signed)
Mammogram ordered

## 2023-07-20 NOTE — Assessment & Plan Note (Signed)
BMI 30.99.  Recommended eating smaller high protein, low fat meals more frequently and exercising 30 mins a day 5 times a week with a goal of 10-15lb weight loss in the next 3 months. Patient voiced their understanding and motivation to adhere to these recommendations.

## 2023-07-20 NOTE — Assessment & Plan Note (Signed)
Chronic, ongoing.  Denies SI/HI.  Rarely taking medication at this time.  Will refill as needed.

## 2023-07-20 NOTE — Assessment & Plan Note (Signed)
Noted on past labs, check A1c today. ?

## 2023-07-20 NOTE — Assessment & Plan Note (Signed)
Chronic, improved.  Rarely uses medicine.

## 2023-07-20 NOTE — Progress Notes (Signed)
BP 100/65   Pulse 81   Temp (!) 97.5 F (36.4 C) (Oral)   Ht 5\' 5"  (1.651 m)   Wt 186 lb 3.2 oz (84.5 kg)   LMP  (LMP Unknown)   SpO2 96%   BMI 30.99 kg/m    Subjective:    Patient ID: Tammy Stout, female    DOB: 1975-09-23, 48 y.o.   MRN: [id number]  HPI: Tammy Stout is a 48 y.o. female presenting on 07/20/2023 for comprehensive medical examination. Current medical complaints include:none  She currently lives with: husband Menopausal Symptoms: no   Would like IUD removed, has been in for 10 years or more -- no further menstrual cycles.  Over summer had a very short one, has been a year since true cycle.  Feels like menopausal phase is present.  MIGRAINES Taking Metoprolol, Imitrex and Duloxetine. Rarely has migraines since starting low carbohydrate diet last summer. Took month off December off  and ate more carbs, had 2 migraines during this month.  Rarely takes Requip for RLS. Duration: chronic Headache status at time of visit: symptomatic -- Imitrex helped Treatments attempted: aleve, excedrin and triptans   Aura: no Nausea:  yes Vomiting: no Photophobia:  yes Phonophobia:  yes Effect on social functioning:  yes Numbers of missed days of school/work each month: none Confusion:  no Gait disturbance/ataxia:  no Behavioral changes:  no Fevers:  no  The 10-year ASCVD risk score (Arnett DK, et al., 2019) is: 0.6%   Values used to calculate the score:     Age: 45 years     Sex: Female     Is Non-Hispanic African American: No     Diabetic: No     Tobacco smoker: No     Systolic Blood Pressure: 100 mmHg     Is BP treated: No     HDL Cholesterol: 71 mg/dL     Total Cholesterol: 248 mg/dL  Depression Screen done today and results listed below:     07/20/2023    1:24 PM 05/11/2023    2:02 PM 04/19/2023    1:25 PM 12/31/2022    9:41 AM 06/08/2022   10:38 AM  Depression screen PHQ 2/9  Decreased Interest 0 0 0 1 0  Down, Depressed, Hopeless 0 0 0 1 0  PHQ  - 2 Score 0 0 0 2 0  Altered sleeping 0 0 0 3 0  Tired, decreased energy 0 0 0 1 0  Change in appetite 0 0 0 0 0  Feeling bad or failure about yourself  0 0 0 0 0  Trouble concentrating 0 0 0 1 0  Moving slowly or fidgety/restless 0 0 0 0 0  Suicidal thoughts 0 0 0 0 0  PHQ-9 Score 0 0 0 7 0  Difficult doing work/chores Not difficult at all Not difficult at all Not difficult at all  Not difficult at all      07/20/2023    1:24 PM 12/31/2022    9:42 AM 06/08/2022   10:38 AM 06/03/2021    4:19 PM  GAD 7 : Generalized Anxiety Score  Nervous, Anxious, on Edge 0 0 0 0  Control/stop worrying 0 0 0 0  Worry too much - different things 0 0 0 0  Trouble relaxing 0 2 0 0  Restless 0 0 0 0  Easily annoyed or irritable 0 0 1 0  Afraid - awful might happen 0 0 0 0  Total GAD 7 Score  0 2 1 0  Anxiety Difficulty Not difficult at all  Not difficult at all Not difficult at all      06/08/2022   10:38 AM 12/31/2022    9:42 AM 04/19/2023    1:25 PM 05/11/2023    2:01 PM 07/20/2023    1:24 PM  Fall Risk  Falls in the past year? 0 0 0 0 0  Was there an injury with Fall? 0 0 0 0 0  Fall Risk Category Calculator 0 0 0 0 0  Fall Risk Category (Retired) Low      Patient at Risk for Falls Due to No Fall Risks No Fall Risks No Fall Risks No Fall Risks No Fall Risks  Fall risk Follow up Falls evaluation completed Falls evaluation completed Falls prevention discussed;Education provided;Falls evaluation completed Falls prevention discussed;Education provided;Falls evaluation completed Falls evaluation completed    Functional Status Survey: Is the patient deaf or have difficulty hearing?: No Does the patient have difficulty seeing, even when wearing glasses/contacts?: No Does the patient have difficulty concentrating, remembering, or making decisions?: No Does the patient have difficulty walking or climbing stairs?: No Does the patient have difficulty dressing or bathing?: No Does the patient have  difficulty doing errands alone such as visiting a doctor's office or shopping?: No    Past Medical History:  Past Medical History:  Diagnosis Date   Depression    Dermatitis 2012   Eczema    Perioral dermatitis    Tendonitis    right hand    Surgical History:  Past Surgical History:  Procedure Laterality Date   CESAREAN SECTION  05/2006   SECTION DUE TO NO DIALATIONA AND CORD WAS AROUND BABY NECK   COLONOSCOPY WITH PROPOFOL N/A 07/24/2022   Procedure: COLONOSCOPY WITH PROPOFOL;  Surgeon: Midge Minium, MD;  Location: [facility];  Service: Endoscopy;  Laterality: N/A;   FOOT TENDON SURGERY  06/03/2019   Plantar fascitis    Medications:  Current Outpatient Medications on File Prior to Visit  Medication Sig   cetirizine (ZYRTEC) 10 MG tablet Take 10 mg by mouth daily.   doxycycline (MONODOX) 100 MG capsule Take 1 capsule (100 mg total) by mouth 2 (two) times daily. With food   DULoxetine (CYMBALTA) 60 MG capsule TAKE (1) CAPSULE BY MOUTH EVERY DAY   metoprolol tartrate (LOPRESSOR) 25 MG tablet TAKE (1) TABLET BY MOUTH TWICE DAILY   rOPINIRole (REQUIP) 0.25 MG tablet TAKE ONE TABLET BY MOUTH AT BEDTIME.   SUMAtriptan (IMITREX) 50 MG tablet TAKE 1 TABLET ONCE AS NEEDED FOR MIGRAINE FOR 1 DOSE...  MAY REPEAT A SECOND AFTER 2 HOURS IF NEEDED...   VITAMIN D PO Take by mouth daily.   No current facility-administered medications on file prior to visit.    Allergies:  No Known Allergies  Social History:  Social History   Socioeconomic History   Marital status: Married    Spouse name: Not on file   Number of children: Not on file   Years of education: Not on file   Highest education level: Not on file  Occupational History   Not on file  Tobacco Use   Smoking status: Never   Smokeless tobacco: Never  Vaping Use   Vaping status: Never Used  Substance and Sexual Activity   Alcohol use: Yes    Comment: social   Drug use: No   Sexual activity: Yes    Birth  control/protection: I.U.D.  Other Topics Concern   Not on file  Social History Narrative   Not on file   Social Drivers of Health   Financial Resource Strain: Low Risk  (06/03/2021)   Overall Financial Resource Strain (CARDIA)    Difficulty of Paying Living Expenses: Not hard at all  Food Insecurity: No Food Insecurity (06/03/2021)   Hunger Vital Sign    Worried About Running Out of Food in the Last Year: Never true    Ran Out of Food in the Last Year: Never true  Transportation Needs: No Transportation Needs (06/03/2021)   PRAPARE - Administrator, Civil Service (Medical): No    Lack of Transportation (Non-Medical): No  Physical Activity: Sufficiently Active (06/03/2021)   Exercise Vital Sign    Days of Exercise per Week: 4 days    Minutes of Exercise per Session: 40 min  Stress: No Stress Concern Present (06/03/2021)   [employer] of Occupational Health - Occupational Stress Questionnaire    Feeling of Stress : Only a little  Social Connections: Moderately Isolated (06/03/2021)   Social Connection and Isolation Panel [NHANES]    Frequency of Communication with Friends and Family: More than three times a week    Frequency of Social Gatherings with Friends and Family: More than three times a week    Attends Religious Services: Never    Database administrator or Organizations: No    Attends Banker Meetings: Never    Marital Status: Married  Catering manager Violence: Not At Risk (06/03/2021)   Humiliation, Afraid, Rape, and Kick questionnaire    Fear of Current or Ex-Partner: No    Emotionally Abused: No    Physically Abused: No    Sexually Abused: No   Social History   Tobacco Use  Smoking Status Never  Smokeless Tobacco Never   Social History   Substance and Sexual Activity  Alcohol Use Yes   Comment: social    Family History:  Family History  Problem Relation Age of Onset   Breast cancer Mother 49   Diabetes Mother    Cancer  Mother 47       One breast   Thyroid disease Father    Hyperlipidemia Father    Thyroid disease Maternal Aunt    Cancer Maternal Grandmother 99       lung cancer   Diabetes Maternal Grandmother    Diabetes Maternal Grandfather    Cancer Paternal Grandmother 15       kidney cancer    Past medical history, surgical history, medications, allergies, family history and social history reviewed with patient today and changes made to appropriate areas of the chart.   Review of Systems - negative All other ROS negative except what is listed above and in the HPI.      Objective:    BP 100/65   Pulse 81   Temp (!) 97.5 F (36.4 C) (Oral)   Ht 5\' 5"  (1.651 m)   Wt 186 lb 3.2 oz (84.5 kg)   LMP  (LMP Unknown)   SpO2 96%   BMI 30.99 kg/m   Wt Readings from Last 3 Encounters:  07/20/23 186 lb 3.2 oz (84.5 kg)  05/11/23 183 lb 11.2 oz (83.3 kg)  04/19/23 185 lb 4.8 oz (84.1 kg)    Physical Exam Vitals and nursing note reviewed.  Constitutional:      General: She is awake. She is not in acute distress.    Appearance: She is well-developed and well-groomed. She is obese. She is not ill-appearing.  HENT:     Head: Normocephalic and atraumatic.     Right Ear: Hearing, tympanic membrane, ear canal and external ear normal. No drainage.     Left Ear: Hearing, tympanic membrane, ear canal and external ear normal. No drainage.     Nose: Nose normal.     Right Sinus: No maxillary sinus tenderness or frontal sinus tenderness.     Left Sinus: No maxillary sinus tenderness or frontal sinus tenderness.     Mouth/Throat:     Mouth: Mucous membranes are moist.     Pharynx: Oropharynx is clear. Uvula midline. No pharyngeal swelling, oropharyngeal exudate or posterior oropharyngeal erythema.  Eyes:     General: Lids are normal.        Right eye: No discharge.        Left eye: No discharge.     Extraocular Movements: Extraocular movements intact.     Conjunctiva/sclera: Conjunctivae normal.      Pupils: Pupils are equal, round, and reactive to light.     Visual Fields: Right eye visual fields normal and left eye visual fields normal.  Neck:     Thyroid: No thyromegaly.     Vascular: No carotid bruit.     Trachea: Trachea normal.  Cardiovascular:     Rate and Rhythm: Normal rate and regular rhythm.     Heart sounds: Normal heart sounds. No murmur heard.    No gallop.  Pulmonary:     Effort: Pulmonary effort is normal. No accessory muscle usage or respiratory distress.     Breath sounds: Normal breath sounds.  Chest:  Breasts:    Right: Normal.     Left: Normal.  Abdominal:     General: Bowel sounds are normal.     Palpations: Abdomen is soft. There is no hepatomegaly or splenomegaly.     Tenderness: There is no abdominal tenderness.  Genitourinary:    Vagina: Normal.     Cervix: Normal.     Comments: Cervix slight posterior, IUD strings viewed.  IUD removed intact.  Scant spotting noted.  Tolerated well.  IUD showed to patient. Musculoskeletal:        General: Normal range of motion.     Cervical back: Normal range of motion and neck supple.     Right lower leg: No edema.     Left lower leg: No edema.  Lymphadenopathy:     Head:     Right side of head: No submental, submandibular, tonsillar, preauricular or posterior auricular adenopathy.     Left side of head: No submental, submandibular, tonsillar, preauricular or posterior auricular adenopathy.     Cervical: No cervical adenopathy.     Upper Body:     Right upper body: No supraclavicular, axillary or pectoral adenopathy.     Left upper body: No supraclavicular, axillary or pectoral adenopathy.  Skin:    General: Skin is warm and dry.     Capillary Refill: Capillary refill takes less than 2 seconds.     Findings: No rash.  Neurological:     Mental Status: She is alert and oriented to person, place, and time.     Gait: Gait is intact.     Deep Tendon Reflexes: Reflexes are normal and symmetric.     Reflex  Scores:      Brachioradialis reflexes are 2+ on the right side and 2+ on the left side.      Patellar reflexes are 2+ on the right side and 2+ on the left  side. Psychiatric:        Attention and Perception: Attention normal.        Mood and Affect: Mood normal.        Speech: Speech normal.        Behavior: Behavior normal. Behavior is cooperative.        Thought Content: Thought content normal.        Judgment: Judgment normal.    Results for orders placed or performed in visit on 01/22/23  Comp Met (CMET)   Collection Time: 01/22/23 10:17 AM  Result Value Ref Range   Glucose 79 70 - 99 mg/dL   BUN 21 6 - 24 mg/dL   Creatinine, Ser 8.11 0.57 - 1.00 mg/dL   eGFR 96 >91 YN/WGN/5.62   BUN/Creatinine Ratio 27 (H) 9 - 23   Sodium 140 134 - 144 mmol/L   Potassium 4.2 3.5 - 5.2 mmol/L   Chloride 101 96 - 106 mmol/L   CO2 26 20 - 29 mmol/L   Calcium 9.5 8.7 - 10.2 mg/dL   Total Protein 6.9 6.0 - 8.5 g/dL   Albumin 4.5 3.9 - 4.9 g/dL   Globulin, Total 2.4 1.5 - 4.5 g/dL   Bilirubin Total 0.3 0.0 - 1.2 mg/dL   Alkaline Phosphatase 70 44 - 121 IU/L   AST 18 0 - 40 IU/L   ALT 18 0 - 32 IU/L  CBC w/Diff   Collection Time: 01/22/23 10:17 AM  Result Value Ref Range   WBC 6.3 3.4 - 10.8 x10E3/uL   RBC 4.35 3.77 - 5.28 x10E6/uL   Hemoglobin 12.3 11.1 - 15.9 g/dL   Hematocrit 13.0 86.5 - 46.6 %   MCV 89 79 - 97 fL   MCH 28.3 26.6 - 33.0 pg   MCHC 31.7 31.5 - 35.7 g/dL   RDW 78.4 69.6 - 29.5 %   Platelets 272 150 - 450 x10E3/uL   Neutrophils 65 Not Estab. %   Lymphs 20 Not Estab. %   Monocytes 8 Not Estab. %   Eos 6 Not Estab. %   Basos 1 Not Estab. %   Neutrophils Absolute 4.1 1.4 - 7.0 x10E3/uL   Lymphocytes Absolute 1.2 0.7 - 3.1 x10E3/uL   Monocytes Absolute 0.5 0.1 - 0.9 x10E3/uL   EOS (ABSOLUTE) 0.4 0.0 - 0.4 x10E3/uL   Basophils Absolute 0.1 0.0 - 0.2 x10E3/uL   Immature Granulocytes 0 Not Estab. %   Immature Grans (Abs) 0.0 0.0 - 0.1 x10E3/uL  Magnesium   Collection  Time: 01/22/23 10:17 AM  Result Value Ref Range   Magnesium 2.0 1.6 - 2.3 mg/dL  Phosphorus   Collection Time: 01/22/23 10:17 AM  Result Value Ref Range   Phosphorus 3.8 3.0 - 4.3 mg/dL      Assessment & Plan:   Problem List Items Addressed This Visit       Cardiovascular and Mediastinum   Migraine   Chronic, stable.  Continue Metoprolol + Imitrex for acute episodes only.  Minimally using medication at this time, is diet focused.  Refills as needed.  Labs today.        Endocrine   IFG (impaired fasting glucose)   Noted on past labs, check A1c today.      Relevant Orders   HgB A1c     Other   Depression - Primary   Chronic, ongoing.  Denies SI/HI.  Rarely taking medication at this time.  Will refill as needed.      Relevant Orders  TSH   Family history of breast cancer in female   Mammogram ordered.      Hyperlipidemia   Chronic.  Noted on past labs with ASCVD 0.6%.  Continue diet and exercise focus, lipid panel at today.      Relevant Orders   Comprehensive metabolic panel   Lipid Panel w/o Chol/HDL Ratio   Obesity   BMI 16.10.  Recommended eating smaller high protein, low fat meals more frequently and exercising 30 mins a day 5 times a week with a goal of 10-15lb weight loss in the next 3 months. Patient voiced their understanding and motivation to adhere to these recommendations.       Restless leg   Chronic, improved.  Rarely uses medicine.        Relevant Orders   CBC with Differential/Platelet   TSH   Vitamin D deficiency   Noted on past labs, continue supplement and adjust as needed.      Relevant Orders   VITAMIN D 25 Hydroxy (Vit-D Deficiency, Fractures)   Other Visit Diagnoses       Encounter for IUD removal       IUD removed today per patient request.  Tolerated procedure well.  IUD intact and strings intact.  Scant bleeding noted.  Educated patient on procedure.     Flu vaccine need       Flu vaccine today, educated patient.    Relevant Orders   Flu vaccine trivalent PF, 6mos and older(Flulaval,Afluria,Fluarix,Fluzone) (Completed)     Encounter for annual physical exam       Annual physical today with labs and health maintenance reviewed, discussed with patient.        Follow up plan: Return in about 1 year (around 07/19/2024) for Annual Physical.   LABORATORY TESTING:  - Pap smear: Up To Date  IMMUNIZATIONS:   - Tdap: Tetanus vaccination status reviewed: last tetanus booster within 10 years. - Influenza: Administered today - Pneumovax: Not applicable - Prevnar: Not applicable - HPV: Not applicable - Zostavax vaccine: Not applicable - COVID: has had 2 of these  SCREENING: -Mammogram: Ordered today -- due for repeat - Colonoscopy: Up To Date -- next due 07/24/2032 - Bone Density: Not applicable  -Hearing Test: Not applicable  -Spirometry: Not applicable   PATIENT COUNSELING:   Advised to take 1 mg of folate supplement per day if capable of pregnancy.   Sexuality: Discussed sexually transmitted diseases, partner selection, use of condoms, avoidance of unintended pregnancy  and contraceptive alternatives.   Advised to avoid cigarette smoking.  I discussed with the patient that most people either abstain from alcohol or drink within safe limits (<=14/week and <=4 drinks/occasion for males, <=7/weeks and <= 3 drinks/occasion for females) and that the risk for alcohol disorders and other health effects rises proportionally with the number of drinks per week and how often a drinker exceeds daily limits.  Discussed cessation/primary prevention of drug use and availability of treatment for abuse.   Diet: Encouraged to adjust caloric intake to maintain  or achieve ideal body weight, to reduce intake of dietary saturated fat and total fat, to limit sodium intake by avoiding high sodium foods and not adding table salt, and to maintain adequate dietary potassium and calcium preferably from fresh fruits,  vegetables, and low-fat dairy products.    Stressed the importance of regular exercise  Injury prevention: Discussed safety belts, safety helmets, smoke detector, smoking near bedding or upholstery.   Dental health: Discussed importance of regular tooth  brushing, flossing, and dental visits.    NEXT PREVENTATIVE PHYSICAL DUE IN 1 YEAR. Return in about 1 year (around 07/19/2024) for Annual Physical.

## 2023-07-20 NOTE — Assessment & Plan Note (Signed)
Chronic, stable.  Continue Metoprolol + Imitrex for acute episodes only.  Minimally using medication at this time, is diet focused.  Refills as needed.  Labs today.

## 2023-07-21 ENCOUNTER — Encounter: Payer: Self-pay | Admitting: Nurse Practitioner

## 2023-07-21 LAB — COMPREHENSIVE METABOLIC PANEL
ALT: 18 [IU]/L (ref 0–32)
AST: 21 [IU]/L (ref 0–40)
Albumin: 4.4 g/dL (ref 3.9–4.9)
Alkaline Phosphatase: 63 [IU]/L (ref 44–121)
BUN/Creatinine Ratio: 24 — ABNORMAL HIGH (ref 9–23)
BUN: 23 mg/dL (ref 6–24)
Bilirubin Total: <0.2 mg/dL (ref 0.0–1.2)
CO2: 25 mmol/L (ref 20–29)
Calcium: 9.5 mg/dL (ref 8.7–10.2)
Chloride: 104 mmol/L (ref 96–106)
Creatinine, Ser: 0.97 mg/dL (ref 0.57–1.00)
Globulin, Total: 2.5 g/dL (ref 1.5–4.5)
Glucose: 94 mg/dL (ref 70–99)
Potassium: 4.1 mmol/L (ref 3.5–5.2)
Sodium: 142 mmol/L (ref 134–144)
Total Protein: 6.9 g/dL (ref 6.0–8.5)
eGFR: 73 mL/min/{1.73_m2} (ref >59)

## 2023-07-21 LAB — CBC WITH DIFFERENTIAL/PLATELET
Basophils Absolute: 0.1 10*3/uL (ref 0.0–0.2)
Basos: 1 %
EOS (ABSOLUTE): 0.6 10*3/uL — ABNORMAL HIGH (ref 0.0–0.4)
Eos: 9 %
Hematocrit: 37.3 % (ref 34.0–46.6)
Hemoglobin: 12.4 g/dL (ref 11.1–15.9)
Immature Grans (Abs): 0 10*3/uL (ref 0.0–0.1)
Immature Granulocytes: 0 %
Lymphocytes Absolute: 1.8 10*3/uL (ref 0.7–3.1)
Lymphs: 29 %
MCH: 30.4 pg (ref 26.6–33.0)
MCHC: 33.2 g/dL (ref 31.5–35.7)
MCV: 91 fL (ref 79–97)
Monocytes Absolute: 0.5 10*3/uL (ref 0.1–0.9)
Monocytes: 7 %
Neutrophils Absolute: 3.4 10*3/uL (ref 1.4–7.0)
Neutrophils: 54 %
Platelets: 272 10*3/uL (ref 150–450)
RBC: 4.08 x10E6/uL (ref 3.77–5.28)
RDW: 12.3 % (ref 11.7–15.4)
WBC: 6.3 10*3/uL (ref 3.4–10.8)

## 2023-07-21 LAB — LIPID PANEL W/O CHOL/HDL RATIO
Cholesterol, Total: 238 mg/dL — ABNORMAL HIGH (ref 100–199)
HDL: 79 mg/dL (ref >39)
LDL Chol Calc (NIH): 143 mg/dL — ABNORMAL HIGH (ref 0–99)
Triglycerides: 95 mg/dL (ref 0–149)
VLDL Cholesterol Cal: 16 mg/dL (ref 5–40)

## 2023-07-21 LAB — TSH: TSH: 2.15 u[IU]/mL (ref 0.450–4.500)

## 2023-07-21 LAB — HEMOGLOBIN A1C
Est. average glucose Bld gHb Est-mCnc: 120 mg/dL
Hgb A1c MFr Bld: 5.8 % — ABNORMAL HIGH (ref 4.8–5.6)

## 2023-07-21 LAB — VITAMIN D 25 HYDROXY (VIT D DEFICIENCY, FRACTURES): Vit D, 25-Hydroxy: 65.3 ng/mL (ref 30.0–100.0)

## 2023-07-21 NOTE — Progress Notes (Signed)
Contacted via MyChart   Good morning Tammy Stout, your labs have returned: - CBC shows no anemia or infection. - Kidney function, creatinine and eGFR, remains normal, as is liver function, AST and ALT.  - A1c is still in prediabetic range at 5.8%, continue diet and exercise focus. - Vitamin D and TSH are stable. - Lipid panel continues to show elevations, but no medications needed at this time.  Continue diet focus.  How are you doing today?  Any spotting?   Keep being stellar!!  Thank you for allowing me to participate in your care.  I appreciate you. Kindest regards, Maurianna Benard

## 2023-07-28 DIAGNOSIS — F3289 Other specified depressive episodes: Secondary | ICD-10-CM | POA: Diagnosis not present

## 2023-08-30 DIAGNOSIS — F3289 Other specified depressive episodes: Secondary | ICD-10-CM | POA: Diagnosis not present

## 2023-09-01 ENCOUNTER — Other Ambulatory Visit: Payer: Self-pay

## 2023-09-01 MED ORDER — METOPROLOL TARTRATE 25 MG PO TABS: ORAL_TABLET | ORAL | 4 refills | Status: DC

## 2023-09-09 ENCOUNTER — Encounter: Payer: Self-pay | Admitting: Nurse Practitioner

## 2023-09-09 ENCOUNTER — Other Ambulatory Visit: Payer: Self-pay | Admitting: Nurse Practitioner

## 2023-09-10 MED ORDER — DULOXETINE HCL 60 MG PO CPEP: ORAL_CAPSULE | ORAL | 4 refills | Status: DC

## 2023-09-10 NOTE — Telephone Encounter (Signed)
 Duplicate request, refilled 09/10/23 for 90 and 4 refills.  Requested Prescriptions  Pending Prescriptions Disp Refills   DULoxetine (CYMBALTA) 60 MG capsule [Pharmacy Med Name: DULOXETINE HCL DR CAPS 60MG ] 90 capsule 3    Sig: TAKE 1 CAPSULE DAILY     Psychiatry: Antidepressants - SNRI - duloxetine Passed - 09/10/2023  8:52 AM      Passed - Cr in normal range and within 360 days    Creatinine  Date Value Ref Range Status  06/25/2012 0.67 0.60 - 1.30 mg/dL Final   Creatinine, Ser  Date Value Ref Range Status  07/20/2023 0.97 0.57 - 1.00 mg/dL Final         Passed - eGFR is 30 or above and within 360 days    EGFR (African American)  Date Value Ref Range Status  06/25/2012 >60  Final   GFR calc Af Amer  Date Value Ref Range Status  05/03/2020 115 >59 mL/min/1.73 Final    Comment:    **In accordance with recommendations from the NKF-ASN Task force,**   [facility] is in the process of updating its eGFR calculation to the   2021 CKD-EPI creatinine equation that estimates kidney function   without a race variable.    EGFR (Non-African Amer.)  Date Value Ref Range Status  06/25/2012 >60  Final    Comment:    eGFR values <76mL/min/1.73 m2 may be an indication of chronic kidney disease (CKD). Calculated eGFR is useful in patients with stable renal function. The eGFR calculation will not be reliable in acutely ill patients when serum creatinine is changing rapidly. It is not useful in  patients on dialysis. The eGFR calculation may not be applicable to patients at the low and high extremes of body sizes, pregnant women, and vegetarians.    GFR calc non Af Amer  Date Value Ref Range Status  05/03/2020 100 >59 mL/min/1.73 Final   eGFR  Date Value Ref Range Status  07/20/2023 73 >59 mL/min/1.73 Final         Passed - Completed PHQ-2 or PHQ-9 in the last 360 days      Passed - Last BP in normal range    BP Readings from Last 1 Encounters:  07/20/23 100/65         Passed -  Valid encounter within last 6 months    Recent Outpatient Visits           1 month ago Major depressive disorder with single episode, in full remission (HCC)   [place] [facility] [place], Corrie Dandy T, NP   4 months ago Impetigo   [facility] M[place], Oswaldo Conroy, PA-C   4 months ago Impetigo   [place] [facility] Mecum, Oswaldo Conroy, PA-C   7 months ago Leg cramps   [facility] [place], Sherran Needs, NP   8 months ago Scabies   [facility] Mecum, Oswaldo Conroy, PA-C       Future Appointments             In 6 days Elie Goody, MD [facility]   In 10 months [place], Dorie Rank, NP [facility]

## 2023-09-16 ENCOUNTER — Ambulatory Visit: Payer: BC Managed Care – PPO | Admitting: Dermatology

## 2023-10-18 DIAGNOSIS — F3289 Other specified depressive episodes: Secondary | ICD-10-CM | POA: Diagnosis not present

## 2023-11-29 DIAGNOSIS — F3289 Other specified depressive episodes: Secondary | ICD-10-CM | POA: Diagnosis not present

## 2023-12-14 ENCOUNTER — Ambulatory Visit (INDEPENDENT_AMBULATORY_CARE_PROVIDER_SITE_OTHER): Admitting: Nurse Practitioner

## 2023-12-14 ENCOUNTER — Encounter: Payer: Self-pay | Admitting: Nurse Practitioner

## 2023-12-14 VITALS — BP 116/73 | HR 80 | Temp 98.4°F | Ht 65.0 in | Wt 199.8 lb

## 2023-12-14 DIAGNOSIS — E66811 Obesity, class 1: Secondary | ICD-10-CM | POA: Diagnosis not present

## 2023-12-14 DIAGNOSIS — E6609 Other obesity due to excess calories: Secondary | ICD-10-CM | POA: Diagnosis not present

## 2023-12-14 DIAGNOSIS — Z6833 Body mass index (BMI) 33.0-33.9, adult: Secondary | ICD-10-CM

## 2023-12-14 DIAGNOSIS — K219 Gastro-esophageal reflux disease without esophagitis: Secondary | ICD-10-CM | POA: Insufficient documentation

## 2023-12-14 DIAGNOSIS — L739 Follicular disorder, unspecified: Secondary | ICD-10-CM | POA: Insufficient documentation

## 2023-12-14 MED ORDER — SULFAMETHOXAZOLE-TRIMETHOPRIM 800-160 MG PO TABS
1.0000 | ORAL_TABLET | Freq: Two times a day (BID) | ORAL | 0 refills | Status: AC
Start: 2023-12-14 — End: 2023-12-21

## 2023-12-14 NOTE — Assessment & Plan Note (Signed)
 BMI 33.25.  Recommended eating smaller high protein, low fat meals more frequently and exercising 30 mins a day 5 times a week with a goal of 10-15lb weight loss in the next 3 months. Patient voiced their understanding and motivation to adhere to these recommendations. She wished to discuss Wegovy and Zepbound, which we discussed today.  Advised her to reach out to insurance and check on coverage, if covered we could consider starting one and then follow-up in 4-6 weeks.

## 2023-12-14 NOTE — Assessment & Plan Note (Signed)
 Acute to posterior upper thighs, areas currently healing but she reports they are recurrent.  Will send in Bactrim  BID for 7 days.  Recommend she continue topical abx to areas and cleanse with Hibiclens.  If any worsening to alert provider.

## 2023-12-14 NOTE — Patient Instructions (Addendum)
 Wegovy or Zepbound  GERD in Adults: Diet Changes When you have gastroesophageal reflux disease (GERD), you may need to make changes to your diet. Choosing the right foods can help with your symptoms. Think about working with an expert in healthy eating called a dietitian. They can help you make healthy food choices. What are tips for following this plan? Reading food labels Look for foods that are low in saturated fat. Foods that may help with your symptoms include: Foods with less than 5% of daily value (DV) of fat. Foods with 0 grams of trans fat. Cooking [employer] in ways that don't use a lot of fat. These ways include: Baking. Steaming. Grilling. Broiling. To add flavor, try to use herbs that are low in spice and acidity. Avoid frying your food. Meal planning  Eat small meals often rather than eating 3 large meals each day. Eat your meals slowly in a place where you feel relaxed. If told by your health care provider, avoid: Foods that cause symptoms. Keep a food diary to keep track of foods that cause symptoms. Alcohol. Drinking a lot of liquid with meals. General instructions For 2-3 hours after you eat, avoid: Bending over. Exercise. Lying down. Chew sugar-free gum after meals. What foods should I eat? Eat a healthy diet. Try to include: Foods with high amounts of fiber. These include: Fruits and vegetables. Whole grains and beans. Low-fat dairy products. Lean meats, fish, and poultry. Egg whites. Foods that cause symptoms in someone else may not cause symptoms for you. Work with your provider to find foods that are safe for you. The items listed above may not be all the foods and drinks you can have. Talk with a dietitian to learn more. The items listed above may not be a complete list of foods and beverages you can eat and drink. Contact a dietitian for more information. What foods should I avoid? Limiting some of these foods may help with your symptoms. Each  person is different. Talk with a dietitian or your provider to help you find the exact foods to avoid. Some of the foods to avoid may include: Fruits Fruits with a lot of acid in them. These may include citrus fruits, such as oranges, grapefruit, pineapple, and lemons. Vegetables Deep-fried vegetables, such as [place] fries. Vegetables, sauces, or toppings made with added fat and vegetables with acid in them. These may include tomatoes and tomato products, chili peppers, onions, garlic, and horseradish. Grains Pastries or quick breads with added fat. Meats and other proteins High-fat meats, such as fatty beef or pork, hot dogs, ribs, ham, sausage, salami, and bacon. Fried meat or protein, such as fried fish and fried chicken. Egg yolks. Fats and oils Butter. Margarine. Shortening. Ghee. Drinks Coffee and other drinks with caffeine in them. Fizzy and sugary drinks, such as soda and energy drinks. Fruit juice made with acidic fruits, such as orange or grapefruit. Tomato juice. Sweets and desserts Chocolate and cocoa. Donuts. Seasonings and condiments Mint, such as peppermint and spearmint. Condiments, herbs, or seasonings that cause symptoms. These may include curry, hot sauce, or vinegar-based salad dressings. The items listed above may not be all the foods and drinks you should avoid. Talk with a dietitian to learn more. Questions to ask your health care provider Changes to your diet and everyday life are often the first steps taken to manage symptoms of GERD. If these changes don't help, talk with your provider about taking medicines. Where to find more information International  [employer] for Gastrointestinal Disorders: aboutgerd.org This information is not intended to replace advice given to you by your health care provider. Make sure you discuss any questions you have with your health care provider. Document Revised: 04/20/2023 Document Reviewed: 11/04/2022 Elsevier Patient Education   2024 [employer].

## 2023-12-14 NOTE — Progress Notes (Signed)
 BP 116/73   Pulse 80   Temp 98.4 F (36.9 C) (Oral)   Ht 5' 5 (1.651 m)   Wt 199 lb 12.8 oz (90.6 kg)   SpO2 97%   BMI 33.25 kg/m    Subjective:    Patient ID: Tammy Stout, female    DOB: 1976/01/19, 48 y.o.   MRN: [id number]  HPI: Tammy Stout is a 48 y.o. female  Chief Complaint  Patient presents with   Gastroesophageal Reflux    Patient states she has been having issues with heartburn for the last week. States she has been having to take pepcid everyday which has helped. States she has vomited 2 nights ago and it was nothing but acid.    We discussed weight loss medications today, injectables -- Zepbound and Wegovy, as she was interested. No family history of thyroid cancer (MTC, MEN 2, thyroid cell tumors) or pancreatitis.     GERD Often does not have issues with heartburn, but over past week has had frequently.  2 nights ago she vomited with this.  Has been taking Pepcid daily which is helping. Has well water  at home.   GERD control status: uncontrolled Satisfied with current treatment? yes Heartburn frequency: daily Medication side effects: no  Medication compliance: fluctuating Previous GERD medications: Pepcid and TUMS Antacid use frequency:  tried but did not help much Duration: week Nature: burning and tightening in epigastric area + nausea Location: as above Heartburn duration: all day if does not take Pepcid Alleviatiating factors:  Pepcid Aggravating factors:  Dysphagia: no Odynophagia:  no Hematemesis: no Blood in stool: no EGD: no   SKIN INFECTION Has areas to lower bottom and top of legs, that are coming and going.  Has tried Neosporin and moisturizers without benefit.  Have been there for a least 1-2 months. Duration: months Location: as above History of trauma in area: no Pain: yes Quality: yes Severity: 2/10 Redness: yes Swelling: no -- cannot see well Oozing: no Pus: no Fevers: no Nausea/vomiting: as above Status:  fluctuating Treatments attempted:antibiotics  Tetanus: UTD   Relevant past medical, surgical, family and social history reviewed and updated as indicated. Interim medical history since our last visit reviewed. Allergies and medications reviewed and updated.  Review of Systems  Constitutional:  Negative for activity change, appetite change, diaphoresis, fatigue and fever.  Respiratory:  Negative for cough, chest tightness, shortness of breath and wheezing.   Cardiovascular:  Negative for chest pain, palpitations and leg swelling.  Gastrointestinal:  Positive for nausea and vomiting. Negative for abdominal distention, abdominal pain, blood in stool, constipation and diarrhea.  Neurological: Negative.   Psychiatric/Behavioral: Negative.      Per HPI unless specifically indicated above     Objective:    BP 116/73   Pulse 80   Temp 98.4 F (36.9 C) (Oral)   Ht 5' 5 (1.651 m)   Wt 199 lb 12.8 oz (90.6 kg)   SpO2 97%   BMI 33.25 kg/m   Wt Readings from Last 3 Encounters:  12/14/23 199 lb 12.8 oz (90.6 kg)  07/20/23 186 lb 3.2 oz (84.5 kg)  05/11/23 183 lb 11.2 oz (83.3 kg)    Physical Exam Vitals and nursing note reviewed.  Constitutional:      General: She is awake. She is not in acute distress.    Appearance: She is well-developed and well-groomed. She is obese. She is not ill-appearing or toxic-appearing.  HENT:     Head: Normocephalic.  Right Ear: Hearing and external ear normal.     Left Ear: Hearing and external ear normal.   Eyes:     General: Lids are normal.        Right eye: No discharge.        Left eye: No discharge.     Conjunctiva/sclera: Conjunctivae normal.     Pupils: Pupils are equal, round, and reactive to light.   Neck:     Thyroid: No thyromegaly.     Vascular: No carotid bruit.   Cardiovascular:     Rate and Rhythm: Normal rate and regular rhythm.     Heart sounds: Normal heart sounds. No murmur heard.    No gallop.  Pulmonary:      Effort: Pulmonary effort is normal. No accessory muscle usage or respiratory distress.     Breath sounds: Normal breath sounds.  Abdominal:     General: Bowel sounds are normal. There is no distension.     Palpations: Abdomen is soft.     Tenderness: There is no abdominal tenderness.   Musculoskeletal:     Cervical back: Normal range of motion and neck supple.     Right lower leg: No edema.     Left lower leg: No edema.  Lymphadenopathy:     Cervical: No cervical adenopathy.   Skin:    General: Skin is warm and dry.     Comments: Small, round areas that are crusted over. Three noted to upper posterior thigh area bilaterally.   Neurological:     Mental Status: She is alert and oriented to person, place, and time.     Deep Tendon Reflexes: Reflexes are normal and symmetric.     Reflex Scores:      Brachioradialis reflexes are 2+ on the right side and 2+ on the left side.      Patellar reflexes are 2+ on the right side and 2+ on the left side.  Psychiatric:        Attention and Perception: Attention normal.        Mood and Affect: Mood normal.        Speech: Speech normal.        Behavior: Behavior normal. Behavior is cooperative.        Thought Content: Thought content normal.     Results for orders placed or performed in visit on 07/20/23  CBC with Differential/Platelet   Collection Time: 07/20/23  1:56 PM  Result Value Ref Range   WBC 6.3 3.4 - 10.8 x10E3/uL   RBC 4.08 3.77 - 5.28 x10E6/uL   Hemoglobin 12.4 11.1 - 15.9 g/dL   Hematocrit 62.6 65.9 - 46.6 %   MCV 91 79 - 97 fL   MCH 30.4 26.6 - 33.0 pg   MCHC 33.2 31.5 - 35.7 g/dL   RDW 87.6 88.2 - 84.5 %   Platelets 272 150 - 450 x10E3/uL   Neutrophils 54 Not Estab. %   Lymphs 29 Not Estab. %   Monocytes 7 Not Estab. %   Eos 9 Not Estab. %   Basos 1 Not Estab. %   Neutrophils Absolute 3.4 1.4 - 7.0 x10E3/uL   Lymphocytes Absolute 1.8 0.7 - 3.1 x10E3/uL   Monocytes Absolute 0.5 0.1 - 0.9 x10E3/uL   EOS (ABSOLUTE)  0.6 (H) 0.0 - 0.4 x10E3/uL   Basophils Absolute 0.1 0.0 - 0.2 x10E3/uL   Immature Granulocytes 0 Not Estab. %   Immature Grans (Abs) 0.0 0.0 - 0.1 x10E3/uL  Comprehensive metabolic  panel   Collection Time: 07/20/23  1:56 PM  Result Value Ref Range   Glucose 94 70 - 99 mg/dL   BUN 23 6 - 24 mg/dL   Creatinine, Ser 9.02 0.57 - 1.00 mg/dL   eGFR 73 >40 fO/fpw/8.26   BUN/Creatinine Ratio 24 (H) 9 - 23   Sodium 142 134 - 144 mmol/L   Potassium 4.1 3.5 - 5.2 mmol/L   Chloride 104 96 - 106 mmol/L   CO2 25 20 - 29 mmol/L   Calcium 9.5 8.7 - 10.2 mg/dL   Total Protein 6.9 6.0 - 8.5 g/dL   Albumin 4.4 3.9 - 4.9 g/dL   Globulin, Total 2.5 1.5 - 4.5 g/dL   Bilirubin Total <9.7 0.0 - 1.2 mg/dL   Alkaline Phosphatase 63 44 - 121 IU/L   AST 21 0 - 40 IU/L   ALT 18 0 - 32 IU/L  Lipid Panel w/o Chol/HDL Ratio   Collection Time: 07/20/23  1:56 PM  Result Value Ref Range   Cholesterol, Total 238 (H) 100 - 199 mg/dL   Triglycerides 95 0 - 149 mg/dL   HDL 79 >60 mg/dL   VLDL Cholesterol Cal 16 5 - 40 mg/dL   LDL Chol Calc (NIH) 856 (H) 0 - 99 mg/dL  TSH   Collection Time: 07/20/23  1:56 PM  Result Value Ref Range   TSH 2.150 0.450 - 4.500 uIU/mL  VITAMIN D  25 Hydroxy (Vit-D Deficiency, Fractures)   Collection Time: 07/20/23  1:56 PM  Result Value Ref Range   Vit D, 25-Hydroxy 65.3 30.0 - 100.0 ng/mL  HgB A1c   Collection Time: 07/20/23  1:56 PM  Result Value Ref Range   Hgb A1c MFr Bld 5.8 (H) 4.8 - 5.6 %   Est. average glucose Bld gHb Est-mCnc 120 mg/dL      Assessment & Plan:   Problem List Items Addressed This Visit       Digestive   GERD (gastroesophageal reflux disease) - Primary   Over past couple weeks has worsened.  Is finding benefit from Pepcid, recommend to continue this daily for now.  Discussed that diet changes may need to take place and provided her information on this.  No red flag symptoms.  Will obtain H. Pylori testing due to new onset and well water .       Relevant Orders   H. pylori antigen, stool     Musculoskeletal and Integument   Folliculitis   Acute to posterior upper thighs, areas currently healing but she reports they are recurrent.  Will send in Bactrim  BID for 7 days.  Recommend she continue topical abx to areas and cleanse with Hibiclens.  If any worsening to alert provider.        Other   Obesity   BMI 33.25.  Recommended eating smaller high protein, low fat meals more frequently and exercising 30 mins a day 5 times a week with a goal of 10-15lb weight loss in the next 3 months. Patient voiced their understanding and motivation to adhere to these recommendations. She wished to discuss Wegovy and Zepbound, which we discussed today.  Advised her to reach out to insurance and check on coverage, if covered we could consider starting one and then follow-up in 4-6 weeks.         Follow up plan: Return in about 8 weeks (around 02/08/2024) for GERD.

## 2023-12-14 NOTE — Assessment & Plan Note (Signed)
 Over past couple weeks has worsened.  Is finding benefit from Pepcid, recommend to continue this daily for now.  Discussed that diet changes may need to take place and provided her information on this.  No red flag symptoms.  Will obtain H. Pylori testing due to new onset and well water .

## 2024-01-07 ENCOUNTER — Ambulatory Visit: Payer: Self-pay

## 2024-01-07 ENCOUNTER — Encounter: Payer: Self-pay | Admitting: Nurse Practitioner

## 2024-01-07 NOTE — Telephone Encounter (Signed)
 FYI Only or Action Required?: Action required by provider: request for appointment.  Patient was last seen in primary care on 12/14/2023 by Cannady, Jolene T, NP.  Called Nurse Triage reporting Skin sores.  Symptoms began several weeks ago.  Interventions attempted: Prescription medications: antibiotic.  Symptoms are: unchanged.Finished antibiotic for rash, area are back. Itchy and painful.  Triage Disposition: See Physician Within 24 Hours  Patient/caregiver understands and will follow disposition?: Yes    Copied from CRM [id number]. Topic: Clinical - Red Word Triage >> Jan 07, 2024 11:06 AM Treva T wrote: [facility] that prompted transfer to Nurse Triage: Patient calling states she has sores on buttocks area, that are not healing, reports actually they are getting worse. Patient states there is discharge and pain associated with sores. Reason for Disposition  [1] Multiple small blisters grouped together in one area of body (i.e., dermatomal distribution or band or stripe) AND [2] painful  Answer Assessment - Initial Assessment Questions 1. APPEARANCE of SORES: What do the sores look like?     Dime sizes 2. NUMBER: How many sores are there?     many 3. SIZE: How big is the largest sore?     Dime size 4. LOCATION: Where are the sores located?     buttocks 5. ONSET: When did the sores begin?     2 months 6. TENDER: Does it hurt when you touch it?  (Scale 1-10; or mild, moderate, severe)      yes 7. CAUSE: What do you think is causing the sores?     unsure 8. OTHER SYMPTOMS: Do you have any other symptoms? (e.g., fever, new weakness)     no  Protocols used: Sores-A-AH

## 2024-01-10 ENCOUNTER — Encounter: Payer: Self-pay | Admitting: Family Medicine

## 2024-01-10 ENCOUNTER — Ambulatory Visit (INDEPENDENT_AMBULATORY_CARE_PROVIDER_SITE_OTHER): Admitting: Family Medicine

## 2024-01-10 VITALS — BP 118/81 | HR 85 | Temp 98.4°F | Ht 65.0 in | Wt 205.2 lb

## 2024-01-10 DIAGNOSIS — L989 Disorder of the skin and subcutaneous tissue, unspecified: Secondary | ICD-10-CM | POA: Diagnosis not present

## 2024-01-10 MED ORDER — TRIAMCINOLONE ACETONIDE 0.5 % EX OINT: 1.0000 | TOPICAL_OINTMENT | Freq: Two times a day (BID) | CUTANEOUS | 0 refills | Status: AC

## 2024-01-10 NOTE — Progress Notes (Signed)
 BP 118/81   Pulse 85   Temp 98.4 F (36.9 C) (Oral)   Ht 5' 5 (1.651 m)   Wt 205 lb 3.2 oz (93.1 kg)   SpO2 96%   BMI 34.15 kg/m    Subjective:    Patient ID: Tammy Stout, female    DOB: 1975-11-10, 48 y.o.   MRN: [id number]  HPI: Tammy Stout is a 48 y.o. female  Chief Complaint  Patient presents with   Skin Lesions     Buttocks   SKIN LESION Duration: 2+ months Location:  buttocks and upper thighs Painful: yes Itching: yes Onset: sudden Context: bigger- will almost heal up and then break open again Associated signs and symptoms: occasional discharge   Relevant past medical, surgical, family and social history reviewed and updated as indicated. Interim medical history since our last visit reviewed. Allergies and medications reviewed and updated.  Review of Systems  Constitutional: Negative.   Respiratory: Negative.    Cardiovascular: Negative.   Musculoskeletal: Negative.   Skin:  Positive for rash and wound. Negative for color change and pallor.  Psychiatric/Behavioral: Negative.      Per HPI unless specifically indicated above     Objective:    BP 118/81   Pulse 85   Temp 98.4 F (36.9 C) (Oral)   Ht 5' 5 (1.651 m)   Wt 205 lb 3.2 oz (93.1 kg)   SpO2 96%   BMI 34.15 kg/m   Wt Readings from Last 3 Encounters:  01/10/24 205 lb 3.2 oz (93.1 kg)  12/14/23 199 lb 12.8 oz (90.6 kg)  07/20/23 186 lb 3.2 oz (84.5 kg)    Physical Exam Vitals and nursing note reviewed.  Constitutional:      General: She is not in acute distress.    Appearance: Normal appearance. She is well-developed.  HENT:     Head: Normocephalic and atraumatic.     Right Ear: Hearing and external ear normal.     Left Ear: Hearing and external ear normal.     Nose: Nose normal.     Mouth/Throat:     Mouth: Mucous membranes are moist.     Pharynx: Oropharynx is clear.  Eyes:     General: Lids are normal. No scleral icterus.       Right eye: No discharge.         Left eye: No discharge.     Conjunctiva/sclera: Conjunctivae normal.  Pulmonary:     Effort: Pulmonary effort is normal. No respiratory distress.  Musculoskeletal:        General: Normal range of motion.  Skin:    Coloration: Skin is not jaundiced or pale.     Findings: No bruising, erythema, lesion or rash.     Comments: Excoriated rash on R buttock and L inner thigh  Neurological:     Mental Status: She is alert. Mental status is at baseline. She is disoriented.  Psychiatric:        Mood and Affect: Mood normal.        Speech: Speech normal.        Behavior: Behavior normal.        Thought Content: Thought content normal.        Judgment: Judgment normal.     Results for orders placed or performed in visit on 07/20/23  CBC with Differential/Platelet   Collection Time: 07/20/23  1:56 PM  Result Value Ref Range   WBC 6.3 3.4 - 10.8 x10E3/uL  RBC 4.08 3.77 - 5.28 x10E6/uL   Hemoglobin 12.4 11.1 - 15.9 g/dL   Hematocrit 62.6 65.9 - 46.6 %   MCV 91 79 - 97 fL   MCH 30.4 26.6 - 33.0 pg   MCHC 33.2 31.5 - 35.7 g/dL   RDW 87.6 88.2 - 84.5 %   Platelets 272 150 - 450 x10E3/uL   Neutrophils 54 Not Estab. %   Lymphs 29 Not Estab. %   Monocytes 7 Not Estab. %   Eos 9 Not Estab. %   Basos 1 Not Estab. %   Neutrophils Absolute 3.4 1.4 - 7.0 x10E3/uL   Lymphocytes Absolute 1.8 0.7 - 3.1 x10E3/uL   Monocytes Absolute 0.5 0.1 - 0.9 x10E3/uL   EOS (ABSOLUTE) 0.6 (H) 0.0 - 0.4 x10E3/uL   Basophils Absolute 0.1 0.0 - 0.2 x10E3/uL   Immature Granulocytes 0 Not Estab. %   Immature Grans (Abs) 0.0 0.0 - 0.1 x10E3/uL  Comprehensive metabolic panel   Collection Time: 07/20/23  1:56 PM  Result Value Ref Range   Glucose 94 70 - 99 mg/dL   BUN 23 6 - 24 mg/dL   Creatinine, Ser 9.02 0.57 - 1.00 mg/dL   eGFR 73 >40 fO/fpw/8.26   BUN/Creatinine Ratio 24 (H) 9 - 23   Sodium 142 134 - 144 mmol/L   Potassium 4.1 3.5 - 5.2 mmol/L   Chloride 104 96 - 106 mmol/L   CO2 25 20 - 29 mmol/L    Calcium 9.5 8.7 - 10.2 mg/dL   Total Protein 6.9 6.0 - 8.5 g/dL   Albumin 4.4 3.9 - 4.9 g/dL   Globulin, Total 2.5 1.5 - 4.5 g/dL   Bilirubin Total <9.7 0.0 - 1.2 mg/dL   Alkaline Phosphatase 63 44 - 121 IU/L   AST 21 0 - 40 IU/L   ALT 18 0 - 32 IU/L  Lipid Panel w/o Chol/HDL Ratio   Collection Time: 07/20/23  1:56 PM  Result Value Ref Range   Cholesterol, Total 238 (H) 100 - 199 mg/dL   Triglycerides 95 0 - 149 mg/dL   HDL 79 >60 mg/dL   VLDL Cholesterol Cal 16 5 - 40 mg/dL   LDL Chol Calc (NIH) 856 (H) 0 - 99 mg/dL  TSH   Collection Time: 07/20/23  1:56 PM  Result Value Ref Range   TSH 2.150 0.450 - 4.500 uIU/mL  VITAMIN D  25 Hydroxy (Vit-D Deficiency, Fractures)   Collection Time: 07/20/23  1:56 PM  Result Value Ref Range   Vit D, 25-Hydroxy 65.3 30.0 - 100.0 ng/mL  HgB A1c   Collection Time: 07/20/23  1:56 PM  Result Value Ref Range   Hgb A1c MFr Bld 5.8 (H) 4.8 - 5.6 %   Est. average glucose Bld gHb Est-mCnc 120 mg/dL      Assessment & Plan:   Problem List Items Addressed This Visit   None Visit Diagnoses       Skin lesions    -  Primary   Concern for irritation. No sign of infection. Will treat with triamcinalone ointment. If not improving may want to consider biopsy given chronicity.        Follow up plan: Return if symptoms worsen or fail to improve.

## 2024-01-29 NOTE — Patient Instructions (Incomplete)
 GERD in Adults: Diet Changes When you have gastroesophageal reflux disease (GERD), you may need to make changes to your diet. Choosing the right foods can help with your symptoms. Think about working with an expert in healthy eating called a dietitian. They can help you make healthy food choices. What are tips for following this plan? Reading food labels Look for foods that are low in saturated fat. Foods that may help with your symptoms include: Foods with less than 5% of daily value (DV) of fat. Foods with 0 grams of trans fat. Cooking [employer] in ways that don't use a lot of fat. These ways include: Baking. Steaming. Grilling. Broiling. To add flavor, try to use herbs that are low in spice and acidity. Avoid frying your food. Meal planning  Eat small meals often rather than eating 3 large meals each day. Eat your meals slowly in a place where you feel relaxed. If told by your health care provider, avoid: Foods that cause symptoms. Keep a food diary to keep track of foods that cause symptoms. Alcohol. Drinking a lot of liquid with meals. General instructions For 2-3 hours after you eat, avoid: Bending over. Exercise. Lying down. Chew sugar-free gum after meals. What foods should I eat? Eat a healthy diet. Try to include: Foods with high amounts of fiber. These include: Fruits and vegetables. Whole grains and beans. Low-fat dairy products. Lean meats, fish, and poultry. Egg whites. Foods that cause symptoms in someone else may not cause symptoms for you. Work with your provider to find foods that are safe for you. The items listed above may not be all the foods and drinks you can have. Talk with a dietitian to learn more. The items listed above may not be a complete list of foods and beverages you can eat and drink. Contact a dietitian for more information. What foods should I avoid? Limiting some of these foods may help with your symptoms. Each person is different.  Talk with a dietitian or your provider to help you find the exact foods to avoid. Some of the foods to avoid may include: Fruits Fruits with a lot of acid in them. These may include citrus fruits, such as oranges, grapefruit, pineapple, and lemons. Vegetables Deep-fried vegetables, such as [place] fries. Vegetables, sauces, or toppings made with added fat and vegetables with acid in them. These may include tomatoes and tomato products, chili peppers, onions, garlic, and horseradish. Grains Pastries or quick breads with added fat. Meats and other proteins High-fat meats, such as fatty beef or pork, hot dogs, ribs, ham, sausage, salami, and bacon. Fried meat or protein, such as fried fish and fried chicken. Egg yolks. Fats and oils Butter. Margarine. Shortening. Ghee. Drinks Coffee and other drinks with caffeine in them. Fizzy and sugary drinks, such as soda and energy drinks. Fruit juice made with acidic fruits, such as orange or grapefruit. Tomato juice. Sweets and desserts Chocolate and cocoa. Donuts. Seasonings and condiments Mint, such as peppermint and spearmint. Condiments, herbs, or seasonings that cause symptoms. These may include curry, hot sauce, or vinegar-based salad dressings. The items listed above may not be all the foods and drinks you should avoid. Talk with a dietitian to learn more. Questions to ask your health care provider Changes to your diet and everyday life are often the first steps taken to manage symptoms of GERD. If these changes don't help, talk with your provider about taking medicines. Where to find more information [employer] G[employer]intestinal Disorders:  aboutgerd.org This information is not intended to replace advice given to you by your health care provider. Make sure you discuss any questions you have with your health care provider. Document Revised: 04/20/2023 Document Reviewed: 11/04/2022 Elsevier Patient Education  2024 [employer].

## 2024-01-31 ENCOUNTER — Ambulatory Visit: Admitting: Nurse Practitioner

## 2024-02-18 ENCOUNTER — Ambulatory Visit: Payer: Self-pay

## 2024-02-18 NOTE — Telephone Encounter (Signed)
  FYI Only or Action Required?: FYI only for provider.  Patient was last seen in primary care on 01/10/2024 by Vicci Duwaine SQUIBB, DO.  Called Nurse Triage reporting Leg Pain.  Symptoms began several months ago.  Interventions attempted: OTC medications: nsaids/tylenlol.  Symptoms are: unchanged.  Triage Disposition: See PCP When Office is Open (Within 3 Days)  Patient/caregiver understands and will follow disposition?: Yes  Copied from CRM #[id number]. Topic: Clinical - Red Word Triage >> Feb 18, 2024  2:11 PM Debby BROCKS wrote: Red Word that prompted transfer to Nurse Triage: Leg pain that has been worsening over the past few days Reason for Disposition  [1] MODERATE pain (e.g., interferes with normal activities, limping) AND [2] present > 3 days  Answer Assessment - Initial Assessment Questions 1. ONSET: When did the pain start?      Several months ago 2. LOCATION: Where is the pain located?      Right leg, middle of thigh. Pain is worsened after prolonged sitting, then standing 3. PAIN: How bad is the pain?    (Scale 1-10; or mild, moderate, severe)     At worse, 7/10 when attempting to stand 4. WORK OR EXERCISE: Has there been any recent work or exercise that involved this part of the body?      Not that patient is aware 5. CAUSE: What do you think is causing the leg pain?     unknown 6. OTHER SYMPTOMS: Do you have any other symptoms? (e.g., chest pain, back pain, breathing difficulty, swelling, rash, fever, numbness, weakness)     Denies back pain, numbness, or tingling 7. PREGNANCY: Is there any chance you are pregnant? When was your last menstrual period?     N/A  Protocols used: Leg Pain-A-AH

## 2024-02-23 ENCOUNTER — Other Ambulatory Visit: Payer: Self-pay | Admitting: Family Medicine

## 2024-02-23 ENCOUNTER — Ambulatory Visit (INDEPENDENT_AMBULATORY_CARE_PROVIDER_SITE_OTHER): Admitting: Family Medicine

## 2024-02-23 ENCOUNTER — Ambulatory Visit
Admission: RE | Admit: 2024-02-23 | Discharge: 2024-02-23 | Disposition: A | Discharge status: Home / Self Care | Attending: Family Medicine | Admitting: Family Medicine

## 2024-02-23 ENCOUNTER — Encounter: Payer: Self-pay | Admitting: Family Medicine

## 2024-02-23 ENCOUNTER — Ambulatory Visit
Admission: RE | Admit: 2024-02-23 | Discharge: 2024-02-23 | Disposition: A | Discharge status: Home / Self Care | Source: Ambulatory Visit | Attending: Family Medicine | Admitting: Family Medicine

## 2024-02-23 VITALS — BP 118/74 | HR 74 | Ht 65.0 in | Wt 211.0 lb

## 2024-02-23 DIAGNOSIS — R1031 Right lower quadrant pain: Secondary | ICD-10-CM | POA: Diagnosis not present

## 2024-02-23 DIAGNOSIS — M1611 Unilateral primary osteoarthritis, right hip: Secondary | ICD-10-CM | POA: Diagnosis not present

## 2024-02-23 NOTE — Progress Notes (Unsigned)
 Right hip ap  Acute Office Visit  Subjective:     Patient ID: Tammy Stout, female    DOB: Jan 26, 1976, 48 y.o.   MRN: [id number]  Chief Complaint  Patient presents with   Leg Pain    Sharp pain right side growing area, going on for 1 year,  getting worse over the last month,     HPI Patient is in today for  Discussed the use of AI scribe software for clinical note transcription with the patient, who gave verbal consent to proceed.  History of Present Illness Tammy Stout is a 48 year old female who presents with sharp right upper leg pain.  She has experienced sharp right upper leg pain for about a year, with worsening symptoms over the past month. The pain is most severe when standing up after sitting, especially after prolonged periods on her feet. It is primarily located in the groin area.  No imaging studies have been conducted. Over-the-counter medications like Aleve provide partial relief, but stretching exercises have not been effective.  There is no history of osteoporosis, knee problems, back issues, or flat feet. She has high arches and requires comfortable shoes to prevent foot bone issues. Clarks shoes are currently comfortable for her.  She has gained weight over the past year, but the pain was present even during weight loss. There is no swelling, redness, or warmth in the affected area. The pain can be severe enough to prevent walking, though she feels fine when not active.    Review of Systems  All other systems reviewed and are negative.       Objective:    BP 118/74   Pulse 74   Ht 5' 5 (1.651 m)   Wt 211 lb (95.7 kg)   SpO2 96%   BMI 35.11 kg/m    Physical Exam Vitals and nursing note reviewed.  Constitutional:      Appearance: Normal appearance.  HENT:     Head: Normocephalic.     Right Ear: External ear normal.     Left Ear: External ear normal.  Eyes:     Conjunctiva/sclera: Conjunctivae normal.  Cardiovascular:     Rate and  Rhythm: Normal rate.  Pulmonary:     Effort: Pulmonary effort is normal. No respiratory distress.  Abdominal:     Palpations: Abdomen is soft.  Musculoskeletal:        General: Tenderness present. Normal range of motion.  Skin:    General: Skin is warm.  Neurological:     Mental Status: She is alert and oriented to person, place, and time.  Psychiatric:        Mood and Affect: Mood normal.     No results found for any visits on 02/23/24.      Assessment & Plan:   Problem List Items Addressed This Visit   None Visit Diagnoses       Right groin pain    -  Primary     Assessment and Plan Assessment & Plan Right hip/groin pain Chronic right hip/groin pain for one year, worsening recently. Pain sharp, localized, exacerbated by standing. Differential includes joint irritation or arthritis. Imaging needed for further evaluation. Weight gain may contribute. - Order X-ray of the right hip. - Continue Aleve as needed, monitor for gastrointestinal or blood pressure issues. - Consider sports medicine referral if pain persists.  Weight gain Weight gain over the past year, possibly contributing to hip/groin pain.    No orders of the  defined types were placed in this encounter.   No follow-ups on file.  Vinary K Skyelar Halliday, MD

## 2024-02-25 ENCOUNTER — Telehealth: Payer: Self-pay

## 2024-02-25 NOTE — Telephone Encounter (Signed)
 Copied from CRM #[id number]. Topic: Clinical - Request for Lab/Test Order >> Feb 25, 2024  2:32 PM Turkey B wrote: Reason for CRM: Patient called in for results of xray of hip/ please cb

## 2024-02-28 DIAGNOSIS — F3289 Other specified depressive episodes: Secondary | ICD-10-CM | POA: Diagnosis not present

## 2024-02-29 ENCOUNTER — Ambulatory Visit: Payer: Self-pay | Admitting: Family Medicine

## 2024-02-29 ENCOUNTER — Telehealth: Payer: Self-pay

## 2024-02-29 NOTE — Telephone Encounter (Signed)
 Spoke with patient and informed of Dr MARLA results. She said there was a line in XR about possible trauma. Read results and told her to please hold so I can discuss with Dr MARLA. She told me  I really can't hold, so I will just discuss this with my regular physician. Then patient hung up.

## 2024-02-29 NOTE — Telephone Encounter (Signed)
 Copied from CRM [id number]. Topic: Clinical - Lab/Test Results >> Feb 28, 2024  5:43 PM Tammy Stout wrote: Reason for CRM: Patient states she received xrays the same day she visited Vinay K Kotturi, MD at [facility] and would like to know the results/findings of the xrays

## 2024-03-02 NOTE — Telephone Encounter (Signed)
 As we did not order provider here would generally not comment. The results have been commented on by the ordering provider and she should be advised to review those comments.

## 2024-03-20 NOTE — Patient Instructions (Signed)
 Hip Pain The hip is the joint between the upper legs and the lower pelvis. The bones, cartilage, tendons, and muscles of your hip joint support your body and allow you to move around. Hip pain can range from a minor ache to severe pain in one or both of your hips. The pain may be felt on the inside of the hip joint near the groin, or on the outside near the buttocks and upper thigh. You may also have swelling or stiffness in your hip area. Follow these instructions at home: Managing pain, stiffness, and swelling     If told, put ice on the painful area. Put ice in a plastic bag. Place a towel between your skin and the bag. Leave the ice on for 20 minutes, 2-3 times a day. If told, apply heat to the affected area as often as told by your health care provider. Use the heat source that your provider recommends, such as a moist heat pack or a heating pad. Place a towel between your skin and the heat source. Leave the heat on for 20-30 minutes. If your skin turns bright red, remove the ice or heat right away to prevent skin damage. The risk of damage is higher if you cannot feel pain, heat, or cold. Activity Do exercises as told by your provider. Avoid activities that cause pain. General instructions  Take over-the-counter and prescription medicines only as told by your provider. Keep a journal of your symptoms. Write down: How often you have hip pain. The location of your pain. What the pain feels like. What makes the pain worse. Sleep with a pillow between your legs on your most comfortable side. Keep all follow-up visits. Your provider will monitor your pain and activity. Contact a health care provider if: You cannot put weight on your leg. Your pain or swelling gets worse after a week. It gets harder to walk. You have a fever. Get help right away if: You fall. You have a sudden increase in pain and swelling in your hip. Your hip is red or swollen or very tender to touch. This  information is not intended to replace advice given to you by your health care provider. Make sure you discuss any questions you have with your health care provider. Document Revised: 02/10/2022 Document Reviewed: 02/10/2022 Elsevier Patient Education  2024 [employer].

## 2024-03-22 ENCOUNTER — Encounter: Payer: Self-pay | Admitting: Nurse Practitioner

## 2024-03-22 ENCOUNTER — Ambulatory Visit: Admitting: Nurse Practitioner

## 2024-03-22 VITALS — BP 119/78 | HR 79 | Temp 98.1°F | Resp 15 | Ht 65.0 in | Wt 212.6 lb

## 2024-03-22 DIAGNOSIS — M25551 Pain in right hip: Secondary | ICD-10-CM | POA: Diagnosis not present

## 2024-03-22 DIAGNOSIS — Z6835 Body mass index (BMI) 35.0-35.9, adult: Secondary | ICD-10-CM

## 2024-03-22 DIAGNOSIS — L739 Follicular disorder, unspecified: Secondary | ICD-10-CM

## 2024-03-22 DIAGNOSIS — E66812 Obesity, class 2: Secondary | ICD-10-CM | POA: Diagnosis not present

## 2024-03-22 DIAGNOSIS — G8929 Other chronic pain: Secondary | ICD-10-CM

## 2024-03-22 DIAGNOSIS — F5104 Psychophysiologic insomnia: Secondary | ICD-10-CM | POA: Diagnosis not present

## 2024-03-22 DIAGNOSIS — Z23 Encounter for immunization: Secondary | ICD-10-CM | POA: Diagnosis not present

## 2024-03-22 DIAGNOSIS — E6609 Other obesity due to excess calories: Secondary | ICD-10-CM

## 2024-03-22 DIAGNOSIS — G4700 Insomnia, unspecified: Secondary | ICD-10-CM | POA: Insufficient documentation

## 2024-03-22 MED ORDER — MELOXICAM 15 MG PO TABS: 15.0000 mg | ORAL_TABLET | Freq: Every day | ORAL | 0 refills | Status: AC | PRN

## 2024-03-22 MED ORDER — SULFAMETHOXAZOLE-TRIMETHOPRIM 800-160 MG PO TABS: 1.0000 | ORAL_TABLET | Freq: Two times a day (BID) | ORAL | 0 refills | Status: AC

## 2024-03-22 MED ORDER — TRAZODONE HCL 50 MG PO TABS: 25.0000 mg | ORAL_TABLET | Freq: Every evening | ORAL | 3 refills | Status: DC | PRN

## 2024-03-22 MED ORDER — HYDROCODONE-ACETAMINOPHEN 5-325 MG PO TABS: 1.0000 | ORAL_TABLET | Freq: Four times a day (QID) | ORAL | 0 refills | Status: AC | PRN

## 2024-03-22 NOTE — Assessment & Plan Note (Signed)
 Chronic, over past weeks since returning to work.  Affecting her mood.  Start Trazodone 25-50 MG at bedtime PRN for sleep.  Educated on this medication and plan.  She tried husband's and this worked well for her. Recommend: ?Maintain a regular sleep schedule, particularly a regular wake-up time in the morning ?Try not to force sleep ?Avoid caffeinated beverages after lunch ?Avoid alcohol near bedtime (eg, late afternoon and evening)  ?Avoid smoking or other nicotine intake, particularly during the evening ?Adjust the bedroom environment as needed to decrease stimuli (eg, reduce ambient light, turn off the television or radio) ?Avoid prolonged use of light-emitting screens (laptops, tablets, smartphones, ebooks) before bedtime  ?Resolve concerns or worries before bedtime ?Exercise regularly for at least 20 minutes, preferably more than four to five hours prior to bedtime  ?Avoid daytime naps, especially if they are longer than 20 to 30 minutes or occur late in the day

## 2024-03-22 NOTE — Assessment & Plan Note (Signed)
 BMI 35.38.  Recommended eating smaller high protein, low fat meals more frequently and exercising 30 mins a day 5 times a week with a goal of 10-15lb weight loss in the next 3 months. Patient voiced their understanding and motivation to adhere to these recommendations. She wished to discuss Wegovy and Zepbound, which we discussed today.  Advised her to reach out to insurance and check on coverage, if covered we could consider starting one and then follow-up in 4-6 weeks.

## 2024-03-22 NOTE — Progress Notes (Signed)
 BP 119/78 (BP Location: Left Arm, Patient Position: Sitting, Cuff Size: Large)   Pulse 79   Temp 98.1 F (36.7 C) (Oral)   Resp 15   Ht 5' 5 (1.651 m)   Wt 212 lb 9.6 oz (96.4 kg)   LMP  (LMP Unknown)   SpO2 95%   BMI 35.38 kg/m    Subjective:    Patient ID: Tammy Stout, female    DOB: 1975/11/15, 48 y.o.   MRN: [id number]  HPI: Tammy Stout is a 48 y.o. female  Chief Complaint  Patient presents with   Leg Pain    Started about a year. Near hip joint. Xrays show different.    Sleeping Problem    Has troubles falling asleep. Few weeks. Tried twice trazadone that was her husband and that worked well.    HIP/PELVIC PAIN Present for about one year, but since returning to teach.  On feet is fine, but at the end of the school day it hurts a lot to right pelvic and hip area.  Skin issues have returned, treated in past with Triamcinolone .   Duration: chronic Involved hip: right  Mechanism of injury: history of trauma when 20 Location: anterior Onset: gradual  Severity: 8/10 at worst/cannot walk, currently 0/10 Quality: sharp, aching, and throbbing Frequency: intermittent Radiation: no Aggravating factors: weight bearing, walking, bending, and movement  -- at end of long day, after sitting when stands back up Alleviating factors: Tylenol  and Aleeve only help a little  Status: fluctuating Treatments attempted: rest, APAP, and aleve   Relief with NSAIDs?: mild Weakness with weight bearing: no Weakness with walking: no Paresthesias / decreased sensation: no Swelling: no Redness:no Fevers: no   INSOMNIA Poor sleep for a few weeks, since starting back to teaching.  Took husbands Trazodone and that worked well. Duration: weeks Satisfied with sleep quality: no Difficulty falling asleep: yes Difficulty staying asleep: no Waking a few hours after sleep onset: once during night to use bathroom Early morning awakenings: no Daytime hypersomnolence: tired during  daytime Wakes feeling refreshed: no Good sleep hygiene: yes Apnea: no Snoring: no Depressed/anxious mood: yes -- related to poor sleep Recent stress: yes -- work Restless legs/nocturnal leg cramps: no Chronic pain/arthritis: yes History of sleep study: no Treatments attempted: melatonin and Trazodone      03/22/2024    8:31 AM 02/23/2024   11:47 AM 12/14/2023   10:09 AM 07/20/2023    1:24 PM 05/11/2023    2:02 PM  Depression screen PHQ 2/9  Decreased Interest 1 0 0 0 0  Down, Depressed, Hopeless 1 0 0 0 0  PHQ - 2 Score 2 0 0 0 0  Altered sleeping 3 0 0 0 0  Tired, decreased energy 3 2 1  0 0  Change in appetite 3 1 2  0 0  Feeling bad or failure about yourself  0 0 0 0 0  Trouble concentrating 3 2 0 0 0  Moving slowly or fidgety/restless 0 0 0 0 0  Suicidal thoughts 0 0 0 0 0  PHQ-9 Score 14 5 3  0 0  Difficult doing work/chores  Somewhat difficult Not difficult at all Not difficult at all Not difficult at all       03/22/2024    8:31 AM 02/23/2024   11:48 AM 12/14/2023   10:09 AM 07/20/2023    1:24 PM  GAD 7 : Generalized Anxiety Score  Nervous, Anxious, on Edge 1 0 0 0  Control/stop worrying 1  0 0 0  Worry too much - different things 0 0 0 0  Trouble relaxing 0 0 0 0  Restless 0 0 0 0  Easily annoyed or irritable 1 0 0 0  Afraid - awful might happen 0 0 0 0  Total GAD 7 Score 3 0 0 0  Anxiety Difficulty  Not difficult at all Not difficult at all Not difficult at all      Relevant past medical, surgical, family and social history reviewed and updated as indicated. Interim medical history since our last visit reviewed. Allergies and medications reviewed and updated.  Review of Systems  Constitutional:  Positive for fatigue. Negative for activity change, appetite change, diaphoresis and fever.  Respiratory:  Negative for cough, chest tightness and shortness of breath.   Cardiovascular:  Negative for chest pain, palpitations and leg swelling.  Gastrointestinal: Negative.    Musculoskeletal:  Positive for arthralgias.  Skin:  Positive for rash.  Neurological: Negative.   Psychiatric/Behavioral:  Positive for sleep disturbance. Negative for decreased concentration, self-injury and suicidal ideas.     Per HPI unless specifically indicated above     Objective:    BP 119/78 (BP Location: Left Arm, Patient Position: Sitting, Cuff Size: Large)   Pulse 79   Temp 98.1 F (36.7 C) (Oral)   Resp 15   Ht 5' 5 (1.651 m)   Wt 212 lb 9.6 oz (96.4 kg)   LMP  (LMP Unknown)   SpO2 95%   BMI 35.38 kg/m   Wt Readings from Last 3 Encounters:  03/22/24 212 lb 9.6 oz (96.4 kg)  02/23/24 211 lb (95.7 kg)  01/10/24 205 lb 3.2 oz (93.1 kg)    Physical Exam Vitals and nursing note reviewed.  Constitutional:      General: She is awake. She is not in acute distress.    Appearance: She is well-developed and well-groomed. She is obese. She is not ill-appearing or toxic-appearing.  HENT:     Head: Normocephalic.     Right Ear: Hearing and external ear normal.     Left Ear: Hearing and external ear normal.  Eyes:     General: Lids are normal.        Right eye: No discharge.        Left eye: No discharge.     Conjunctiva/sclera: Conjunctivae normal.     Pupils: Pupils are equal, round, and reactive to light.  Neck:     Thyroid: No thyromegaly.     Vascular: No carotid bruit.  Cardiovascular:     Rate and Rhythm: Normal rate and regular rhythm.     Heart sounds: Normal heart sounds. No murmur heard.    No gallop.  Pulmonary:     Effort: Pulmonary effort is normal. No accessory muscle usage or respiratory distress.     Breath sounds: Normal breath sounds.  Abdominal:     General: Bowel sounds are normal. There is no distension.     Palpations: Abdomen is soft.     Tenderness: There is no abdominal tenderness.  Musculoskeletal:     Cervical back: Normal range of motion and neck supple.     Right hip: No tenderness or crepitus. Normal range of motion. Normal  strength.     Left hip: Normal.     Right lower leg: No edema.     Left lower leg: No edema.  Lymphadenopathy:     Cervical: No cervical adenopathy.  Skin:    General: Skin is warm  and dry.     Comments: A few small areas of folliculitis to upper left thigh and right gluteal area.  Neurological:     Mental Status: She is alert and oriented to person, place, and time.     Deep Tendon Reflexes: Reflexes are normal and symmetric.     Reflex Scores:      Brachioradialis reflexes are 2+ on the right side and 2+ on the left side.      Patellar reflexes are 2+ on the right side and 2+ on the left side. Psychiatric:        Attention and Perception: Attention normal.        Mood and Affect: Mood normal.        Speech: Speech normal.        Behavior: Behavior normal. Behavior is cooperative.        Thought Content: Thought content normal.     Results for orders placed or performed in visit on 07/20/23  CBC with Differential/Platelet   Collection Time: 07/20/23  1:56 PM  Result Value Ref Range   WBC 6.3 3.4 - 10.8 x10E3/uL   RBC 4.08 3.77 - 5.28 x10E6/uL   Hemoglobin 12.4 11.1 - 15.9 g/dL   Hematocrit 62.6 65.9 - 46.6 %   MCV 91 79 - 97 fL   MCH 30.4 26.6 - 33.0 pg   MCHC 33.2 31.5 - 35.7 g/dL   RDW 87.6 88.2 - 84.5 %   Platelets 272 150 - 450 x10E3/uL   Neutrophils 54 Not Estab. %   Lymphs 29 Not Estab. %   Monocytes 7 Not Estab. %   Eos 9 Not Estab. %   Basos 1 Not Estab. %   Neutrophils Absolute 3.4 1.4 - 7.0 x10E3/uL   Lymphocytes Absolute 1.8 0.7 - 3.1 x10E3/uL   Monocytes Absolute 0.5 0.1 - 0.9 x10E3/uL   EOS (ABSOLUTE) 0.6 (H) 0.0 - 0.4 x10E3/uL   Basophils Absolute 0.1 0.0 - 0.2 x10E3/uL   Immature Granulocytes 0 Not Estab. %   Immature Grans (Abs) 0.0 0.0 - 0.1 x10E3/uL  Comprehensive metabolic panel   Collection Time: 07/20/23  1:56 PM  Result Value Ref Range   Glucose 94 70 - 99 mg/dL   BUN 23 6 - 24 mg/dL   Creatinine, Ser 9.02 0.57 - 1.00 mg/dL   eGFR 73 >40  fO/fpw/8.26   BUN/Creatinine Ratio 24 (H) 9 - 23   Sodium 142 134 - 144 mmol/L   Potassium 4.1 3.5 - 5.2 mmol/L   Chloride 104 96 - 106 mmol/L   CO2 25 20 - 29 mmol/L   Calcium 9.5 8.7 - 10.2 mg/dL   Total Protein 6.9 6.0 - 8.5 g/dL   Albumin 4.4 3.9 - 4.9 g/dL   Globulin, Total 2.5 1.5 - 4.5 g/dL   Bilirubin Total <9.7 0.0 - 1.2 mg/dL   Alkaline Phosphatase 63 44 - 121 IU/L   AST 21 0 - 40 IU/L   ALT 18 0 - 32 IU/L  Lipid Panel w/o Chol/HDL Ratio   Collection Time: 07/20/23  1:56 PM  Result Value Ref Range   Cholesterol, Total 238 (H) 100 - 199 mg/dL   Triglycerides 95 0 - 149 mg/dL   HDL 79 >60 mg/dL   VLDL Cholesterol Cal 16 5 - 40 mg/dL   LDL Chol Calc (NIH) 856 (H) 0 - 99 mg/dL  TSH   Collection Time: 07/20/23  1:56 PM  Result Value Ref Range   TSH 2.150 0.450 -  4.500 uIU/mL  VITAMIN D  25 Hydroxy (Vit-D Deficiency, Fractures)   Collection Time: 07/20/23  1:56 PM  Result Value Ref Range   Vit D, 25-Hydroxy 65.3 30.0 - 100.0 ng/mL  HgB A1c   Collection Time: 07/20/23  1:56 PM  Result Value Ref Range   Hgb A1c MFr Bld 5.8 (H) 4.8 - 5.6 %   Est. average glucose Bld gHb Est-mCnc 120 mg/dL      Assessment & Plan:   Problem List Items Addressed This Visit       Musculoskeletal and Integument   Folliculitis   Recurrent in nature.  Will send in Bactrim  BID for 7 days.  Recommend she continue topical triamcinolone  to areas and cleanse with Hibiclens.  If any worsening to alert provider.        Other   Obesity   BMI 35.38.  Recommended eating smaller high protein, low fat meals more frequently and exercising 30 mins a day 5 times a week with a goal of 10-15lb weight loss in the next 3 months. Patient voiced their understanding and motivation to adhere to these recommendations. She wished to discuss Wegovy and Zepbound, which we discussed today.  Advised her to reach out to insurance and check on coverage, if covered we could consider starting one and then follow-up in 4-6  weeks.       Insomnia   Chronic, over past weeks since returning to work.  Affecting her mood.  Start Trazodone 25-50 MG at bedtime PRN for sleep.  Educated on this medication and plan.  She tried husband's and this worked well for her. Recommend: ?Maintain a regular sleep schedule, particularly a regular wake-up time in the morning ?Try not to force sleep ?Avoid caffeinated beverages after lunch ?Avoid alcohol near bedtime (eg, late afternoon and evening)  ?Avoid smoking or other nicotine intake, particularly during the evening ?Adjust the bedroom environment as needed to decrease stimuli (eg, reduce ambient light, turn off the television or radio) ?Avoid prolonged use of light-emitting screens (laptops, tablets, smartphones, ebooks) before bedtime  ?Resolve concerns or worries before bedtime ?Exercise regularly for at least 20 minutes, preferably more than four to five hours prior to bedtime  ?Avoid daytime naps, especially if they are longer than 20 to 30 minutes or occur late in the day       Chronic right hip pain - Primary   Ongoing for over one year, fluctuates.  Will send in Meloxicam  and Norco to use at present as needed for pain at the end of the day.  Recent imaging performed.  Will get her into ortho for further recommendations.      Relevant Medications   traZODone (DESYREL) 50 MG tablet   HYDROcodone-acetaminophen  (NORCO/VICODIN) 5-325 MG tablet   meloxicam  (MOBIC ) 15 MG tablet   Other Relevant Orders   Ambulatory referral to Orthopedic Surgery   Other Visit Diagnoses       Flu vaccine need       Flu vaccine provided.   Relevant Orders   Flu vaccine trivalent PF, 6mos and older(Flulaval,Afluria,Fluarix,Fluzone) (Completed)        Follow up plan: Return if symptoms worsen or fail to improve.

## 2024-03-22 NOTE — Assessment & Plan Note (Signed)
 Recurrent in nature.  Will send in Bactrim  BID for 7 days.  Recommend she continue topical triamcinolone  to areas and cleanse with Hibiclens.  If any worsening to alert provider.

## 2024-03-22 NOTE — Assessment & Plan Note (Signed)
 Ongoing for over one year, fluctuates.  Will send in Meloxicam  and Norco to use at present as needed for pain at the end of the day.  Recent imaging performed.  Will get her into ortho for further recommendations.

## 2024-03-27 DIAGNOSIS — F3289 Other specified depressive episodes: Secondary | ICD-10-CM | POA: Diagnosis not present

## 2024-04-03 DIAGNOSIS — F411 Generalized anxiety disorder: Secondary | ICD-10-CM | POA: Diagnosis not present

## 2024-04-03 DIAGNOSIS — F330 Major depressive disorder, recurrent, mild: Secondary | ICD-10-CM | POA: Diagnosis not present

## 2024-04-13 ENCOUNTER — Ambulatory Visit (INDEPENDENT_AMBULATORY_CARE_PROVIDER_SITE_OTHER)

## 2024-04-13 DIAGNOSIS — M7631 Iliotibial band syndrome, right leg: Secondary | ICD-10-CM

## 2024-04-13 DIAGNOSIS — M1611 Unilateral primary osteoarthritis, right hip: Secondary | ICD-10-CM

## 2024-04-13 MED ORDER — MELOXICAM 15 MG PO TABS: 15.0000 mg | ORAL_TABLET | Freq: Every day | ORAL | 0 refills | Status: DC
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
## 2024-04-13 NOTE — Patient Instructions (Signed)

## 2024-04-24 ENCOUNTER — Encounter: Payer: Self-pay | Admitting: Radiology

## 2024-04-24 DIAGNOSIS — F3289 Other specified depressive episodes: Secondary | ICD-10-CM | POA: Diagnosis not present

## 2024-05-01 DIAGNOSIS — M7631 Iliotibial band syndrome, right leg: Secondary | ICD-10-CM | POA: Diagnosis not present

## 2024-05-01 DIAGNOSIS — M25551 Pain in right hip: Secondary | ICD-10-CM | POA: Diagnosis not present

## 2024-05-01 DIAGNOSIS — M7632 Iliotibial band syndrome, left leg: Secondary | ICD-10-CM | POA: Diagnosis not present

## 2024-05-15 DIAGNOSIS — M25551 Pain in right hip: Secondary | ICD-10-CM | POA: Diagnosis not present

## 2024-05-15 DIAGNOSIS — M7632 Iliotibial band syndrome, left leg: Secondary | ICD-10-CM | POA: Diagnosis not present

## 2024-05-15 DIAGNOSIS — M7631 Iliotibial band syndrome, right leg: Secondary | ICD-10-CM | POA: Diagnosis not present

## 2024-05-17 DIAGNOSIS — M7631 Iliotibial band syndrome, right leg: Secondary | ICD-10-CM | POA: Diagnosis not present

## 2024-05-17 DIAGNOSIS — M25551 Pain in right hip: Secondary | ICD-10-CM | POA: Diagnosis not present

## 2024-05-17 DIAGNOSIS — M7632 Iliotibial band syndrome, left leg: Secondary | ICD-10-CM | POA: Diagnosis not present

## 2024-05-22 DIAGNOSIS — F3289 Other specified depressive episodes: Secondary | ICD-10-CM | POA: Diagnosis not present

## 2024-05-23 DIAGNOSIS — M25551 Pain in right hip: Secondary | ICD-10-CM | POA: Diagnosis not present

## 2024-05-23 DIAGNOSIS — M7632 Iliotibial band syndrome, left leg: Secondary | ICD-10-CM | POA: Diagnosis not present

## 2024-05-23 DIAGNOSIS — M7631 Iliotibial band syndrome, right leg: Secondary | ICD-10-CM | POA: Diagnosis not present

## 2024-05-25 DIAGNOSIS — M25551 Pain in right hip: Secondary | ICD-10-CM | POA: Diagnosis not present

## 2024-05-25 DIAGNOSIS — M7631 Iliotibial band syndrome, right leg: Secondary | ICD-10-CM | POA: Diagnosis not present

## 2024-05-25 DIAGNOSIS — M7632 Iliotibial band syndrome, left leg: Secondary | ICD-10-CM | POA: Diagnosis not present

## 2024-06-01 DIAGNOSIS — M25551 Pain in right hip: Secondary | ICD-10-CM | POA: Diagnosis not present

## 2024-06-01 DIAGNOSIS — M7632 Iliotibial band syndrome, left leg: Secondary | ICD-10-CM | POA: Diagnosis not present

## 2024-06-01 DIAGNOSIS — M7631 Iliotibial band syndrome, right leg: Secondary | ICD-10-CM | POA: Diagnosis not present

## 2024-06-06 DIAGNOSIS — M7632 Iliotibial band syndrome, left leg: Secondary | ICD-10-CM | POA: Diagnosis not present

## 2024-06-06 DIAGNOSIS — M7631 Iliotibial band syndrome, right leg: Secondary | ICD-10-CM | POA: Diagnosis not present

## 2024-06-06 DIAGNOSIS — M25551 Pain in right hip: Secondary | ICD-10-CM | POA: Diagnosis not present

## 2024-06-08 DIAGNOSIS — M7632 Iliotibial band syndrome, left leg: Secondary | ICD-10-CM | POA: Diagnosis not present

## 2024-06-08 DIAGNOSIS — M25551 Pain in right hip: Secondary | ICD-10-CM | POA: Diagnosis not present

## 2024-06-08 DIAGNOSIS — M7631 Iliotibial band syndrome, right leg: Secondary | ICD-10-CM | POA: Diagnosis not present

## 2024-06-20 DIAGNOSIS — F330 Major depressive disorder, recurrent, mild: Secondary | ICD-10-CM | POA: Diagnosis not present

## 2024-06-20 DIAGNOSIS — F411 Generalized anxiety disorder: Secondary | ICD-10-CM | POA: Diagnosis not present

## 2024-07-12 ENCOUNTER — Ambulatory Visit: Payer: Self-pay

## 2024-07-12 ENCOUNTER — Encounter: Payer: Self-pay | Admitting: Student

## 2024-07-12 ENCOUNTER — Ambulatory Visit: Admitting: Student

## 2024-07-12 VITALS — BP 120/70 | HR 76 | Temp 97.8°F | Ht 65.0 in | Wt 214.5 lb

## 2024-07-12 DIAGNOSIS — R0789 Other chest pain: Secondary | ICD-10-CM

## 2024-07-12 NOTE — Telephone Encounter (Signed)
 Noted.

## 2024-07-12 NOTE — Telephone Encounter (Signed)
 FYI Only or Action Required?: FYI only for provider: appointment scheduled on today.  Patient was last seen in primary care on 03/22/2024 by Valerio Melanie DASEN, NP.  Called Nurse Triage reporting Rib Injury.  Symptoms began 2 days ago.  Interventions attempted: Prescription medications: ibuprofen  .  Symptoms are: unchanged.  Triage Disposition: See Physician Within 24 Hours (overriding Home Care)  Patient/caregiver understands and will follow disposition?: Yes  Reason for Disposition  Chest or rib pain from bending, lifting, or twisting injury  Answer Assessment - Initial Assessment Questions Additional info: Patient reports 2 nights ago she was laughing and then started coughing really strong, she developed left sided rib pain right away. Denies breathing difficulty, inspiration does not make pain worse, denies chest pain and all other symptoms. She reports 0/10 left rib pain when stationary, up to 8/10 pain with movement. Requesting evaluation to rule out/in fracture. Scheduled acute at alternate regional clinic.    1. MECHANISM: How did the injury happen?     Laughing and then coughed really hard and developed left side rib pain 2. ONSET: When did the injury happen? (e.g., minutes, hours, days ago)     2 days  3. LOCATION: Where on the chest is the injury located? Where does it hurt?     Left side rib 4. CHEST OR RIB PAIN SEVERITY: Is there pain? If Yes, ask: How bad is the pain? (e.g., Scale 0-10; none, mild, moderate, severe)     No movement 0/10, when she moves 8/10 5. BREATHING DIFFICULTY: Are you having any difficulty breathing? If Yes, ask: How bad is it?  (e.g., none, mild, moderate, severe)      denies 6. OTHER SYMPTOMS (e.g., cough, fever, rash)      Denies  Protocols used: Chest Injury - Bending, Lifting, or Twisting-A-AH Message from [place] sent at 07/12/2024  7:52 AM EST  Summary: possible left rib fracture   Reason for Triage: possible left  rib fracture Laughing then coughed 2 days pain radiating around to the back

## 2024-07-12 NOTE — Progress Notes (Signed)
 "  Established Patient Office Visit  Subjective   Patient ID: Tammy Stout, female    DOB: 1975/09/02  Age: 49 y.o. MRN: [id number]  Chief Complaint  Patient presents with   Rib Injury    Left side rib pain, sharp pain radiating across left arm and shoulder, no injury to area, patient reports taking ibuprofen  for the pain as needed, started Monday night     Tammy Stout is a 49 y.o. person with medical hx listed below who presents today for left sided sharp pain right after laughing and coughing with family members. Radiating around the breast,  but no breast pain. Pain is worse with positional change and left arm movement such as closing the car door. She denies CP with exhertion, dyspnea, wheezing. No trauma or injury. Has taken ibuprofen  with mild improvement.  Patient Active Problem List   Diagnosis Date Noted   Insomnia 03/22/2024   Chronic right hip pain 03/22/2024   GERD (gastroesophageal reflux disease) 12/14/2023   Folliculitis 12/14/2023   Vitamin D  deficiency 06/12/2023   History of iron deficiency anemia 06/03/2021   IFG (impaired fasting glucose) 10/26/2020   Obesity 05/03/2020   Restless leg 10/17/2018   Hyperlipidemia 08/03/2018   Migraine 05/29/2016   Depression    Eczema    Family history of breast cancer in female 04/08/2011      ROS Refer to HPI    Objective:     Outpatient Encounter Medications as of 07/12/2024  Medication Sig   cetirizine (ZYRTEC) 10 MG tablet Take 10 mg by mouth daily.   DULoxetine  (CYMBALTA ) 60 MG capsule TAKE (1) CAPSULE BY MOUTH EVERY DAY   meloxicam  (MOBIC ) 15 MG tablet Take 1 tablet (15 mg total) by mouth daily as needed for pain.   meloxicam  (MOBIC ) 15 MG tablet Take 1 tablet (15 mg total) by mouth daily.   metoprolol  tartrate (LOPRESSOR ) 25 MG tablet TAKE (1) TABLET BY MOUTH TWICE DAILY   rOPINIRole  (REQUIP ) 0.25 MG tablet TAKE ONE TABLET BY MOUTH AT BEDTIME. (Patient taking differently: As Needed)   SUMAtriptan   (IMITREX ) 50 MG tablet TAKE 1 TABLET ONCE AS NEEDED FOR MIGRAINE FOR 1 DOSE...  MAY REPEAT A SECOND AFTER 2 HOURS IF NEEDED...   traZODone  (DESYREL ) 50 MG tablet Take 0.5-1 tablets (25-50 mg total) by mouth at bedtime as needed for sleep.   triamcinolone  ointment (KENALOG ) 0.5 % Apply 1 Application topically 2 (two) times daily.   VITAMIN D  PO Take by mouth daily.   No facility-administered encounter medications on file as of 07/12/2024.    BP 120/70   Pulse 76   Temp 97.8 F (36.6 C) (Oral)   Ht 5' 5 (1.651 m)   Wt 214 lb 8 oz (97.3 kg)   SpO2 99%   BMI 35.69 kg/m  BP Readings from Last 3 Encounters:  07/12/24 120/70  03/22/24 119/78  02/23/24 118/74    Physical Exam Constitutional:      Appearance: Normal appearance.  Eyes:     Extraocular Movements: Extraocular movements intact.     Pupils: Pupils are equal, round, and reactive to light.  Cardiovascular:     Rate and Rhythm: Normal rate and regular rhythm.     Pulses: Normal pulses.     Heart sounds: No murmur heard.    No friction rub. No gallop.  Pulmonary:     Effort: Pulmonary effort is normal. No respiratory distress.     Breath sounds: Normal breath sounds. No wheezing.  Comments: No crackles Abdominal:     General: Abdomen is flat. Bowel sounds are normal.     Palpations: Abdomen is soft.  Musculoskeletal:        General: Normal range of motion.     Cervical back: Normal range of motion. No tenderness.     Comments: TTP of the left lower lateral ribs   Skin:    General: Skin is warm and dry.     Findings: No bruising.  Neurological:     General: No focal deficit present.     Mental Status: She is alert. Mental status is at baseline.        03/22/2024    8:31 AM 02/23/2024   11:47 AM 12/14/2023   10:09 AM  Depression screen PHQ 2/9  Decreased Interest 1 0 0  Down, Depressed, Hopeless 1 0 0  PHQ - 2 Score 2 0 0  Altered sleeping 3 0 0  Tired, decreased energy 3 2 1   Change in appetite 3 1 2    Feeling bad or failure about yourself  0 0 0  Trouble concentrating 3 2 0  Moving slowly or fidgety/restless 0 0 0  Suicidal thoughts 0 0 0  PHQ-9 Score 14  5  3    Difficult doing work/chores  Somewhat difficult Not difficult at all     Data saved with a previous flowsheet row definition       03/22/2024    8:31 AM 02/23/2024   11:48 AM 12/14/2023   10:09 AM 07/20/2023    1:24 PM  GAD 7 : Generalized Anxiety Score  Nervous, Anxious, on Edge 1  0  0  0   Control/stop worrying 1  0  0  0   Worry too much - different things 0  0  0  0   Trouble relaxing 0  0  0  0   Restless 0  0  0  0   Easily annoyed or irritable 1  0  0  0   Afraid - awful might happen 0  0  0  0   Total GAD 7 Score 3 0 0 0  Anxiety Difficulty  Not difficult at all Not difficult at all Not difficult at all     Data saved with a previous flowsheet row definition    No results found for any visits on 07/12/24.    The 10-year ASCVD risk score (Arnett DK, et al., 2019) is: 0.7%    Assessment & Plan:  Rib pain Likely MSK strain possibly due to laughing and coughing harder than usual. Pain is reproducible low suspicion for cardiac etiology. Offer imaging to evaluate for fracture which she declined. Conservative treatment with ice, NSAID, rest, bracing, and lidocaine  patches. Return precautions reviewed.    Return if symptoms worsen or fail to improve.    Harlene Saddler, MD "

## 2024-07-12 NOTE — Telephone Encounter (Signed)
 Answer Assessment - Initial Assessment Questions Additional info: Patient reports 2 nights ago she was laughing and then started coughing really strong, she developed left sided rib pain right away. Denies breathing difficulty, inspiration does not make pain worse, denies chest pain and all other symptoms. She reports 0/10 left rib pain when stationary, up to 8/10 pain with movement. Requesting evaluation to rule out/in fracture. Scheduled acute at alternate regional clinic.

## 2024-07-22 NOTE — Patient Instructions (Incomplete)
 Please call to schedule your mammogram and/or bone density: [facility]  Address: [address], [place], [place] [address] Phone: [phone]  [facility] [address] [place],  [place]  [address] Phone: [phone]    Be Involved in Caring For Your Health:  Taking Medications When medications are taken as directed, they can greatly improve your health. But if they are not taken as prescribed, they may not work. In some cases, not taking them correctly can be harmful. To help ensure your treatment remains effective and safe, understand your medications and how to take them. Bring your medications to each visit for review by your provider.  Your lab results, notes, and after visit summary will be available on My Chart. We strongly encourage you to use this feature. If lab results are abnormal the clinic will contact you with the appropriate steps. If the clinic does not contact you assume the results are satisfactory. You can always view your results on My Chart. If you have questions regarding your health or results, please contact the clinic during office hours. You can also ask questions on My Chart.  We at [facility] are grateful that you chose us  to provide your care. We strive to provide evidence-based and compassionate care and are always looking for feedback. If you get a survey from the clinic please complete this so we can hear your opinions.   Healthy Eating for Adults Healthy eating may help you get and keep a healthy body weight, reduce the risk of chronic disease, and live a long and productive life. It is important to follow a healthy eating pattern. Your nutritional and calorie needs should be met mainly by different nutrient-rich foods. What are tips for following this plan? Reading food labels Read labels and choose the following: Reduced or low sodium products. Juices with  100% fruit juice. Foods with low saturated fats (<3 g per serving) and high polyunsaturated and monounsaturated fats. Foods with whole grains, such as whole wheat, cracked wheat, brown rice, and wild rice. Whole grains that are fortified with folic acid. This is recommended for females who are pregnant or who want to become pregnant. Read labels and do not eat or drink the following: Foods or drinks with added sugars. These include foods that contain brown sugar, corn sweetener, corn syrup, dextrose, fructose, glucose, high-fructose corn syrup, honey, invert sugar, lactose, malt syrup, maltose, molasses, raw sugar, sucrose, trehalose, or turbinado sugar. Limit your intake of added sugars to less than 10% of your total daily calories. Do not eat more than the following amounts of added sugar per day: 6 teaspoons (25 g) for females. 9 teaspoons (38 g) for males. Foods that contain processed or refined starches and grains. Refined grain products, such as white flour, degermed cornmeal, white bread, and white rice. Shopping Choose nutrient-rich snacks, such as vegetables, whole fruits, and nuts. Avoid high-calorie and high-sugar snacks, such as potato chips, fruit snacks, and candy. Use oil-based dressings and spreads on foods instead of solid fats such as butter, margarine, sour cream, or cream cheese. Limit pre-made sauces, mixes, and instant products such as flavored rice, instant noodles, and ready-made pasta. Try more plant-protein sources, such as tofu, tempeh, black beans, edamame, lentils, nuts, and seeds. Explore eating plans such as the Mediterranean diet or vegetarian diet. Try heart-healthy dips made with beans and healthy fats like hummus and guacamole. Vegetables go great with these. Cooking  Use oil to saut or stir-fry foods instead of solid fats such as butter, margarine, or lard. Try baking, boiling, grilling, or broiling instead of frying. Remove the fatty part of meats before  cooking. Steam vegetables in water  or broth. Meal planning  At meals, imagine dividing your plate into fourths: One-half of your plate is fruits and vegetables. One-fourth of your plate is whole grains. One-fourth of your plate is protein, especially lean meats, poultry, eggs, tofu, beans, or nuts. Include low-fat dairy as part of your daily diet. Lifestyle Choose healthy options in all settings, including home, work, school, restaurants, or stores. Prepare your food safely: Wash your hands after handling raw meats. Where you prepare food, keep surfaces clean by regularly washing with hot, soapy water . Keep raw meats separate from ready-to-eat foods, such as fruits and vegetables. Cook seafood, meat, poultry, and eggs to the recommended temperature. Get a food thermometer. Store foods at safe temperatures. In general: Keep cold foods at 53F (4.4C) or below. Keep hot foods at 153F (60C) or above. Keep your freezer at [facility] (-17.8C) or below. Foods are not safe to eat if they have been between the temperatures of 40-153F (4.4-60C) for more than 2 hours. What foods should I eat? Fruits Aim to eat 1-2 cups of fresh, canned (in natural juice), or frozen fruits each day. One cup of fruit equals 1 small apple, 1 large banana, 8 large strawberries, 1 cup (237 g) canned fruit,  cup (82 g) dried fruit, or 1 cup (240 mL) 100% juice. Vegetables Aim to eat 2-4 cups of fresh and frozen vegetables each day, including different varieties and colors. One cup of vegetables equals 1 cup (91 g) broccoli or cauliflower florets, 2 medium carrots, 2 cups (150 g) raw, leafy greens, 1 large tomato, 1 large bell pepper, 1 large sweet potato, or 1 medium white potato. Grains Aim to eat 5-10 ounce-equivalents of whole grains each day. Examples of 1 ounce-equivalent of grains include 1 slice of bread, 1 cup (40 g) ready-to-eat cereal, 3 cups (24 g) popcorn, or  cup (93 g) cooked rice. Meats and other  proteins Try to eat 5-7 ounce-equivalents of protein each day. Examples of 1 ounce-equivalent of protein include 1 egg,  oz nuts (12 almonds, 24 pistachios, or 7 walnut halves), 1/4 cup (90 g) cooked beans, 6 tablespoons (90 g) hummus or 1 tablespoon (16 g) peanut butter. A cut of meat or fish that is the size of a deck of cards is about 3-4 ounce-equivalents (85 g). Of the protein you eat each week, try to have at least 8 sounce (227 g) of seafood. This is about 2 servings per week. This includes salmon, trout, herring, sardines, and anchovies. Dairy Aim to eat 3 cup-equivalents of fat-free or low-fat dairy each day. Examples of 1 cup-equivalent of dairy include 1 cup (240 mL) milk, 8 ounces (250 g) yogurt, 1 ounces (44 g) natural cheese, or 1 cup (240 mL) fortified soy milk. Fats and oils Aim for about 5 teaspoons (21 g) of fats and oils per day. Choose monounsaturated fats, such as canola and olive oils, mayonnaise made with olive oil or avocado oil, avocados, peanut butter, and most nuts, or polyunsaturated fats, such as sunflower, corn, and soybean oils, walnuts, pine nuts, sesame seeds, sunflower seeds, and flaxseed. Beverages Aim for 6 eight-ounce glasses of water  per day. Limit coffee to 3-5 eight-ounce cups per day. Limit caffeinated beverages that have added calories, such as soda and energy drinks. If  you drink alcohol: Limit how much you have to: 0-1 drink a day if you are female. 0-2 drinks a day if you are female. Know how much alcohol is in your drink. In the [place] one drink is one 12 oz bottle of beer (355 mL), one 5 oz glass of wine (148 mL), or one 1 oz glass of hard liquor (44 mL). Seasoning and other foods Try not to add too much salt to your food. Try using herbs and spices instead of salt. Try not to add sugar to food. This information is based on [place] nutrition guidelines. To learn more, visit disposablenylon.be. Exact amounts may vary. You may need different amounts. This  information is not intended to replace advice given to you by your health care provider. Make sure you discuss any questions you have with your health care provider. Document Revised: 04/17/2024 Document Reviewed: 03/09/2022 Elsevier Patient Education  2025 [employer].

## 2024-07-25 ENCOUNTER — Ambulatory Visit: Payer: Self-pay | Admitting: Nurse Practitioner

## 2024-07-25 ENCOUNTER — Encounter: Payer: Self-pay | Admitting: Nurse Practitioner

## 2024-07-25 ENCOUNTER — Ambulatory Visit
Admission: RE | Admit: 2024-07-25 | Discharge: 2024-07-25 | Disposition: A | Source: Ambulatory Visit | Attending: Nurse Practitioner | Admitting: Nurse Practitioner

## 2024-07-25 ENCOUNTER — Ambulatory Visit
Admission: RE | Admit: 2024-07-25 | Discharge: 2024-07-25 | Disposition: A | Source: Home / Self Care | Attending: Nurse Practitioner | Admitting: Nurse Practitioner

## 2024-07-25 VITALS — BP 128/75 | HR 89 | Temp 97.9°F | Ht 65.0 in | Wt 213.6 lb

## 2024-07-25 DIAGNOSIS — R0981 Nasal congestion: Secondary | ICD-10-CM | POA: Insufficient documentation

## 2024-07-25 DIAGNOSIS — G43829 Menstrual migraine, not intractable, without status migrainosus: Secondary | ICD-10-CM

## 2024-07-25 DIAGNOSIS — E66812 Obesity, class 2: Secondary | ICD-10-CM

## 2024-07-25 DIAGNOSIS — F5104 Psychophysiologic insomnia: Secondary | ICD-10-CM

## 2024-07-25 DIAGNOSIS — F325 Major depressive disorder, single episode, in full remission: Secondary | ICD-10-CM

## 2024-07-25 DIAGNOSIS — L739 Follicular disorder, unspecified: Secondary | ICD-10-CM

## 2024-07-25 DIAGNOSIS — Z0000 Encounter for general adult medical examination without abnormal findings: Secondary | ICD-10-CM

## 2024-07-25 DIAGNOSIS — K219 Gastro-esophageal reflux disease without esophagitis: Secondary | ICD-10-CM

## 2024-07-25 DIAGNOSIS — E782 Mixed hyperlipidemia: Secondary | ICD-10-CM

## 2024-07-25 DIAGNOSIS — R0789 Other chest pain: Secondary | ICD-10-CM | POA: Insufficient documentation

## 2024-07-25 DIAGNOSIS — Z803 Family history of malignant neoplasm of breast: Secondary | ICD-10-CM

## 2024-07-25 DIAGNOSIS — Z1231 Encounter for screening mammogram for malignant neoplasm of breast: Secondary | ICD-10-CM

## 2024-07-25 DIAGNOSIS — E559 Vitamin D deficiency, unspecified: Secondary | ICD-10-CM

## 2024-07-25 DIAGNOSIS — G2581 Restless legs syndrome: Secondary | ICD-10-CM

## 2024-07-25 DIAGNOSIS — R7301 Impaired fasting glucose: Secondary | ICD-10-CM

## 2024-07-25 LAB — POC COVID19 BINAXNOW: SARS Coronavirus 2 Ag: NEGATIVE

## 2024-07-25 LAB — VERITOR FLU A/B WAIVED
Influenza A: NEGATIVE
Influenza B: NEGATIVE

## 2024-07-25 MED ORDER — METOPROLOL TARTRATE 25 MG PO TABS: ORAL_TABLET | ORAL | 4 refills | Status: AC

## 2024-07-25 MED ORDER — TRAZODONE HCL 50 MG PO TABS: 25.0000 mg | ORAL_TABLET | Freq: Every evening | ORAL | 3 refills | Status: AC | PRN

## 2024-07-25 MED ORDER — DULOXETINE HCL 60 MG PO CPEP: ORAL_CAPSULE | ORAL | 4 refills | Status: AC

## 2024-07-25 NOTE — Assessment & Plan Note (Signed)
 Chronic, stable. Continue Trazodone  25-50 MG at bedtime PRN for sleep.  Educated on this medication and plan.  Recommend: ?Maintain a regular sleep schedule, particularly a regular wake-up time in the morning ?Try not to force sleep ?Avoid caffeinated beverages after lunch ?Avoid alcohol near bedtime (eg, late afternoon and evening)  ?Avoid smoking or other nicotine intake, particularly during the evening ?Adjust the bedroom environment as needed to decrease stimuli (eg, reduce ambient light, turn off the television or radio) ?Avoid prolonged use of light-emitting screens (laptops, tablets, smartphones, ebooks) before bedtime  ?Resolve concerns or worries before bedtime ?Exercise regularly for at least 20 minutes, preferably more than four to five hours prior to bedtime  ?Avoid daytime naps, especially if they are longer than 20 to 30 minutes or occur late in the day

## 2024-07-25 NOTE — Progress Notes (Signed)
 Contacted via MyChart  So good news, no healing or current rib fractures noted. Suspect you pulled muscle, which as we discussed can happen if coughing hard or laughing hard. I would recommend monitoring. You can let you mom know there was no fracture present. Have a great evening.

## 2024-07-25 NOTE — Assessment & Plan Note (Signed)
BMI 35.54. Recommended eating smaller high protein, low fat meals more frequently and exercising 30 mins a day 5 times a week with a goal of 10-15lb weight loss in the next 3 months. Patient voiced their understanding and motivation to adhere to these recommendations.

## 2024-07-25 NOTE — Assessment & Plan Note (Signed)
 Chronic, ongoing.  Denies SI/HI. Following with psychiatry, will continue this collaboration. Duloxetine  for mood and migraines. Klonopin filled by psychiatry.

## 2024-07-25 NOTE — Assessment & Plan Note (Signed)
 Chronic.  Noted on past labs with ASCVD 0.8%.  Continue diet and exercise focus, lipid panel at today.

## 2024-07-25 NOTE — Assessment & Plan Note (Signed)
 Chronic, improved.  Has not used medication in some time for this.

## 2024-07-25 NOTE — Assessment & Plan Note (Signed)
 Areas currently healed over. Recommend monitoring closely. Use Hibiclens to clean areas and Mupirocin  as needed. Recommend she follow-up with dermatology for further assessment and recommendations.

## 2024-07-25 NOTE — Assessment & Plan Note (Signed)
 Under left breast, was told this was a rib fracture which concerned her as nothing to provoke this. Before further assessing for bone health issues will obtain XR imaging to ensure fracture was present. ?more muscle related vs fracture. If fracture could consider DEXA imaging to assess bone health.

## 2024-07-25 NOTE — Assessment & Plan Note (Signed)
 Noted on past labs, check A1c today. ?

## 2024-07-25 NOTE — Assessment & Plan Note (Signed)
 Noted on past labs, continue supplement and adjust as needed.

## 2024-07-25 NOTE — Assessment & Plan Note (Signed)
 Chronic, stable.  Continue Metoprolol + Imitrex for acute episodes only.  Minimally using medication at this time, is diet focused.  Refills as needed.  Labs today.

## 2024-07-26 LAB — CBC WITH DIFFERENTIAL/PLATELET
Basophils Absolute: 0.1 10*3/uL (ref 0.0–0.2)
Basos: 1 %
EOS (ABSOLUTE): 0.5 10*3/uL — ABNORMAL HIGH (ref 0.0–0.4)
Eos: 8 %
Hematocrit: 37.6 % (ref 34.0–46.6)
Hemoglobin: 12.4 g/dL (ref 11.1–15.9)
Immature Grans (Abs): 0 10*3/uL (ref 0.0–0.1)
Immature Granulocytes: 0 %
Lymphocytes Absolute: 1.1 10*3/uL (ref 0.7–3.1)
Lymphs: 19 %
MCH: 29.2 pg (ref 26.6–33.0)
MCHC: 33 g/dL (ref 31.5–35.7)
MCV: 89 fL (ref 79–97)
Monocytes Absolute: 0.7 10*3/uL (ref 0.1–0.9)
Monocytes: 11 %
Neutrophils Absolute: 3.5 10*3/uL (ref 1.4–7.0)
Neutrophils: 61 %
Platelets: 270 10*3/uL (ref 150–450)
RBC: 4.24 x10E6/uL (ref 3.77–5.28)
RDW: 13 % (ref 11.7–15.4)
WBC: 5.8 10*3/uL (ref 3.4–10.8)

## 2024-07-26 LAB — LIPID PANEL W/O CHOL/HDL RATIO
Cholesterol, Total: 243 mg/dL — ABNORMAL HIGH (ref 100–199)
HDL: 56 mg/dL
LDL Chol Calc (NIH): 123 mg/dL — ABNORMAL HIGH (ref 0–99)
Triglycerides: 364 mg/dL — ABNORMAL HIGH (ref 0–149)
VLDL Cholesterol Cal: 64 mg/dL — ABNORMAL HIGH (ref 5–40)

## 2024-07-26 LAB — COMPREHENSIVE METABOLIC PANEL WITH GFR
ALT: 17 [IU]/L (ref 0–32)
AST: 19 [IU]/L (ref 0–40)
Albumin: 4.4 g/dL (ref 3.9–4.9)
Alkaline Phosphatase: 97 [IU]/L (ref 41–116)
BUN/Creatinine Ratio: 13 (ref 9–23)
BUN: 10 mg/dL (ref 6–24)
Bilirubin Total: <0.2 mg/dL (ref 0.0–1.2)
CO2: 25 mmol/L (ref 20–29)
Calcium: 9.4 mg/dL (ref 8.7–10.2)
Chloride: 101 mmol/L (ref 96–106)
Creatinine, Ser: 0.77 mg/dL (ref 0.57–1.00)
Globulin, Total: 2.5 g/dL (ref 1.5–4.5)
Glucose: 92 mg/dL (ref 70–99)
Potassium: 4.1 mmol/L (ref 3.5–5.2)
Sodium: 141 mmol/L (ref 134–144)
Total Protein: 6.9 g/dL (ref 6.0–8.5)
eGFR: 95 mL/min/{1.73_m2}

## 2024-07-26 LAB — TSH: TSH: 2.9 u[IU]/mL (ref 0.450–4.500)

## 2024-07-26 LAB — HEMOGLOBIN A1C
Est. average glucose Bld gHb Est-mCnc: 111 mg/dL
Hgb A1c MFr Bld: 5.5 % (ref 4.8–5.6)

## 2024-07-26 LAB — VITAMIN D 25 HYDROXY (VIT D DEFICIENCY, FRACTURES): Vit D, 25-Hydroxy: 40.8 ng/mL (ref 30.0–100.0)

## 2024-07-26 NOTE — Progress Notes (Signed)
 Contacted via MyChart  Good morning Tammy Stout, your labs have returned and overall remain stable with exception of lipid panel, however lipid panel levels are showing trend down. Continue to focus on healthy diet changes and regular exercise. Any questions? Keep being stellar!!  Thank you for allowing me to participate in your care.  I appreciate you. Kindest regards, Christe Tellez

## 2025-07-25 ENCOUNTER — Ambulatory Visit: Admitting: Nurse Practitioner
